# Patient Record
Sex: Male | Born: 1975 | Race: White | Hispanic: No | Marital: Single | Smoking: Former smoker
Health system: Southern US, Community
[De-identification: ages and names within clinical notes are randomized; demographics above are authoritative.]

## PROBLEM LIST (undated history)

## (undated) DIAGNOSIS — F32A Depression, unspecified: Secondary | ICD-10-CM

## (undated) DIAGNOSIS — F419 Anxiety disorder, unspecified: Secondary | ICD-10-CM

## (undated) DIAGNOSIS — J45909 Unspecified asthma, uncomplicated: Secondary | ICD-10-CM

## (undated) DIAGNOSIS — C801 Malignant (primary) neoplasm, unspecified: Secondary | ICD-10-CM

## (undated) DIAGNOSIS — J392 Other diseases of pharynx: Secondary | ICD-10-CM

## (undated) HISTORY — PX: TONSILLECTOMY: SUR1361

## (undated) HISTORY — PX: HIP SURGERY: SHX245

## (undated) HISTORY — PX: ELBOW SURGERY: SHX618

## (undated) HISTORY — PX: SHOULDER SURGERY: SHX246

## (undated) HISTORY — PX: PLANTAR FASCIA SURGERY: SHX746

---
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
## 2016-08-22 ENCOUNTER — Emergency Department (HOSPITAL_COMMUNITY): Payer: Commercial Managed Care - PPO

## 2016-08-22 ENCOUNTER — Encounter (HOSPITAL_COMMUNITY): Payer: Self-pay

## 2016-08-22 ENCOUNTER — Inpatient Hospital Stay (HOSPITAL_COMMUNITY)
Admission: EM | Admit: 2016-08-22 | Discharge: 2016-08-25 | DRG: 083 | Disposition: A | Discharge status: Home / Self Care | Payer: Commercial Managed Care - PPO | Attending: Surgery | Admitting: Surgery

## 2016-08-22 DIAGNOSIS — S0101XA Laceration without foreign body of scalp, initial encounter: Secondary | ICD-10-CM | POA: Diagnosis present

## 2016-08-22 DIAGNOSIS — S42112A Displaced fracture of body of scapula, left shoulder, initial encounter for closed fracture: Secondary | ICD-10-CM | POA: Diagnosis present

## 2016-08-22 DIAGNOSIS — S40212A Abrasion of left shoulder, initial encounter: Secondary | ICD-10-CM | POA: Diagnosis present

## 2016-08-22 DIAGNOSIS — Y908 Blood alcohol level of 240 mg/100 ml or more: Secondary | ICD-10-CM | POA: Diagnosis present

## 2016-08-22 DIAGNOSIS — R402412 Glasgow coma scale score 13-15, at arrival to emergency department: Secondary | ICD-10-CM | POA: Diagnosis present

## 2016-08-22 DIAGNOSIS — F10229 Alcohol dependence with intoxication, unspecified: Secondary | ICD-10-CM | POA: Diagnosis present

## 2016-08-22 DIAGNOSIS — W134XXA Fall from, out of or through window, initial encounter: Secondary | ICD-10-CM | POA: Diagnosis present

## 2016-08-22 DIAGNOSIS — S06339A Contusion and laceration of cerebrum, unspecified, with loss of consciousness of unspecified duration, initial encounter: Secondary | ICD-10-CM | POA: Diagnosis present

## 2016-08-22 DIAGNOSIS — F10129 Alcohol abuse with intoxication, unspecified: Secondary | ICD-10-CM | POA: Diagnosis present

## 2016-08-22 DIAGNOSIS — Z79899 Other long term (current) drug therapy: Secondary | ICD-10-CM | POA: Diagnosis not present

## 2016-08-22 DIAGNOSIS — R52 Pain, unspecified: Secondary | ICD-10-CM | POA: Diagnosis present

## 2016-08-22 DIAGNOSIS — E872 Acidosis, unspecified: Secondary | ICD-10-CM

## 2016-08-22 DIAGNOSIS — F10929 Alcohol use, unspecified with intoxication, unspecified: Secondary | ICD-10-CM

## 2016-08-22 DIAGNOSIS — S42102A Fracture of unspecified part of scapula, left shoulder, initial encounter for closed fracture: Secondary | ICD-10-CM

## 2016-08-22 DIAGNOSIS — F17210 Nicotine dependence, cigarettes, uncomplicated: Secondary | ICD-10-CM | POA: Diagnosis present

## 2016-08-22 DIAGNOSIS — S0633AA Contusion and laceration of cerebrum, unspecified, with loss of consciousness status unknown, initial encounter: Secondary | ICD-10-CM | POA: Diagnosis present

## 2016-08-22 DIAGNOSIS — F1290 Cannabis use, unspecified, uncomplicated: Secondary | ICD-10-CM | POA: Diagnosis not present

## 2016-08-22 DIAGNOSIS — Z79891 Long term (current) use of opiate analgesic: Secondary | ICD-10-CM | POA: Diagnosis not present

## 2016-08-22 DIAGNOSIS — S0990XA Unspecified injury of head, initial encounter: Secondary | ICD-10-CM

## 2016-08-22 DIAGNOSIS — S06361A Traumatic hemorrhage of cerebrum, unspecified, with loss of consciousness of 30 minutes or less, initial encounter: Secondary | ICD-10-CM

## 2016-08-22 HISTORY — DX: Anxiety disorder, unspecified: F41.9

## 2016-08-22 HISTORY — DX: Unspecified asthma, uncomplicated: J45.909

## 2016-08-22 LAB — I-STAT CG4 LACTIC ACID, ED: LACTIC ACID, VENOUS: 4.25 mmol/L — AB (ref 0.5–1.9)

## 2016-08-22 LAB — CBC
HCT: 45.8 % (ref 39.0–52.0)
Hemoglobin: 16.5 g/dL (ref 13.0–17.0)
MCH: 34.6 pg — ABNORMAL HIGH (ref 26.0–34.0)
MCHC: 36 g/dL (ref 30.0–36.0)
MCV: 96 fL (ref 78.0–100.0)
PLATELETS: 118 10*3/uL — AB (ref 150–400)
RBC: 4.77 MIL/uL (ref 4.22–5.81)
RDW: 12.9 % (ref 11.5–15.5)
WBC: 4 10*3/uL (ref 4.0–10.5)

## 2016-08-22 LAB — COMPREHENSIVE METABOLIC PANEL
ALT: 136 U/L — AB (ref 17–63)
AST: 157 U/L — ABNORMAL HIGH (ref 15–41)
Albumin: 4.7 g/dL (ref 3.5–5.0)
Alkaline Phosphatase: 56 U/L (ref 38–126)
Anion gap: 16 — ABNORMAL HIGH (ref 5–15)
BUN: <5 mg/dL — ABNORMAL LOW (ref 6–20)
CALCIUM: 8.9 mg/dL (ref 8.9–10.3)
CHLORIDE: 100 mmol/L — AB (ref 101–111)
CO2: 24 mmol/L (ref 22–32)
CREATININE: 0.85 mg/dL (ref 0.61–1.24)
GFR calc non Af Amer: >60 mL/min (ref >60)
Glucose, Bld: 130 mg/dL — ABNORMAL HIGH (ref 65–99)
Potassium: 3.6 mmol/L (ref 3.5–5.1)
Sodium: 140 mmol/L (ref 135–145)
TOTAL PROTEIN: 7.2 g/dL (ref 6.5–8.1)
Total Bilirubin: 0.3 mg/dL (ref 0.3–1.2)

## 2016-08-22 LAB — I-STAT CHEM 8, ED
BUN: <3 mg/dL — ABNORMAL LOW (ref 6–20)
Calcium, Ion: 0.93 mmol/L — ABNORMAL LOW (ref 1.15–1.40)
Chloride: 99 mmol/L — ABNORMAL LOW (ref 101–111)
Creatinine, Ser: 1.3 mg/dL — ABNORMAL HIGH (ref 0.61–1.24)
GLUCOSE: 135 mg/dL — AB (ref 65–99)
HCT: 48 % (ref 39.0–52.0)
HEMOGLOBIN: 16.3 g/dL (ref 13.0–17.0)
POTASSIUM: 3.4 mmol/L — AB (ref 3.5–5.1)
Sodium: 139 mmol/L (ref 135–145)
TCO2: 25 mmol/L (ref 0–100)

## 2016-08-22 LAB — SAMPLE TO BLOOD BANK

## 2016-08-22 LAB — PROTIME-INR
INR: 0.89
PROTHROMBIN TIME: 12 s (ref 11.4–15.2)

## 2016-08-22 LAB — ETHANOL: ALCOHOL ETHYL (B): 392 mg/dL — AB (ref <5)

## 2016-08-22 MED ORDER — LORAZEPAM 2 MG/ML IJ SOLN
0.0000 mg | Freq: Two times a day (BID) | INTRAMUSCULAR | Status: DC
Start: 2016-08-25 — End: 2016-08-25

## 2016-08-22 MED ORDER — TETANUS-DIPHTH-ACELL PERTUSSIS 5-2.5-18.5 LF-MCG/0.5 IM SUSP
0.5000 mL | Freq: Once | INTRAMUSCULAR | Status: AC
  Administered 2016-08-22: 0.5 mL via INTRAMUSCULAR
  Filled 2016-08-22: qty 0.5

## 2016-08-22 MED ORDER — PANTOPRAZOLE SODIUM 40 MG PO TBEC
40.0000 mg | DELAYED_RELEASE_TABLET | Freq: Every day | ORAL | Status: DC
  Administered 2016-08-23 – 2016-08-25 (×3): 40 mg via ORAL
  Filled 2016-08-22 (×3): qty 1

## 2016-08-22 MED ORDER — ADULT MULTIVITAMIN W/MINERALS CH
1.0000 | ORAL_TABLET | Freq: Every day | ORAL | Status: DC
  Administered 2016-08-23 – 2016-08-25 (×3): 1 via ORAL
  Filled 2016-08-22 (×3): qty 1

## 2016-08-22 MED ORDER — ONDANSETRON HCL 4 MG PO TABS: 4.0000 mg | ORAL_TABLET | Freq: Four times a day (QID) | ORAL | Status: DC | PRN

## 2016-08-22 MED ORDER — POTASSIUM CHLORIDE IN NACL 20-0.9 MEQ/L-% IV SOLN
INTRAVENOUS | Status: DC
  Administered 2016-08-23 – 2016-08-24 (×5): via INTRAVENOUS
  Administered 2016-08-24: 100 mL/h via INTRAVENOUS
  Filled 2016-08-22 (×5): qty 1000

## 2016-08-22 MED ORDER — VITAMIN B-1 100 MG PO TABS
100.0000 mg | ORAL_TABLET | Freq: Every day | ORAL | Status: DC
  Administered 2016-08-23 – 2016-08-25 (×3): 100 mg via ORAL
  Filled 2016-08-22 (×3): qty 1

## 2016-08-22 MED ORDER — PANTOPRAZOLE SODIUM 40 MG IV SOLR: 40.0000 mg | Freq: Every day | INTRAVENOUS | Status: DC

## 2016-08-22 MED ORDER — LORAZEPAM 1 MG PO TABS
1.0000 mg | ORAL_TABLET | Freq: Four times a day (QID) | ORAL | Status: DC | PRN
  Administered 2016-08-25: 1 mg via ORAL
  Filled 2016-08-22: qty 1

## 2016-08-22 MED ORDER — ONDANSETRON HCL 4 MG/2ML IJ SOLN
4.0000 mg | Freq: Four times a day (QID) | INTRAMUSCULAR | Status: DC | PRN
  Administered 2016-08-23 (×2): 4 mg via INTRAVENOUS
  Filled 2016-08-22 (×2): qty 2

## 2016-08-22 MED ORDER — LORAZEPAM 2 MG/ML IJ SOLN
0.0000 mg | Freq: Four times a day (QID) | INTRAMUSCULAR | Status: AC
Start: 2016-08-23 — End: 2016-08-24

## 2016-08-22 MED ORDER — FOLIC ACID 1 MG PO TABS
1.0000 mg | ORAL_TABLET | Freq: Every day | ORAL | Status: DC
  Administered 2016-08-23 – 2016-08-25 (×3): 1 mg via ORAL
  Filled 2016-08-22 (×3): qty 1

## 2016-08-22 MED ORDER — LORAZEPAM 2 MG/ML IJ SOLN: 1.0000 mg | Freq: Four times a day (QID) | INTRAMUSCULAR | Status: DC | PRN

## 2016-08-22 MED ORDER — SODIUM CHLORIDE 0.9 % IV BOLUS (SEPSIS)
1000.0000 mL | Freq: Once | INTRAVENOUS | Status: AC
  Administered 2016-08-22: 1000 mL via INTRAVENOUS

## 2016-08-22 MED ORDER — ONDANSETRON HCL 4 MG/2ML IJ SOLN
4.0000 mg | Freq: Once | INTRAMUSCULAR | Status: AC
  Administered 2016-08-22: 4 mg via INTRAVENOUS
  Filled 2016-08-22: qty 2

## 2016-08-22 MED ORDER — MORPHINE SULFATE (PF) 4 MG/ML IV SOLN
2.0000 mg | INTRAVENOUS | Status: DC | PRN
  Administered 2016-08-23: 2 mg via INTRAVENOUS
  Administered 2016-08-23: 4 mg via INTRAVENOUS
  Administered 2016-08-23: 2 mg via INTRAVENOUS
  Administered 2016-08-23 (×2): 4 mg via INTRAVENOUS
  Filled 2016-08-22 (×5): qty 1

## 2016-08-22 MED ORDER — THIAMINE HCL 100 MG/ML IJ SOLN: 100.0000 mg | Freq: Every day | INTRAMUSCULAR | Status: DC

## 2016-08-22 NOTE — ED Notes (Signed)
I Stat Lactic Acid results shown to Dr. Jearld Pies.

## 2016-08-22 NOTE — ED Notes (Signed)
Pt taken to CT by this RN and Ecologist.

## 2016-08-22 NOTE — ED Provider Notes (Signed)
[facility] DEPT Provider Note   CSN: [id number] Arrival date & time: 08/22/16  2133     History   Chief Complaint Chief Complaint  Patient presents with  . Fall  . Head Injury    HPI Johnny Mata is a 41 y.o. male.  The history is provided by the patient and the EMS personnel.  Fall  This is a new problem. The current episode started less than 1 hour ago. The problem occurs constantly. The problem has not changed since onset.Associated symptoms include headaches. Pertinent negatives include no chest pain, no abdominal pain and no shortness of breath. Nothing aggravates the symptoms. Nothing relieves the symptoms. He has tried nothing for the symptoms. The treatment provided no relief.  Head Injury   The incident occurred less than 1 hour ago. He came to the ER via EMS. The injury mechanism was a fall. He lost consciousness for a period of 1 to 5 minutes. The volume of blood lost was minimal. The quality of the pain is described as sharp. The pain is at a severity of 7/10. The pain is moderate. The pain has been constant since the injury. Associated symptoms include memory loss. Pertinent negatives include no numbness, no blurred vision, no vomiting, no tinnitus, no disorientation and no weakness. He was found lethargic by EMS personnel. Treatment on the scene included a c-collar and a backboard. He has tried nothing for the symptoms.    History reviewed. No pertinent past medical history.  Patient Active Problem List   Diagnosis Date Noted  . Cerebral contusion ([place]) 08/22/2016    History reviewed. No pertinent surgical history.     Home Medications    Prior to Admission medications   Medication Sig Start Date End Date Taking? Authorizing Provider  Diphenhydramine-APAP, sleep, ([facility] PM) 38-500 MG PACK Take 1 packet by mouth See admin instructions. Take 1 packet by mouth upon arrival home at night, then take another packet about an hour later   Yes Historical Provider,  MD    Family History History reviewed. No pertinent family history.  Social History Social History  Substance Use Topics  . Smoking status: Current Every Day Smoker    Types: Cigarettes  . Smokeless tobacco: Not on file  . Alcohol use Yes     Comment: EVERY DAY     Allergies   Patient has no known allergies.   Review of Systems Review of Systems  Constitutional: Negative for chills and fever.  HENT: Negative for ear pain, sore throat and tinnitus.   Eyes: Negative for blurred vision, pain and visual disturbance.  Respiratory: Negative for cough and shortness of breath.   Cardiovascular: Negative for chest pain and palpitations.  Gastrointestinal: Negative for abdominal pain and vomiting.  Genitourinary: Negative for dysuria and hematuria.  Musculoskeletal: Positive for arthralgias and myalgias. Negative for back pain.  Skin: Positive for wound. Negative for color change and rash.  Neurological: Positive for headaches. Negative for seizures, syncope, weakness and numbness.  Psychiatric/Behavioral: Positive for memory loss.  All other systems reviewed and are negative.    Physical Exam Updated Vital Signs BP 127/85   Pulse (!) 110   Temp 97.4 F (36.3 C) (Oral)   Resp 13   Ht 5' 11.75" (1.822 m)   Wt 74.8 kg   SpO2 96%   BMI 22.53 kg/m   Physical Exam  Constitutional: He appears well-developed and well-nourished.  HENT:  Head: Normocephalic. Head is with laceration.    Right Ear: No  hemotympanum.  Left Ear: No hemotympanum.  Eyes: Conjunctivae are normal.  Cardiovascular: Normal rate and regular rhythm.   No murmur heard. Pulmonary/Chest: Effort normal and breath sounds normal. No respiratory distress.  Abdominal: Soft. There is no tenderness.  Musculoskeletal: He exhibits no edema.       Left shoulder: He exhibits decreased range of motion, tenderness, bony tenderness, laceration (abrasion to posterior shoulder) and pain. He exhibits no swelling and no  effusion.       Cervical back: He exhibits no tenderness, no bony tenderness and no pain.       Thoracic back: He exhibits no tenderness, no bony tenderness and no pain.       Lumbar back: He exhibits no tenderness, no bony tenderness and no pain.  Neurological: He is alert. He has normal strength. No cranial nerve deficit or sensory deficit. GCS eye subscore is 4. GCS verbal subscore is 5. GCS motor subscore is 6.  Skin: Skin is warm and dry. Abrasion noted.  Psychiatric: He has a normal mood and affect. His speech is normal. He exhibits abnormal recent memory.  Nursing note and vitals reviewed.    ED Treatments / Results  Labs (all labs ordered are listed, but only abnormal results are displayed) Labs Reviewed  COMPREHENSIVE METABOLIC PANEL - Abnormal; Notable for the following:       Result Value   Chloride 100 (*)    Glucose, Bld 130 (*)    BUN <5 (*)    AST 157 (*)    ALT 136 (*)    Anion gap 16 (*)    All other components within normal limits  CBC - Abnormal; Notable for the following:    MCH 34.6 (*)    Platelets 118 (*)    All other components within normal limits  ETHANOL - Abnormal; Notable for the following:    Alcohol, Ethyl (B) 392 (*)    All other components within normal limits  I-STAT CHEM 8, ED - Abnormal; Notable for the following:    Potassium 3.4 (*)    Chloride 99 (*)    BUN <3 (*)    Creatinine, Ser 1.30 (*)    Glucose, Bld 135 (*)    Calcium, Ion 0.93 (*)    All other components within normal limits  I-STAT CG4 LACTIC ACID, ED - Abnormal; Notable for the following:    Lactic Acid, Venous 4.25 (*)    All other components within normal limits  PROTIME-INR  CDS SEROLOGY  URINALYSIS, ROUTINE W REFLEX MICROSCOPIC  HIV ANTIBODY (ROUTINE TESTING)  BASIC METABOLIC PANEL  CBC  PROTIME-INR  SAMPLE TO BLOOD BANK    EKG  EKG Interpretation None       Radiology Ct Head Wo Contrast  Result Date: 08/22/2016 CLINICAL DATA:  Fall out of window 15  feet onto head, loss of consciousness EXAM: CT HEAD WITHOUT CONTRAST CT CERVICAL SPINE WITHOUT CONTRAST TECHNIQUE: Multidetector CT imaging of the head and cervical spine was performed following the standard protocol without intravenous contrast. Multiplanar CT image reconstructions of the cervical spine were also generated. COMPARISON:  None. FINDINGS: CT HEAD FINDINGS Brain: 7 mm subcortical hemorrhage in the medial right frontal lobe (series 3/image 24), worrisome for parenchymal contusion. No evidence of acute infarction, hydrocephalus, extra-axial collection or mass lesion/mass effect. Vascular: No hyperdense vessel or unexpected calcification. Skull: Normal. Negative for fracture or focal lesion. Sinuses/Orbits: Partial opacification of the right maxillary sinus. Visualized paranasal sinuses and mastoid air cells are otherwise  clear. Other: Soft tissue swelling/hematoma/laceration overlying the left parietal bone (series 3/ image 21). CT CERVICAL SPINE FINDINGS Alignment: Normal cervical lordosis. Skull base and vertebrae: No acute fracture. No primary bone lesion or focal pathologic process. Soft tissues and spinal canal: No prevertebral fluid or swelling. No visible canal hematoma. Disc levels:  Mild degenerative changes. Spinal canal is patent. Upper chest: Visualized lung apices are clear. Other: Visualized thyroid is unremarkable. IMPRESSION: 7 mm subcortical hemorrhage in the medial right frontal lobe, worrisome for parenchymal contusion. Soft tissue swelling/ hematoma/laceration overlying the left parietal bone. No evidence of calvarial fracture. No evidence of acute traumatic injury to the cervical spine. Critical Value/emergent results were called by telephone at the time of interpretation on 08/22/2016 at 10:32 pm to Dr. Duffy Bruce , who verbally acknowledged these results. Electronically Signed   By: Julian Hy M.D.   On: 08/22/2016 22:35   Ct Cervical Spine Wo Contrast  Result Date:  08/22/2016 CLINICAL DATA:  Fall out of window 15 feet onto head, loss of consciousness EXAM: CT HEAD WITHOUT CONTRAST CT CERVICAL SPINE WITHOUT CONTRAST TECHNIQUE: Multidetector CT imaging of the head and cervical spine was performed following the standard protocol without intravenous contrast. Multiplanar CT image reconstructions of the cervical spine were also generated. COMPARISON:  None. FINDINGS: CT HEAD FINDINGS Brain: 7 mm subcortical hemorrhage in the medial right frontal lobe (series 3/image 24), worrisome for parenchymal contusion. No evidence of acute infarction, hydrocephalus, extra-axial collection or mass lesion/mass effect. Vascular: No hyperdense vessel or unexpected calcification. Skull: Normal. Negative for fracture or focal lesion. Sinuses/Orbits: Partial opacification of the right maxillary sinus. Visualized paranasal sinuses and mastoid air cells are otherwise clear. Other: Soft tissue swelling/hematoma/laceration overlying the left parietal bone (series 3/ image 21). CT CERVICAL SPINE FINDINGS Alignment: Normal cervical lordosis. Skull base and vertebrae: No acute fracture. No primary bone lesion or focal pathologic process. Soft tissues and spinal canal: No prevertebral fluid or swelling. No visible canal hematoma. Disc levels:  Mild degenerative changes. Spinal canal is patent. Upper chest: Visualized lung apices are clear. Other: Visualized thyroid is unremarkable. IMPRESSION: 7 mm subcortical hemorrhage in the medial right frontal lobe, worrisome for parenchymal contusion. Soft tissue swelling/ hematoma/laceration overlying the left parietal bone. No evidence of calvarial fracture. No evidence of acute traumatic injury to the cervical spine. Critical Value/emergent results were called by telephone at the time of interpretation on 08/22/2016 at 10:32 pm to Dr. Duffy Bruce , who verbally acknowledged these results. Electronically Signed   By: Julian Hy M.D.   On: 08/22/2016 22:35    Dg Chest Portable 1 View  Result Date: 08/22/2016 CLINICAL DATA:  Status post fall, left shoulder pain EXAM: PORTABLE CHEST 1 VIEW COMPARISON:  None. FINDINGS: Lungs are clear.  No pleural effusion or pneumothorax. The heart is normal size. IMPRESSION: No evidence of acute cardiopulmonary disease. Electronically Signed   By: Julian Hy M.D.   On: 08/22/2016 22:07   Dg Shoulder Left Port  Result Date: 08/22/2016 CLINICAL DATA:  Status post fall, left shoulder pain EXAM: LEFT SHOULDER - 1 VIEW COMPARISON:  None. FINDINGS: Left shoulder is within normal limits. Mild displaced inferior scapular fracture. Visualized left lung is clear. IMPRESSION: Mildly displaced inferior scapular fracture. Electronically Signed   By: Julian Hy M.D.   On: 08/22/2016 22:07    Procedures .Marland KitchenLaceration Repair Date/Time: 08/23/2016 12:03 AM Performed by: Esaw Grandchild Authorized by: Esaw Grandchild   Consent:    Consent obtained:  Verbal  Consent given by:  Patient   Risks discussed:  Infection and pain   Alternatives discussed:  No treatment Anesthesia (see MAR for exact dosages):    Anesthesia method:  None Laceration details:    Location:  Scalp   Scalp location:  Occipital   Length (cm):  4 Repair type:    Repair type:  Simple Pre-procedure details:    Preparation:  Imaging obtained to evaluate for foreign bodies Exploration:    Hemostasis achieved with:  Direct pressure   Contaminated: no   Treatment:    Area cleansed with:  Saline   Amount of cleaning:  Extensive   Irrigation solution:  Sterile saline   Irrigation method:  Syringe   Visualized foreign bodies/material removed: no   Skin repair:    Repair method:  Staples   Number of staples:  8 Approximation:    Approximation:  Close   Vermilion border: well-aligned   Post-procedure details:    Dressing:  Non-adherent dressing and bulky dressing   Patient tolerance of procedure:  Tolerated well, no immediate  complications   (including critical care time)  Medications Ordered in ED Medications  LORazepam (ATIVAN) tablet 1 mg (not administered)    Or  LORazepam (ATIVAN) injection 1 mg (not administered)  thiamine (VITAMIN B-1) tablet 100 mg (not administered)    Or  thiamine (B-1) injection 100 mg (not administered)  folic acid (FOLVITE) tablet 1 mg (not administered)  multivitamin with minerals tablet 1 tablet (not administered)  0.9 % NaCl with KCl 20 mEq/ L  infusion (not administered)  morphine 4 MG/ML injection 2-4 mg (not administered)  ondansetron (ZOFRAN) tablet 4 mg (not administered)    Or  ondansetron (ZOFRAN) injection 4 mg (not administered)  pantoprazole (PROTONIX) EC tablet 40 mg (not administered)    Or  pantoprazole (PROTONIX) injection 40 mg (not administered)  LORazepam (ATIVAN) injection 0-4 mg (not administered)    Followed by  LORazepam (ATIVAN) injection 0-4 mg (not administered)  Tdap (BOOSTRIX) injection 0.5 mL (0.5 mLs Intramuscular Given 08/22/16 2219)  ondansetron (ZOFRAN) injection 4 mg (4 mg Intravenous Given 08/22/16 2219)  sodium chloride 0.9 % bolus 1,000 mL (0 mLs Intravenous Stopped 08/22/16 2330)     Initial Impression / Assessment and Plan / ED Course  I have reviewed the triage vital signs and the nursing notes.  Pertinent labs & imaging results that were available during my care of the patient were reviewed by me and considered in my medical decision making (see chart for details).     41 year old male presents in the setting of fall. The patient was witnessed to fall out of second story window falling on back of head with positive loss of consciousness. Patient did not have any seizure-like activity and returned within normal limits quickly with EMS. Glucose within normal limits but patient smelled of alcohol. Hemodynamic stable in route.  On arrival patient with obvious laceration to posterior scalp. Wound repaired as noted above. Patient GCS of 15  but smells of alcohol. Patient complaining of left shoulder pain. Abdomen soft nontender nondistended. Abrasion to left shoulder and no other signs of obvious trauma. Trauma team was at bedside on initial evaluation and assisted with the care of this patient.  Laboratory analysis revealed alcohol intoxication as well as lactic acid acidosis. Patient given IV fluids. Additionally patient given tetanus. CT head revealed intraparenchymal contusion. Neurosurgery consultation and will evaluate patient in hospital. Additionally patient did have scapular fracture and discussed case with orthopedics who  will see patient in the hospital.  The patient remained stable in the emergency department will be admitted to trauma service for further management of his condition. Stable at time of transfer of care.  Attending has seen and available patient and Dr. Ellender Hose is in agreement with plan.  Final Clinical Impressions(s) / ED Diagnoses   Final diagnoses:  Pain  Cerebral contusion [facility])    New Prescriptions New Prescriptions   No medications on file     Esaw Grandchild, MD 08/23/16 0004    Duffy Bruce, MD 08/23/16 1210

## 2016-08-22 NOTE — ED Notes (Signed)
ED Provider at bedside. 

## 2016-08-22 NOTE — ED Triage Notes (Signed)
Per EMS, pt seen by bystanders falling out of a window 15 feet onto his head with positive LOC. Pt does not recall falling from the window. Pt has 4-5cm lac to back of head with bleeding. Gauze in place. PERRL. Pt alert and oriented x4. VSS. VS 160/110, HR 92, CBG 138, RR 16, 98% on RA. ETOH on board. Pt has abrasion to left posterior shoulder and complains of left shoulder pain.

## 2016-08-22 NOTE — H&P (Signed)
History   Johnny Mata is an 41 y.o. male.   Chief Complaint:  Chief Complaint  Patient presents with  . Fall  . Head Injury    41 yo alcoholic male, fell two stories out of a window.  +LOC. Bleeding at the scene.Head lac.  Drinks daily.  Admits to drinking today.   Trauma Mechanism of injury: fall Injury location: shoulder/arm and head/neck Injury location detail: scalp and L shoulder Incident location: home (apartment)    History reviewed. No pertinent past medical history.  History reviewed. No pertinent surgical history.  History reviewed. No pertinent family history. Social History:  reports that he has been smoking Cigarettes.  He does not have any smokeless tobacco history on file. He reports that he drinks alcohol. He reports that he uses drugs, including Marijuana.  Allergies  No Known Allergies  Home Medications   (Not in a hospital admission)  Trauma Course   Results for orders placed or performed during the hospital encounter of 08/22/16 (from the past 48 hour(s))  Comprehensive metabolic panel     Status: Abnormal   Collection Time: 08/22/16  9:42 PM  Result Value Ref Range   Sodium 140 135 - 145 mmol/L   Potassium 3.6 3.5 - 5.1 mmol/L   Chloride 100 (L) 101 - 111 mmol/L   CO2 24 22 - 32 mmol/L   Glucose, Bld 130 (H) 65 - 99 mg/dL   BUN <5 (L) 6 - 20 mg/dL   Creatinine, Ser 0.85 0.61 - 1.24 mg/dL   Calcium 8.9 8.9 - 10.3 mg/dL   Total Protein 7.2 6.5 - 8.1 g/dL   Albumin 4.7 3.5 - 5.0 g/dL   AST 157 (H) 15 - 41 U/L   ALT 136 (H) 17 - 63 U/L   Alkaline Phosphatase 56 38 - 126 U/L   Total Bilirubin 0.3 0.3 - 1.2 mg/dL   GFR calc non Af Amer >60 >60 mL/min   GFR calc Af Amer >60 >60 mL/min    Comment: (NOTE) The eGFR has been calculated using the CKD EPI equation. This calculation has not been validated in all clinical situations. eGFR's persistently <60 mL/min signify possible Chronic Kidney Disease.    Anion gap 16 (H) 5 - 15  CBC      Status: Abnormal   Collection Time: 08/22/16  9:42 PM  Result Value Ref Range   WBC 4.0 4.0 - 10.5 K/uL   RBC 4.77 4.22 - 5.81 MIL/uL   Hemoglobin 16.5 13.0 - 17.0 g/dL   HCT 45.8 39.0 - 52.0 %   MCV 96.0 78.0 - 100.0 fL   MCH 34.6 (H) 26.0 - 34.0 pg   MCHC 36.0 30.0 - 36.0 g/dL   RDW 12.9 11.5 - 15.5 %   Platelets 118 (L) 150 - 400 K/uL    Comment: REPEATED TO VERIFY SPECIMEN CHECKED FOR CLOTS PLATELET COUNT CONFIRMED BY SMEAR   Ethanol     Status: Abnormal   Collection Time: 08/22/16  9:42 PM  Result Value Ref Range   Alcohol, Ethyl (B) 392 (HH) <5 mg/dL    Comment:        LOWEST DETECTABLE LIMIT FOR SERUM ALCOHOL IS 5 mg/dL FOR MEDICAL PURPOSES ONLY CRITICAL RESULT CALLED TO, READ BACK BY AND VERIFIED WITH: CHERVENKA,K RN 08/22/2016 2250 JORDANS   Protime-INR     Status: None   Collection Time: 08/22/16  9:42 PM  Result Value Ref Range   Prothrombin Time 12.0 11.4 - 15.2 seconds  INR 0.89   Sample to Blood Bank     Status: None   Collection Time: 08/22/16  9:42 PM  Result Value Ref Range   Blood Bank Specimen SAMPLE AVAILABLE FOR TESTING    Sample Expiration 08/23/2016   I-Stat Chem 8, ED     Status: Abnormal   Collection Time: 08/22/16 10:00 PM  Result Value Ref Range   Sodium 139 135 - 145 mmol/L   Potassium 3.4 (L) 3.5 - 5.1 mmol/L   Chloride 99 (L) 101 - 111 mmol/L   BUN <3 (L) 6 - 20 mg/dL   Creatinine, Ser 1.30 (H) 0.61 - 1.24 mg/dL   Glucose, Bld 135 (H) 65 - 99 mg/dL   Calcium, Ion 0.93 (L) 1.15 - 1.40 mmol/L   TCO2 25 0 - 100 mmol/L   Hemoglobin 16.3 13.0 - 17.0 g/dL   HCT 48.0 39.0 - 52.0 %  I-Stat CG4 Lactic Acid, ED     Status: Abnormal   Collection Time: 08/22/16 10:01 PM  Result Value Ref Range   Lactic Acid, Venous 4.25 (HH) 0.5 - 1.9 mmol/L   Comment NOTIFIED PHYSICIAN    Ct Head Wo Contrast  Result Date: 08/22/2016 CLINICAL DATA:  Fall out of window 15 feet onto head, loss of consciousness EXAM: CT HEAD WITHOUT CONTRAST CT CERVICAL SPINE  WITHOUT CONTRAST TECHNIQUE: Multidetector CT imaging of the head and cervical spine was performed following the standard protocol without intravenous contrast. Multiplanar CT image reconstructions of the cervical spine were also generated. COMPARISON:  None. FINDINGS: CT HEAD FINDINGS Brain: 7 mm subcortical hemorrhage in the medial right frontal lobe (series 3/image 24), worrisome for parenchymal contusion. No evidence of acute infarction, hydrocephalus, extra-axial collection or mass lesion/mass effect. Vascular: No hyperdense vessel or unexpected calcification. Skull: Normal. Negative for fracture or focal lesion. Sinuses/Orbits: Partial opacification of the right maxillary sinus. Visualized paranasal sinuses and mastoid air cells are otherwise clear. Other: Soft tissue swelling/hematoma/laceration overlying the left parietal bone (series 3/ image 21). CT CERVICAL SPINE FINDINGS Alignment: Normal cervical lordosis. Skull base and vertebrae: No acute fracture. No primary bone lesion or focal pathologic process. Soft tissues and spinal canal: No prevertebral fluid or swelling. No visible canal hematoma. Disc levels:  Mild degenerative changes. Spinal canal is patent. Upper chest: Visualized lung apices are clear. Other: Visualized thyroid is unremarkable. IMPRESSION: 7 mm subcortical hemorrhage in the medial right frontal lobe, worrisome for parenchymal contusion. Soft tissue swelling/ hematoma/laceration overlying the left parietal bone. No evidence of calvarial fracture. No evidence of acute traumatic injury to the cervical spine. Critical Value/emergent results were called by telephone at the time of interpretation on 08/22/2016 at 10:32 pm to Dr. Duffy Bruce , who verbally acknowledged these results. Electronically Signed   By: Julian Hy M.D.   On: 08/22/2016 22:35   Ct Cervical Spine Wo Contrast  Result Date: 08/22/2016 CLINICAL DATA:  Fall out of window 15 feet onto head, loss of consciousness  EXAM: CT HEAD WITHOUT CONTRAST CT CERVICAL SPINE WITHOUT CONTRAST TECHNIQUE: Multidetector CT imaging of the head and cervical spine was performed following the standard protocol without intravenous contrast. Multiplanar CT image reconstructions of the cervical spine were also generated. COMPARISON:  None. FINDINGS: CT HEAD FINDINGS Brain: 7 mm subcortical hemorrhage in the medial right frontal lobe (series 3/image 24), worrisome for parenchymal contusion. No evidence of acute infarction, hydrocephalus, extra-axial collection or mass lesion/mass effect. Vascular: No hyperdense vessel or unexpected calcification. Skull: Normal. Negative for fracture or focal  lesion. Sinuses/Orbits: Partial opacification of the right maxillary sinus. Visualized paranasal sinuses and mastoid air cells are otherwise clear. Other: Soft tissue swelling/hematoma/laceration overlying the left parietal bone (series 3/ image 21). CT CERVICAL SPINE FINDINGS Alignment: Normal cervical lordosis. Skull base and vertebrae: No acute fracture. No primary bone lesion or focal pathologic process. Soft tissues and spinal canal: No prevertebral fluid or swelling. No visible canal hematoma. Disc levels:  Mild degenerative changes. Spinal canal is patent. Upper chest: Visualized lung apices are clear. Other: Visualized thyroid is unremarkable. IMPRESSION: 7 mm subcortical hemorrhage in the medial right frontal lobe, worrisome for parenchymal contusion. Soft tissue swelling/ hematoma/laceration overlying the left parietal bone. No evidence of calvarial fracture. No evidence of acute traumatic injury to the cervical spine. Critical Value/emergent results were called by telephone at the time of interpretation on 08/22/2016 at 10:32 pm to Dr. Duffy Bruce , who verbally acknowledged these results. Electronically Signed   By: Julian Hy M.D.   On: 08/22/2016 22:35   Dg Chest Portable 1 View  Result Date: 08/22/2016 CLINICAL DATA:  Status post fall,  left shoulder pain EXAM: PORTABLE CHEST 1 VIEW COMPARISON:  None. FINDINGS: Lungs are clear.  No pleural effusion or pneumothorax. The heart is normal size. IMPRESSION: No evidence of acute cardiopulmonary disease. Electronically Signed   By: Julian Hy M.D.   On: 08/22/2016 22:07   Dg Shoulder Left Port  Result Date: 08/22/2016 CLINICAL DATA:  Status post fall, left shoulder pain EXAM: LEFT SHOULDER - 1 VIEW COMPARISON:  None. FINDINGS: Left shoulder is within normal limits. Mild displaced inferior scapular fracture. Visualized left lung is clear. IMPRESSION: Mildly displaced inferior scapular fracture. Electronically Signed   By: Julian Hy M.D.   On: 08/22/2016 22:07    ROS  Blood pressure 133/86, pulse 98, temperature 97.4 F (36.3 C), temperature source Oral, resp. rate 18, height 5' 11.75" (1.822 m), weight 74.8 kg (165 lb), SpO2 95 %. Physical Exam  Vitals reviewed. Constitutional: He appears well-developed and well-nourished.  Bearded male with the smell of ETOH  HENT:  Head: Normocephalic.    Posterior scalp lac, 4 cm  Neck: Normal range of motion. Neck supple.  Cardiovascular: Normal rate and normal heart sounds.   Respiratory: Effort normal and breath sounds normal.      GI: Soft. Bowel sounds are normal.  Musculoskeletal:       Left shoulder: He exhibits decreased range of motion, tenderness and bony tenderness. He exhibits no swelling.  Neurological: He is alert. He has normal reflexes.  Skin: Skin is warm.  Psychiatric: He has a normal mood and affect. His behavior is normal. Judgment and thought content normal.     Assessment/Plan 7 mm subcortical hemorrhage left frontal area. Scapular fracture Alcoholism--on CIWA protocol  Teron Blais 08/22/2016, 11:24 PM   Procedures

## 2016-08-22 NOTE — ED Notes (Signed)
Delay in lab draw,  edp at bedside. 

## 2016-08-23 ENCOUNTER — Encounter (HOSPITAL_COMMUNITY): Payer: Self-pay

## 2016-08-23 ENCOUNTER — Inpatient Hospital Stay (HOSPITAL_COMMUNITY): Payer: Commercial Managed Care - PPO

## 2016-08-23 DIAGNOSIS — S42112A Displaced fracture of body of scapula, left shoulder, initial encounter for closed fracture: Secondary | ICD-10-CM

## 2016-08-23 LAB — URINALYSIS, ROUTINE W REFLEX MICROSCOPIC
Bacteria, UA: NONE SEEN
Bilirubin Urine: NEGATIVE
GLUCOSE, UA: NEGATIVE mg/dL
Ketones, ur: 5 mg/dL — AB
LEUKOCYTES UA: NEGATIVE
NITRITE: NEGATIVE
PH: 5 (ref 5.0–8.0)
Protein, ur: NEGATIVE mg/dL
SPECIFIC GRAVITY, URINE: 1.017 (ref 1.005–1.030)

## 2016-08-23 LAB — BASIC METABOLIC PANEL
Anion gap: 16 — ABNORMAL HIGH (ref 5–15)
CHLORIDE: 105 mmol/L (ref 101–111)
CO2: 21 mmol/L — AB (ref 22–32)
Calcium: 8.6 mg/dL — ABNORMAL LOW (ref 8.9–10.3)
Creatinine, Ser: 0.66 mg/dL (ref 0.61–1.24)
GFR calc Af Amer: >60 mL/min (ref >60)
GLUCOSE: 103 mg/dL — AB (ref 65–99)
POTASSIUM: 4 mmol/L (ref 3.5–5.1)
Sodium: 142 mmol/L (ref 135–145)

## 2016-08-23 LAB — LACTIC ACID, PLASMA: Lactic Acid, Venous: 1.1 mmol/L (ref 0.5–1.9)

## 2016-08-23 LAB — PROTIME-INR
INR: 0.99
Prothrombin Time: 13.1 seconds (ref 11.4–15.2)

## 2016-08-23 LAB — CBC
HEMATOCRIT: 41.1 % (ref 39.0–52.0)
HEMOGLOBIN: 14.3 g/dL (ref 13.0–17.0)
MCH: 33.2 pg (ref 26.0–34.0)
MCHC: 34.8 g/dL (ref 30.0–36.0)
MCV: 95.4 fL (ref 78.0–100.0)
Platelets: 112 10*3/uL — ABNORMAL LOW (ref 150–400)
RBC: 4.31 MIL/uL (ref 4.22–5.81)
RDW: 12.7 % (ref 11.5–15.5)
WBC: 9 10*3/uL (ref 4.0–10.5)

## 2016-08-23 LAB — HIV ANTIBODY (ROUTINE TESTING W REFLEX): HIV Screen 4th Generation wRfx: NONREACTIVE

## 2016-08-23 LAB — CDS SEROLOGY

## 2016-08-23 LAB — MRSA PCR SCREENING: MRSA by PCR: NEGATIVE

## 2016-08-23 MED ORDER — DIPHENHYDRAMINE HCL 12.5 MG/5ML PO ELIX
12.5000 mg | ORAL_SOLUTION | Freq: Four times a day (QID) | ORAL | Status: DC | PRN
  Filled 2016-08-23: qty 5

## 2016-08-23 MED ORDER — SODIUM CHLORIDE 0.9 % IV SOLN
500.0000 mg | Freq: Two times a day (BID) | INTRAVENOUS | Status: DC
  Administered 2016-08-23 – 2016-08-24 (×4): 500 mg via INTRAVENOUS
  Filled 2016-08-23 (×5): qty 5

## 2016-08-23 MED ORDER — NALOXONE HCL 0.4 MG/ML IJ SOLN: 0.4000 mg | INTRAMUSCULAR | Status: DC | PRN

## 2016-08-23 MED ORDER — DIPHENHYDRAMINE HCL 50 MG/ML IJ SOLN: 12.5000 mg | Freq: Four times a day (QID) | INTRAMUSCULAR | Status: DC | PRN

## 2016-08-23 MED ORDER — OXYCODONE-ACETAMINOPHEN 5-325 MG PO TABS
1.0000 | ORAL_TABLET | ORAL | Status: DC | PRN
  Administered 2016-08-23: 1 via ORAL
  Administered 2016-08-23: 2 via ORAL
  Filled 2016-08-23: qty 2
  Filled 2016-08-23: qty 1

## 2016-08-23 MED ORDER — GUAIFENESIN 100 MG/5ML PO SOLN
5.0000 mL | ORAL | Status: DC | PRN
  Administered 2016-08-23: 100 mg via ORAL
  Filled 2016-08-23: qty 5

## 2016-08-23 MED ORDER — SODIUM CHLORIDE 0.9% FLUSH: 9.0000 mL | INTRAVENOUS | Status: DC | PRN

## 2016-08-23 MED ORDER — HYDROMORPHONE HCL 1 MG/ML IJ SOLN
INTRAMUSCULAR | Status: AC
  Administered 2016-08-23: 1 mg via INTRAVENOUS
  Filled 2016-08-23: qty 1

## 2016-08-23 MED ORDER — ORAL CARE MOUTH RINSE
15.0000 mL | Freq: Two times a day (BID) | OROMUCOSAL | Status: DC
  Administered 2016-08-23 – 2016-08-24 (×3): 15 mL via OROMUCOSAL

## 2016-08-23 MED ORDER — ONDANSETRON HCL 4 MG/2ML IJ SOLN
4.0000 mg | Freq: Four times a day (QID) | INTRAMUSCULAR | Status: DC | PRN
Start: 2016-08-23 — End: 2016-08-24

## 2016-08-23 MED ORDER — HYDROMORPHONE 1 MG/ML IV SOLN
INTRAVENOUS | Status: DC
  Administered 2016-08-23: 22:00:00 via INTRAVENOUS
  Administered 2016-08-24: 1 mg via INTRAVENOUS
  Filled 2016-08-23 (×2): qty 25

## 2016-08-23 MED ORDER — HYDROMORPHONE HCL 1 MG/ML IJ SOLN
1.0000 mg | INTRAMUSCULAR | Status: DC | PRN
  Administered 2016-08-23 (×9): 1 mg via INTRAVENOUS
  Filled 2016-08-23 (×8): qty 1

## 2016-08-23 NOTE — Progress Notes (Signed)
Subjective: Pt awake  Complains of shoulder pain  Has no memory of what happened last night   Friend says he may have jumped out window  Heavy ETOH use   Objective: Vital signs in last 24 hours: Temp:  [97.4 F (36.3 C)-98.1 F (36.7 C)] 98.1 F (36.7 C) (04/08 0351) Pulse Rate:  [90-112] 103 (04/08 0700) Resp:  [10-27] 17 (04/08 0700) BP: (115-148)/(70-100) 133/78 (04/08 0700) SpO2:  [91 %-100 %] 97 % (04/08 0700) Weight:  [74.8 kg (165 lb)] 74.8 kg (165 lb) (04/07 2140)    Intake/Output from previous day: 04/07 0701 - 04/08 0700 In: 2490 [P.O.:790; I.V.:700; IV Piggyback:1000] Out: 500 [Urine:500] Intake/Output this shift: No intake/output data recorded.  Head: scalp laceration  not bleeding  Resp: clear to auscultation bilaterally Cardio: regular rate and rhythm, S1, S2 normal, no murmur, click, rub or gallop GI: soft, non-tender; bowel sounds normal; no masses,  no organomegaly Neurologic: Grossly normal   Left arm in sling   Lab Results:   Recent Labs  08/22/16 2142 08/22/16 2200 08/23/16 0111  WBC 4.0  --  9.0  HGB 16.5 16.3 14.3  HCT 45.8 48.0 41.1  PLT 118*  --  112*   BMET  Recent Labs  08/22/16 2142 08/22/16 2200 08/23/16 0111  NA 140 139 142  K 3.6 3.4* 4.0  CL 100* 99* 105  CO2 24  --  21*  GLUCOSE 130* 135* 103*  BUN <5* <3* <5*  CREATININE 0.85 1.30* 0.66  CALCIUM 8.9  --  8.6*   PT/INR  Recent Labs  08/22/16 2142 08/23/16 0111  LABPROT 12.0 13.1  INR 0.89 0.99   ABG No results for input(s): PHART, HCO3 in the last 72 hours.  Invalid input(s): PCO2, PO2  Studies/Results: Ct Head Wo Contrast  Result Date: 08/22/2016 CLINICAL DATA:  Fall out of window 15 feet onto head, loss of consciousness EXAM: CT HEAD WITHOUT CONTRAST CT CERVICAL SPINE WITHOUT CONTRAST TECHNIQUE: Multidetector CT imaging of the head and cervical spine was performed following the standard protocol without intravenous contrast. Multiplanar CT image  reconstructions of the cervical spine were also generated. COMPARISON:  None. FINDINGS: CT HEAD FINDINGS Brain: 7 mm subcortical hemorrhage in the medial right frontal lobe (series 3/image 24), worrisome for parenchymal contusion. No evidence of acute infarction, hydrocephalus, extra-axial collection or mass lesion/mass effect. Vascular: No hyperdense vessel or unexpected calcification. Skull: Normal. Negative for fracture or focal lesion. Sinuses/Orbits: Partial opacification of the right maxillary sinus. Visualized paranasal sinuses and mastoid air cells are otherwise clear. Other: Soft tissue swelling/hematoma/laceration overlying the left parietal bone (series 3/ image 21). CT CERVICAL SPINE FINDINGS Alignment: Normal cervical lordosis. Skull base and vertebrae: No acute fracture. No primary bone lesion or focal pathologic process. Soft tissues and spinal canal: No prevertebral fluid or swelling. No visible canal hematoma. Disc levels:  Mild degenerative changes. Spinal canal is patent. Upper chest: Visualized lung apices are clear. Other: Visualized thyroid is unremarkable. IMPRESSION: 7 mm subcortical hemorrhage in the medial right frontal lobe, worrisome for parenchymal contusion. Soft tissue swelling/ hematoma/laceration overlying the left parietal bone. No evidence of calvarial fracture. No evidence of acute traumatic injury to the cervical spine. Critical Value/emergent results were called by telephone at the time of interpretation on 08/22/2016 at 10:32 pm to Dr. Duffy Bruce , who verbally acknowledged these results. Electronically Signed   By: Julian Hy M.D.   On: 08/22/2016 22:35   Ct Cervical Spine Wo Contrast  Result Date:  08/22/2016 CLINICAL DATA:  Fall out of window 15 feet onto head, loss of consciousness EXAM: CT HEAD WITHOUT CONTRAST CT CERVICAL SPINE WITHOUT CONTRAST TECHNIQUE: Multidetector CT imaging of the head and cervical spine was performed following the standard protocol  without intravenous contrast. Multiplanar CT image reconstructions of the cervical spine were also generated. COMPARISON:  None. FINDINGS: CT HEAD FINDINGS Brain: 7 mm subcortical hemorrhage in the medial right frontal lobe (series 3/image 24), worrisome for parenchymal contusion. No evidence of acute infarction, hydrocephalus, extra-axial collection or mass lesion/mass effect. Vascular: No hyperdense vessel or unexpected calcification. Skull: Normal. Negative for fracture or focal lesion. Sinuses/Orbits: Partial opacification of the right maxillary sinus. Visualized paranasal sinuses and mastoid air cells are otherwise clear. Other: Soft tissue swelling/hematoma/laceration overlying the left parietal bone (series 3/ image 21). CT CERVICAL SPINE FINDINGS Alignment: Normal cervical lordosis. Skull base and vertebrae: No acute fracture. No primary bone lesion or focal pathologic process. Soft tissues and spinal canal: No prevertebral fluid or swelling. No visible canal hematoma. Disc levels:  Mild degenerative changes. Spinal canal is patent. Upper chest: Visualized lung apices are clear. Other: Visualized thyroid is unremarkable. IMPRESSION: 7 mm subcortical hemorrhage in the medial right frontal lobe, worrisome for parenchymal contusion. Soft tissue swelling/ hematoma/laceration overlying the left parietal bone. No evidence of calvarial fracture. No evidence of acute traumatic injury to the cervical spine. Critical Value/emergent results were called by telephone at the time of interpretation on 08/22/2016 at 10:32 pm to Dr. Duffy Bruce , who verbally acknowledged these results. Electronically Signed   By: Julian Hy M.D.   On: 08/22/2016 22:35   Dg Chest Portable 1 View  Result Date: 08/22/2016 CLINICAL DATA:  Status post fall, left shoulder pain EXAM: PORTABLE CHEST 1 VIEW COMPARISON:  None. FINDINGS: Lungs are clear.  No pleural effusion or pneumothorax. The heart is normal size. IMPRESSION: No evidence  of acute cardiopulmonary disease. Electronically Signed   By: Julian Hy M.D.   On: 08/22/2016 22:07   Dg Shoulder Left Port  Result Date: 08/22/2016 CLINICAL DATA:  Status post fall, left shoulder pain EXAM: LEFT SHOULDER - 1 VIEW COMPARISON:  None. FINDINGS: Left shoulder is within normal limits. Mild displaced inferior scapular fracture. Visualized left lung is clear. IMPRESSION: Mildly displaced inferior scapular fracture. Electronically Signed   By: Julian Hy M.D.   On: 08/22/2016 22:07    Anti-infectives: Anti-infectives    None      Assessment/Plan: Patient Active Problem List   Diagnosis Date Noted  . Cerebral contusion ([place]) 08/22/2016  Left scapular fracture  - Per ortho  ETOH abuse   CIWA protocol  Scalp laceration -  local wound care    Jump from window -  Not sure how reliable this story is but will place on suicide precautions for now  Will need psychiatry evaluation   Keep in ICU for now    LOS: 1 day    Johnny Kellen A. 08/23/2016

## 2016-08-23 NOTE — Progress Notes (Signed)
Orthopedic Tech Progress Note Patient Details:  Johnny Mata 06-16-1975 [id number]  Ortho Devices Type of Ortho Device: Sling immobilizer Ortho Device/Splint Location: lue Ortho Device/Splint Interventions: Ordered, Application   Karolee Stamps 08/23/2016, 1:19 AM

## 2016-08-23 NOTE — Consult Note (Signed)
CC:  Chief Complaint  Patient presents with  . Fall  . Head Injury    HPI: Johnny Mata is a 41 y.o. male who was brought to ER by EMS after falling out of second story window. There was LOC. Reportedly fell face first. He only complains of UE pain (L >R). He denies neurological symptoms. He has been stable since arrival. Does have left scapular fracture and in sling.  Most of history is gained from chart review and nursing. Patient gives little information.  PMH: History reviewed. No pertinent past medical history.  PSH: History reviewed. No pertinent surgical history.  SH: Social History  Substance Use Topics  . Smoking status: Current Every Day Smoker    Types: Cigarettes  . Smokeless tobacco: Never Used  . Alcohol use Yes     Comment: EVERY DAY    MEDS: Prior to Admission medications   Medication Sig Start Date End Date Taking? Authorizing Provider  Diphenhydramine-APAP, sleep, (GOODYS PM) 38-500 MG PACK Take 1 packet by mouth See admin instructions. Take 1 packet by mouth upon arrival home at night, then take another packet about an hour later   Yes Historical Provider, MD    ALLERGY: No Known Allergies  ROS: Review of Systems  Constitutional: Negative.   HENT: Negative.   Eyes: Negative.   Gastrointestinal: Negative.   Musculoskeletal: Positive for falls and myalgias. Negative for back pain and neck pain.  Skin: Negative.   Neurological: Negative for dizziness, tingling, tremors, sensory change, speech change, focal weakness, seizures, loss of consciousness and headaches.    Vitals:   08/23/16 0630 08/23/16 0700  BP: (!) 142/78 133/78  Pulse: (!) 112 (!) 103  Resp: (!) 27 17  Temp:     General appearance: WDWN.  Eyes: PERRL Cardiovascular: Regular rate and rhythm without murmurs, rubs, gallops. No edema or variciosities. Distal pulses normal. Pulmonary: Clear to auscultation Musculoskeletal:     Muscle tone upper extremities: Normal    Muscle tone  lower extremities: Normal    Motor exam: MAE well with exception of LUE (currently in sling d/t scapular fx). Strength is normal throughout otherwise   Neurological Awake, alert, oriented Speech fluent, appropriate CNII: Visual fields normal CNIII/IV/VI: EOMI CNV: Facial sensation normal CNVII: Symmetric, normal strength CNVIII: Grossly normal CNIX: Normal palate movement CNXI: Trap and SCM strength normal CN XII: Tongue protrusion normal Sensation grossly intact to LT  IMAGING: IMPRESSION: 7 mm subcortical hemorrhage in the medial right frontal lobe, worrisome for parenchymal contusion. Soft tissue swelling/ hematoma/laceration overlying the left parietal bone. No evidence of calvarial fracture.  No evidence of acute traumatic injury to the cervical spine  IMPRESSION/PLAN - 41 y.o. male with parenchymal contusion s/p fall. He is neurologically intact. No need for NS intervention. Continue to monitor. Start Keppra 500mg  BID x7days. Will sign off. Call for any concerns.

## 2016-08-23 NOTE — Progress Notes (Signed)
Received call from patient's friend, Garner Gavel, who was with the patient last night. He stated the patient called him last night needing to talk. When Mr. Mctigue arrived, he was intoxicated. Mr. Quentin Cornwall states he and Mr. Duzan were talking and getting ready to eat dinner. Mr. Quentin Cornwall went to the restroom and when he came out, the window was open and the patient was gone. Mr. Quentin Cornwall heard a scream from his neighbor and saw Mr. Nez unconscious on the ground. His neighbor informed him that Mr. Rising had intentionally jumped from the window. Mr. Quentin Cornwall expressed great concern for Mr. Hartshorne well-being and mental stability. Following this information, Dr. Brantley Stage of Trauma Services was notified. The patient was placed on suicide precautions. VS are stable. BP 144/91 HR 95 O2 96 % on 2L [place]. Will continue to monitor patient and update as needed.

## 2016-08-23 NOTE — Consult Note (Signed)
ORTHOPAEDIC CONSULTATION  REQUESTING PHYSICIAN: Trauma Md, MD  Chief Complaint: Left Scapular Pain after fall.  Assessment / Plan: Active Problems:   Cerebral contusion (HCC)  Mild displaced inferior scapular fracture. Non-operative management. Pain control. NWB LUE for 2 weeks. Sling for comfort. Follow up in the office with Dr. Alain Marion in 2 weeks.  Please call with questions.  HPI: Johnny Mata is a 41 y.o. male alcoholic who complains of Left upper back pain after a fall out of a window.   He states that he does not remember the event.  Presented to ED where xrays show  Mild displaced inferior scapular fracture.  Orthopedics was consulted for evaluation.  Currently in significant pain with any ROM of left arm.  No numbness.  No SOB.    Past Medical History:  Diagnosis Date  . Anxiety    Never officially diagnosed; does not take medication for anxiety  . Asthma    Hasn't had an episode since childhood   Past Surgical History:  Procedure Laterality Date  . TONSILLECTOMY    . WISDOM TOOTH EXTRACTION Bilateral 1995   Social History   Social History  . Marital status: Unknown    Spouse name: N/A  . Number of children: N/A  . Years of education: N/A   Social History Main Topics  . Smoking status: Current Every Day Smoker    Packs/day: 1.50    Years: 20.00    Types: Cigarettes  . Smokeless tobacco: Never Used  . Alcohol use 4.2 oz/week    7 Shots of liquor per week     Comment: EVERY DAY  . Drug use: Yes    Types: Marijuana     Comment: OCC  . Sexual activity: Not Asked   Other Topics Concern  . None   Social History Narrative  . None   History reviewed. No pertinent family history. No Known Allergies Prior to Admission medications   Medication Sig Start Date End Date Taking? Authorizing Provider  Diphenhydramine-APAP, sleep, (GOODYS PM) 38-500 MG PACK Take 1 packet by mouth See admin instructions. Take 1 packet by mouth upon arrival home at  night, then take another packet about an hour later   Yes Historical Provider, MD   Ct Head Wo Contrast  Result Date: 08/22/2016 CLINICAL DATA:  Fall out of window 15 feet onto head, loss of consciousness EXAM: CT HEAD WITHOUT CONTRAST CT CERVICAL SPINE WITHOUT CONTRAST TECHNIQUE: Multidetector CT imaging of the head and cervical spine was performed following the standard protocol without intravenous contrast. Multiplanar CT image reconstructions of the cervical spine were also generated. COMPARISON:  None. FINDINGS: CT HEAD FINDINGS Brain: 7 mm subcortical hemorrhage in the medial right frontal lobe (series 3/image 24), worrisome for parenchymal contusion. No evidence of acute infarction, hydrocephalus, extra-axial collection or mass lesion/mass effect. Vascular: No hyperdense vessel or unexpected calcification. Skull: Normal. Negative for fracture or focal lesion. Sinuses/Orbits: Partial opacification of the right maxillary sinus. Visualized paranasal sinuses and mastoid air cells are otherwise clear. Other: Soft tissue swelling/hematoma/laceration overlying the left parietal bone (series 3/ image 21). CT CERVICAL SPINE FINDINGS Alignment: Normal cervical lordosis. Skull base and vertebrae: No acute fracture. No primary bone lesion or focal pathologic process. Soft tissues and spinal canal: No prevertebral fluid or swelling. No visible canal hematoma. Disc levels:  Mild degenerative changes. Spinal canal is patent. Upper chest: Visualized lung apices are clear. Other: Visualized thyroid is unremarkable. IMPRESSION: 7 mm subcortical hemorrhage in the  medial right frontal lobe, worrisome for parenchymal contusion. Soft tissue swelling/ hematoma/laceration overlying the left parietal bone. No evidence of calvarial fracture. No evidence of acute traumatic injury to the cervical spine. Critical Value/emergent results were called by telephone at the time of interpretation on 08/22/2016 at 10:32 pm to Dr. Duffy Bruce  , who verbally acknowledged these results. Electronically Signed   By: Julian Hy M.D.   On: 08/22/2016 22:35   Ct Cervical Spine Wo Contrast  Result Date: 08/22/2016 CLINICAL DATA:  Fall out of window 15 feet onto head, loss of consciousness EXAM: CT HEAD WITHOUT CONTRAST CT CERVICAL SPINE WITHOUT CONTRAST TECHNIQUE: Multidetector CT imaging of the head and cervical spine was performed following the standard protocol without intravenous contrast. Multiplanar CT image reconstructions of the cervical spine were also generated. COMPARISON:  None. FINDINGS: CT HEAD FINDINGS Brain: 7 mm subcortical hemorrhage in the medial right frontal lobe (series 3/image 24), worrisome for parenchymal contusion. No evidence of acute infarction, hydrocephalus, extra-axial collection or mass lesion/mass effect. Vascular: No hyperdense vessel or unexpected calcification. Skull: Normal. Negative for fracture or focal lesion. Sinuses/Orbits: Partial opacification of the right maxillary sinus. Visualized paranasal sinuses and mastoid air cells are otherwise clear. Other: Soft tissue swelling/hematoma/laceration overlying the left parietal bone (series 3/ image 21). CT CERVICAL SPINE FINDINGS Alignment: Normal cervical lordosis. Skull base and vertebrae: No acute fracture. No primary bone lesion or focal pathologic process. Soft tissues and spinal canal: No prevertebral fluid or swelling. No visible canal hematoma. Disc levels:  Mild degenerative changes. Spinal canal is patent. Upper chest: Visualized lung apices are clear. Other: Visualized thyroid is unremarkable. IMPRESSION: 7 mm subcortical hemorrhage in the medial right frontal lobe, worrisome for parenchymal contusion. Soft tissue swelling/ hematoma/laceration overlying the left parietal bone. No evidence of calvarial fracture. No evidence of acute traumatic injury to the cervical spine. Critical Value/emergent results were called by telephone at the time of interpretation  on 08/22/2016 at 10:32 pm to Dr. Duffy Bruce , who verbally acknowledged these results. Electronically Signed   By: Julian Hy M.D.   On: 08/22/2016 22:35   Dg Chest Portable 1 View  Result Date: 08/22/2016 CLINICAL DATA:  Status post fall, left shoulder pain EXAM: PORTABLE CHEST 1 VIEW COMPARISON:  None. FINDINGS: Lungs are clear.  No pleural effusion or pneumothorax. The heart is normal size. IMPRESSION: No evidence of acute cardiopulmonary disease. Electronically Signed   By: Julian Hy M.D.   On: 08/22/2016 22:07   Dg Shoulder Left Port  Result Date: 08/22/2016 CLINICAL DATA:  Status post fall, left shoulder pain EXAM: LEFT SHOULDER - 1 VIEW COMPARISON:  None. FINDINGS: Left shoulder is within normal limits. Mild displaced inferior scapular fracture. Visualized left lung is clear. IMPRESSION: Mildly displaced inferior scapular fracture. Electronically Signed   By: Julian Hy M.D.   On: 08/22/2016 22:07    Positive ROS: All other systems have been reviewed and were otherwise negative with the exception of those mentioned in the HPI and as above.  Objective: Labs cbc  Recent Labs  08/22/16 2142 08/22/16 2200 08/23/16 0111  WBC 4.0  --  9.0  HGB 16.5 16.3 14.3  HCT 45.8 48.0 41.1  PLT 118*  --  112*    Labs inflam No results for input(s): CRP in the last 72 hours.  Invalid input(s): ESR  Labs coag  Recent Labs  08/22/16 2142 08/23/16 0111  INR 0.89 0.99     Recent Labs  08/22/16 2142 08/22/16  2200 08/23/16 0111  NA 140 139 142  K 3.6 3.4* 4.0  CL 100* 99* 105  CO2 24  --  21*  GLUCOSE 130* 135* 103*  BUN <5* <3* <5*  CREATININE 0.85 1.30* 0.66  CALCIUM 8.9  --  8.6*    Physical Exam: Vitals:   08/23/16 0800 08/23/16 0900  BP: 136/86 (!) 144/91  Pulse: 91 95  Resp: 17 (!) 9  Temp: 99.7 F (37.6 C)    General: Alert, uncomfortable.  Conversant. Mental status: Alert and Oriented x3 Neurologic: Speech Clear and organized, no gross  focal findings or movement disorder appreciated. Respiratory: No cyanosis, no use of accessory musculature Cardiovascular: No pedal edema GI: Abdomen is soft and non-tender, non-distended. Skin: Warm and dry.   Extremities: Warm and well perfused w/o edema Psychiatric: Patient is competent for consent with normal mood and affect  MUSCULOSKELETAL:  Left arm in sling.  Pain with any ROM of left arm.  Left hand warm.  Wiggles fingers.  Good Grip.  Sensation intact distally. Other extremities are atraumatic with painless ROM and NVI.   Prudencio Burly III PA-C 08/23/2016 10:26 AM

## 2016-08-24 DIAGNOSIS — Y908 Blood alcohol level of 240 mg/100 ml or more: Secondary | ICD-10-CM

## 2016-08-24 DIAGNOSIS — F1290 Cannabis use, unspecified, uncomplicated: Secondary | ICD-10-CM

## 2016-08-24 DIAGNOSIS — F17210 Nicotine dependence, cigarettes, uncomplicated: Secondary | ICD-10-CM

## 2016-08-24 DIAGNOSIS — S06339A Contusion and laceration of cerebrum, unspecified, with loss of consciousness of unspecified duration, initial encounter: Principal | ICD-10-CM

## 2016-08-24 DIAGNOSIS — S42112A Displaced fracture of body of scapula, left shoulder, initial encounter for closed fracture: Secondary | ICD-10-CM

## 2016-08-24 DIAGNOSIS — F10129 Alcohol abuse with intoxication, unspecified: Secondary | ICD-10-CM

## 2016-08-24 DIAGNOSIS — Z79891 Long term (current) use of opiate analgesic: Secondary | ICD-10-CM

## 2016-08-24 DIAGNOSIS — Z79899 Other long term (current) drug therapy: Secondary | ICD-10-CM

## 2016-08-24 MED ORDER — ENOXAPARIN SODIUM 40 MG/0.4ML ~~LOC~~ SOLN
40.0000 mg | SUBCUTANEOUS | Status: DC
  Administered 2016-08-24: 40 mg via SUBCUTANEOUS
  Filled 2016-08-24: qty 0.4

## 2016-08-24 MED ORDER — OXYCODONE HCL 5 MG PO TABS
5.0000 mg | ORAL_TABLET | ORAL | Status: DC | PRN
  Administered 2016-08-24 – 2016-08-25 (×4): 15 mg via ORAL
  Filled 2016-08-24 (×4): qty 3

## 2016-08-24 MED ORDER — DOCUSATE SODIUM 100 MG PO CAPS
100.0000 mg | ORAL_CAPSULE | Freq: Two times a day (BID) | ORAL | Status: DC
  Administered 2016-08-24 – 2016-08-25 (×3): 100 mg via ORAL
  Filled 2016-08-24 (×3): qty 1

## 2016-08-24 MED ORDER — HYDROMORPHONE HCL 1 MG/ML IJ SOLN
1.0000 mg | INTRAMUSCULAR | Status: DC | PRN
  Administered 2016-08-24 – 2016-08-25 (×4): 1 mg via INTRAVENOUS
  Filled 2016-08-24 (×4): qty 1

## 2016-08-24 NOTE — Progress Notes (Signed)
Patient arrived via bed to room 4N07 from 17M ICU. Patient alert and oriented with suicide sitter. Patient with Dilaudid PCA intact see Mar for setting. Left arm in sling and staples present in back of head with slight oozing on pillow case. Patient denies headache but complains of left arm pain with any movement.VSS on transfer, Left arm supported with sling and pillow for comfort. PIV's intact with IV fluids infusing.Will continue to monitor closely.

## 2016-08-24 NOTE — Progress Notes (Signed)
Patient ID: Johnny Mata, male   DOB: 06/09/1975, 41 y.o.   MRN: [id number]  [facility] Progress Note     Subjective: CC- fall from window Main complaint this morning is left shoulder pain. States that dilaudid does help but he does not like the way it makes him feel. Percocet also helped yesterday. He is tolerating small amounts of regular diet. Passing flatus, no BM. States that he has no idea how/why he fell out of the window on 4/7. Currently denies any suicidal ideation. Per chart, friend thinks that he may have jumped purposefully out of window.  Objective: Vital signs in last 24 hours: Temp:  [97.9 F (36.6 C)-98.7 F (37.1 C)] 98.1 F (36.7 C) (04/09 0834) Pulse Rate:  [65-97] 78 (04/09 0834) Resp:  [12-20] 16 (04/09 0834) BP: (124-165)/(80-113) 143/80 (04/09 0834) SpO2:  [96 %-100 %] 97 % (04/09 0834) Last BM Date:  (Pt doesn't remember)  Intake/Output from previous day: 04/08 0701 - 04/09 0700 In: 3778.4 [P.O.:1160; I.V.:2408.4; IV Piggyback:210] Out: 1525 [Urine:1525] Intake/Output this shift: Total I/O In: 240 [P.O.:240] Out: 1050 [Urine:1050]  PE: Gen:  Alert, NAD, cooperative Head: posterior scalp laceration with trace bloody drainage  Card:  RRR, no M/G/R heard Pulm:  CTAB, no W/R/R, effort normal Abd: Soft, NT/ND, +BS, no HSM, no hernia LUE: in sling, fingers WWP with SILT, able to wiggle fingers. Mild swelling in left shoulder with TTP.  Ext:  No erythema, edema, or tenderness BLE  Lab Results:   Recent Labs  08/22/16 2142 08/22/16 2200 08/23/16 0111  WBC 4.0  --  9.0  HGB 16.5 16.3 14.3  HCT 45.8 48.0 41.1  PLT 118*  --  112*   BMET  Recent Labs  08/22/16 2142 08/22/16 2200 08/23/16 0111  NA 140 139 142  K 3.6 3.4* 4.0  CL 100* 99* 105  CO2 24  --  21*  GLUCOSE 130* 135* 103*  BUN <5* <3* <5*  CREATININE 0.85 1.30* 0.66  CALCIUM 8.9  --  8.6*   PT/INR  Recent Labs  08/22/16 2142 08/23/16 0111  LABPROT 12.0  13.1  INR 0.89 0.99   CMP     Component Value Date/Time   NA 142 08/23/2016 0111   K 4.0 08/23/2016 0111   CL 105 08/23/2016 0111   CO2 21 (L) 08/23/2016 0111   GLUCOSE 103 (H) 08/23/2016 0111   BUN <5 (L) 08/23/2016 0111   CREATININE 0.66 08/23/2016 0111   CALCIUM 8.6 (L) 08/23/2016 0111   PROT 7.2 08/22/2016 2142   ALBUMIN 4.7 08/22/2016 2142   AST 157 (H) 08/22/2016 2142   ALT 136 (H) 08/22/2016 2142   ALKPHOS 56 08/22/2016 2142   BILITOT 0.3 08/22/2016 2142   GFRNONAA >60 08/23/2016 0111   GFRAA >60 08/23/2016 0111   Lipase  No results found for: LIPASE     Studies/Results: Ct Head Wo Contrast  Result Date: 08/23/2016 CLINICAL DATA:  Fall from 15 foot window EXAM: CT HEAD WITHOUT CONTRAST TECHNIQUE: Contiguous axial images were obtained from the base of the skull through the vertex without intravenous contrast. COMPARISON:  08/22/2016 FINDINGS: Brain: 9 x 6 x 6 mm (volume = 0.2 mL) parenchymal contusion in the medial right frontal lobe (series 3/image 20), mildly increased. Surrounding mild vasogenic edema. No mass effect or midline shift. No evidence of acute infarction, hydrocephalus, or extra-axial collection. Vascular: No hyperdense vessel or unexpected calcification. Skull: Normal. Negative for fracture or focal lesion. Sinuses/Orbits: The visualized  paranasal sinuses are essentially clear. The mastoid air cells are unopacified. Other: Soft tissue swelling/hematoma overlying the left parietal bone. Overlying skin staples. IMPRESSION: 9 x 6 x 6 mm (volume = 0.2 mL) parenchymal contusion in the medial right frontal lobe, mildly increased. Surrounding mild vasogenic edema. No mass effect or midline shift. Soft tissue swelling/ hematoma overlying the left frontal bone. No evidence of calvarial fracture. Electronically Signed   By: Julian Hy M.D.   On: 08/23/2016 15:14   Ct Head Wo Contrast  Result Date: 08/22/2016 CLINICAL DATA:  Fall out of window 15 feet onto head,  loss of consciousness EXAM: CT HEAD WITHOUT CONTRAST CT CERVICAL SPINE WITHOUT CONTRAST TECHNIQUE: Multidetector CT imaging of the head and cervical spine was performed following the standard protocol without intravenous contrast. Multiplanar CT image reconstructions of the cervical spine were also generated. COMPARISON:  None. FINDINGS: CT HEAD FINDINGS Brain: 7 mm subcortical hemorrhage in the medial right frontal lobe (series 3/image 24), worrisome for parenchymal contusion. No evidence of acute infarction, hydrocephalus, extra-axial collection or mass lesion/mass effect. Vascular: No hyperdense vessel or unexpected calcification. Skull: Normal. Negative for fracture or focal lesion. Sinuses/Orbits: Partial opacification of the right maxillary sinus. Visualized paranasal sinuses and mastoid air cells are otherwise clear. Other: Soft tissue swelling/hematoma/laceration overlying the left parietal bone (series 3/ image 21). CT CERVICAL SPINE FINDINGS Alignment: Normal cervical lordosis. Skull base and vertebrae: No acute fracture. No primary bone lesion or focal pathologic process. Soft tissues and spinal canal: No prevertebral fluid or swelling. No visible canal hematoma. Disc levels:  Mild degenerative changes. Spinal canal is patent. Upper chest: Visualized lung apices are clear. Other: Visualized thyroid is unremarkable. IMPRESSION: 7 mm subcortical hemorrhage in the medial right frontal lobe, worrisome for parenchymal contusion. Soft tissue swelling/ hematoma/laceration overlying the left parietal bone. No evidence of calvarial fracture. No evidence of acute traumatic injury to the cervical spine. Critical Value/emergent results were called by telephone at the time of interpretation on 08/22/2016 at 10:32 pm to Dr. Duffy Bruce , who verbally acknowledged these results. Electronically Signed   By: Julian Hy M.D.   On: 08/22/2016 22:35   Ct Cervical Spine Wo Contrast  Result Date: 08/22/2016 CLINICAL  DATA:  Fall out of window 15 feet onto head, loss of consciousness EXAM: CT HEAD WITHOUT CONTRAST CT CERVICAL SPINE WITHOUT CONTRAST TECHNIQUE: Multidetector CT imaging of the head and cervical spine was performed following the standard protocol without intravenous contrast. Multiplanar CT image reconstructions of the cervical spine were also generated. COMPARISON:  None. FINDINGS: CT HEAD FINDINGS Brain: 7 mm subcortical hemorrhage in the medial right frontal lobe (series 3/image 24), worrisome for parenchymal contusion. No evidence of acute infarction, hydrocephalus, extra-axial collection or mass lesion/mass effect. Vascular: No hyperdense vessel or unexpected calcification. Skull: Normal. Negative for fracture or focal lesion. Sinuses/Orbits: Partial opacification of the right maxillary sinus. Visualized paranasal sinuses and mastoid air cells are otherwise clear. Other: Soft tissue swelling/hematoma/laceration overlying the left parietal bone (series 3/ image 21). CT CERVICAL SPINE FINDINGS Alignment: Normal cervical lordosis. Skull base and vertebrae: No acute fracture. No primary bone lesion or focal pathologic process. Soft tissues and spinal canal: No prevertebral fluid or swelling. No visible canal hematoma. Disc levels:  Mild degenerative changes. Spinal canal is patent. Upper chest: Visualized lung apices are clear. Other: Visualized thyroid is unremarkable. IMPRESSION: 7 mm subcortical hemorrhage in the medial right frontal lobe, worrisome for parenchymal contusion. Soft tissue swelling/ hematoma/laceration overlying the left parietal  bone. No evidence of calvarial fracture. No evidence of acute traumatic injury to the cervical spine. Critical Value/emergent results were called by telephone at the time of interpretation on 08/22/2016 at 10:32 pm to Dr. Duffy Bruce , who verbally acknowledged these results. Electronically Signed   By: Julian Hy M.D.   On: 08/22/2016 22:35   Dg Chest Portable 1  View  Result Date: 08/22/2016 CLINICAL DATA:  Status post fall, left shoulder pain EXAM: PORTABLE CHEST 1 VIEW COMPARISON:  None. FINDINGS: Lungs are clear.  No pleural effusion or pneumothorax. The heart is normal size. IMPRESSION: No evidence of acute cardiopulmonary disease. Electronically Signed   By: Julian Hy M.D.   On: 08/22/2016 22:07   Dg Shoulder Left Port  Result Date: 08/22/2016 CLINICAL DATA:  Status post fall, left shoulder pain EXAM: LEFT SHOULDER - 1 VIEW COMPARISON:  None. FINDINGS: Left shoulder is within normal limits. Mild displaced inferior scapular fracture. Visualized left lung is clear. IMPRESSION: Mildly displaced inferior scapular fracture. Electronically Signed   By: Julian Hy M.D.   On: 08/22/2016 22:07    Anti-infectives: Anti-infectives    None       Assessment/Plan Fall vs jump from 2nd story window 7 mm subcortical hemorrhage left frontal area - Keppra 500mg  BID x7days per NS Scalp laceration - s/p closure in ED 4/8 Left scapular fracture - nonop per ortho, NWB LUE for 2 weeks with sling for comfort; f/u Dr. Percell Miller in 2 weeks Alcoholism--on CIWA protocol  Pain - d/c PCA, use IV dilaudid for breakthrough pain, oxy 5-15mg  q4hr PRN ID - none FEN - regular diet VTE - SCDs, lovenox  Dispo - psych consult pending. PT.   LOS: 2 days    Jerrye Beavers , [facility] 08/24/2016, 9:48 AM Pager: [phone] Consults: [phone] Mon-Fri 7:00 am-4:30 pm Sat-Sun 7:00 am-11:30 am

## 2016-08-24 NOTE — Progress Notes (Signed)
Patient is currently refusing bed alarm and NT or RN to accompany him to bathroom, states "I will not fall." Fall safety and prevention education provided, pt states understanding but continues to insist on getting OOB unassisted. Will continue to monitor closely.

## 2016-08-24 NOTE — Consult Note (Signed)
Johnny Mata   Reason for Mata:  Alcohol intoxication, fall from second floor and cerebral contusion Referring Physician:  Trauma, MD Patient Identification: Johnny Mata MRN:  [id number] Principal Diagnosis: Alcohol abuse with intoxication [facility]) Diagnosis:   Patient Active Problem List   Diagnosis Date Noted  . Alcohol abuse with intoxication ([place]) [F10.129] 08/24/2016  . Closed displaced fracture of body of left scapula [S42.112A] 08/23/2016  . Cerebral contusion [facility]) [S06.339A] 08/22/2016    Total Time spent with patient: 1 hour  Subjective:   Johnny Mata is a 41 y.o. male patient admitted with Alcohol intoxication, fall from second floor and cerebral contusion.  HPI:  Johnny Mata is a 41 years old male admitted with alcohol intoxication, fall from second floor and resulted cerebral contusion. Patient reported he has been with his a friend's apartment and has been drinking vodka, reportedly had 6-7 shots and then he decided to go to the bathroom but does not know how he ended up falling from the second floor window. Patient stated definitely has no suicidal ideation, intention or plans. Patient has no reported homicidal ideation, intention or plans. Patient has no evidence of psychotic symptoms. Patient reported he lives by himself in the extended motel and also working with a [employer]. Patient reported he drinks alcohol every day and he likes drinking reportedly drinking minimal 10 years and reportedly never happened accident slight this one before reportedly patient worked for [employer] between 2002 2010 after that he has been worked several jobs and currently working with the [employer]. Patient reportedly had one previous DUI while living in [place] and required mandatory substance abuse rehabilitation treatment. Patient reported he cannot afford to lose his job or income so he would like to try to get outpatient substance abuse counseling or alcoholic  anonymous groups only. Patient was given options about participating residential substance abuse treatment program and also intensive outpatient substance abuse program.  Medical history: 41 year old male presents in the setting of fall. The patient was witnessed to fall out of second story window falling on back of head with positive loss of consciousness. Patient did not have any seizure-like activity and returned within normal limits quickly with EMS. Glucose within normal limits but patient smelled of alcohol. Hemodynamic stable in route.  On arrival patient with obvious laceration to posterior scalp. Wound repaired as noted above. Patient GCS of 15 but smells of alcohol. Patient complaining of left shoulder pain. Abdomen soft nontender nondistended. Abrasion to left shoulder and no other signs of obvious trauma. Trauma team was at bedside on initial evaluation and assisted with the care of this patient.  Laboratory analysis revealed alcohol intoxication as well as lactic acid acidosis. Patient given IV fluids. Additionally patient given tetanus. CT head revealed intraparenchymal contusion. Neurosurgery consultation and will evaluate patient in hospital. Additionally patient did have scapular fracture and discussed case with orthopedics who will see patient in the hospital.  The patient remained stable in the emergency department will be admitted to trauma service for further management of his condition. Stable at time of transfer of care.  Past Psychiatric History: Patient has been suffering with alcohol abuse versus dependence but denied illicit drug abuse.   Risk to Self: Is patient at risk for suicide?: No Risk to Others:   Prior Inpatient Therapy:   Prior Outpatient Therapy:    Past Medical History:  Past Medical History:  Diagnosis Date  . Anxiety    Never officially diagnosed; does not take  medication for anxiety  . Asthma    Hasn't had an episode since childhood    Past  Surgical History:  Procedure Laterality Date  . TONSILLECTOMY    . WISDOM TOOTH EXTRACTION Bilateral 1995   Family History: History reviewed. No pertinent family history. Family Psychiatric  History: Denied family history of mental illness.  Social History:  History  Alcohol Use  . 4.2 oz/week  . 7 Shots of liquor per week    Comment: EVERY DAY     History  Drug Use  . Types: Marijuana    Comment: Johnny Mata    Social History   Social History  . Marital status: Unknown    Spouse name: N/A  . Number of children: N/A  . Years of education: N/A   Social History Main Topics  . Smoking status: Current Every Day Smoker    Packs/day: 1.50    Years: 20.00    Types: Cigarettes  . Smokeless tobacco: Never Used  . Alcohol use 4.2 oz/week    7 Shots of liquor per week     Comment: EVERY DAY  . Drug use: Yes    Types: Marijuana     Comment: OCC  . Sexual activity: Not Asked   Other Topics Concern  . None   Social History Narrative  . None   Additional Social History:    Allergies:  No Known Allergies  Labs:  Results for orders placed or performed during the hospital encounter of 08/22/16 (from the past 48 hour(s))  CDS serology     Status: None   Collection Time: 08/22/16  9:42 PM  Result Value Ref Range   CDS serology specimen      SPECIMEN WILL BE HELD FOR 14 DAYS IF TESTING IS REQUIRED  Comprehensive metabolic panel     Status: Abnormal   Collection Time: 08/22/16  9:42 PM  Result Value Ref Range   Sodium 140 135 - 145 mmol/L   Potassium 3.6 3.5 - 5.1 mmol/L   Chloride 100 (L) 101 - 111 mmol/L   CO2 24 22 - 32 mmol/L   Glucose, Bld 130 (H) 65 - 99 mg/dL   BUN <5 (L) 6 - 20 mg/dL   Creatinine, Ser 0.85 0.61 - 1.24 mg/dL   Calcium 8.9 8.9 - 10.3 mg/dL   Total Protein 7.2 6.5 - 8.1 g/dL   Albumin 4.7 3.5 - 5.0 g/dL   AST 157 (H) 15 - 41 U/L   ALT 136 (H) 17 - 63 U/L   Alkaline Phosphatase 56 38 - 126 U/L   Total Bilirubin 0.3 0.3 - 1.2 mg/dL   GFR calc non Af  Amer >60 >60 mL/min   GFR calc Af Amer >60 >60 mL/min    Comment: (NOTE) The eGFR has been calculated using the CKD EPI equation. This calculation has not been validated in all clinical situations. eGFR's persistently <60 mL/min signify possible Chronic Kidney Disease.    Anion gap 16 (H) 5 - 15  CBC     Status: Abnormal   Collection Time: 08/22/16  9:42 PM  Result Value Ref Range   WBC 4.0 4.0 - 10.5 K/uL   RBC 4.77 4.22 - 5.81 MIL/uL   Hemoglobin 16.5 13.0 - 17.0 g/dL   HCT 45.8 39.0 - 52.0 %   MCV 96.0 78.0 - 100.0 fL   MCH 34.6 (H) 26.0 - 34.0 pg   MCHC 36.0 30.0 - 36.0 g/dL   RDW 12.9 11.5 - 15.5 %  Platelets 118 (L) 150 - 400 K/uL    Comment: REPEATED TO VERIFY SPECIMEN CHECKED FOR CLOTS PLATELET COUNT CONFIRMED BY SMEAR   Ethanol     Status: Abnormal   Collection Time: 08/22/16  9:42 PM  Result Value Ref Range   Alcohol, Ethyl (B) 392 (HH) <5 mg/dL    Comment:        LOWEST DETECTABLE LIMIT FOR SERUM ALCOHOL IS 5 mg/dL FOR MEDICAL PURPOSES ONLY CRITICAL RESULT CALLED TO, READ BACK BY AND VERIFIED WITH: CHERVENKA,K RN 08/22/2016 2250 JORDANS   Protime-INR     Status: None   Collection Time: 08/22/16  9:42 PM  Result Value Ref Range   Prothrombin Time 12.0 11.4 - 15.2 seconds   INR 0.89   Sample to Blood Bank     Status: None   Collection Time: 08/22/16  9:42 PM  Result Value Ref Range   Blood Bank Specimen SAMPLE AVAILABLE FOR TESTING    Sample Expiration 08/23/2016   I-Stat Chem 8, ED     Status: Abnormal   Collection Time: 08/22/16 10:00 PM  Result Value Ref Range   Sodium 139 135 - 145 mmol/L   Potassium 3.4 (L) 3.5 - 5.1 mmol/L   Chloride 99 (L) 101 - 111 mmol/L   BUN <3 (L) 6 - 20 mg/dL   Creatinine, Ser 1.30 (H) 0.61 - 1.24 mg/dL   Glucose, Bld 135 (H) 65 - 99 mg/dL   Calcium, Ion 0.93 (L) 1.15 - 1.40 mmol/L   TCO2 25 0 - 100 mmol/L   Hemoglobin 16.3 13.0 - 17.0 g/dL   HCT 48.0 39.0 - 52.0 %  I-Stat CG4 Lactic Acid, ED     Status: Abnormal    Collection Time: 08/22/16 10:01 PM  Result Value Ref Range   Lactic Acid, Venous 4.25 (HH) 0.5 - 1.9 mmol/L   Comment NOTIFIED PHYSICIAN   MRSA PCR Screening     Status: None   Collection Time: 08/23/16 12:20 AM  Result Value Ref Range   MRSA by PCR NEGATIVE NEGATIVE    Comment:        The GeneXpert MRSA Assay (FDA approved for NASAL specimens only), is one component of a comprehensive MRSA colonization surveillance program. It is not intended to diagnose MRSA infection nor to guide or monitor treatment for MRSA infections.   HIV antibody (Routine Testing)     Status: None   Collection Time: 08/23/16  1:11 AM  Result Value Ref Range   HIV Screen 4th Generation wRfx Non Reactive Non Reactive    Comment: (NOTE) Performed At: [facility] [place], [place] [id number] Lindon Romp MD [id number]   Basic metabolic panel     Status: Abnormal   Collection Time: 08/23/16  1:11 AM  Result Value Ref Range   Sodium 142 135 - 145 mmol/L   Potassium 4.0 3.5 - 5.1 mmol/L   Chloride 105 101 - 111 mmol/L   CO2 21 (L) 22 - 32 mmol/L   Glucose, Bld 103 (H) 65 - 99 mg/dL   BUN <5 (L) 6 - 20 mg/dL   Creatinine, Ser 0.66 0.61 - 1.24 mg/dL   Calcium 8.6 (L) 8.9 - 10.3 mg/dL   GFR calc non Af Amer >60 >60 mL/min   GFR calc Af Amer >60 >60 mL/min    Comment: (NOTE) The eGFR has been calculated using the CKD EPI equation. This calculation has not been validated in all clinical situations. eGFR's persistently <60 mL/min signify  possible Chronic Kidney Disease.    Anion gap 16 (H) 5 - 15  CBC     Status: Abnormal   Collection Time: 08/23/16  1:11 AM  Result Value Ref Range   WBC 9.0 4.0 - 10.5 K/uL   RBC 4.31 4.22 - 5.81 MIL/uL   Hemoglobin 14.3 13.0 - 17.0 g/dL   HCT 41.1 39.0 - 52.0 %   MCV 95.4 78.0 - 100.0 fL   MCH 33.2 26.0 - 34.0 pg   MCHC 34.8 30.0 - 36.0 g/dL   RDW 12.7 11.5 - 15.5 %   Platelets 112 (L) 150 - 400 K/uL    Comment: CONSISTENT WITH  PREVIOUS RESULT  Protime-INR     Status: None   Collection Time: 08/23/16  1:11 AM  Result Value Ref Range   Prothrombin Time 13.1 11.4 - 15.2 seconds   INR 0.99   Lactic acid, plasma     Status: None   Collection Time: 08/23/16 10:30 AM  Result Value Ref Range   Lactic Acid, Venous 1.1 0.5 - 1.9 mmol/L  Urinalysis, Routine w reflex microscopic     Status: Abnormal   Collection Time: 08/23/16  3:11 PM  Result Value Ref Range   Color, Urine YELLOW YELLOW   APPearance CLEAR CLEAR   Specific Gravity, Urine 1.017 1.005 - 1.030   pH 5.0 5.0 - 8.0   Glucose, UA NEGATIVE NEGATIVE mg/dL   Hgb urine dipstick MODERATE (A) NEGATIVE   Bilirubin Urine NEGATIVE NEGATIVE   Ketones, ur 5 (A) NEGATIVE mg/dL   Protein, ur NEGATIVE NEGATIVE mg/dL   Nitrite NEGATIVE NEGATIVE   Leukocytes, UA NEGATIVE NEGATIVE   RBC / HPF 0-5 0 - 5 RBC/hpf   WBC, UA 0-5 0 - 5 WBC/hpf   Bacteria, UA NONE SEEN NONE SEEN   Squamous Epithelial / LPF 0-5 (A) NONE SEEN   Mucous PRESENT    Hyaline Casts, UA PRESENT     Current Facility-Administered Medications  Medication Dose Route Frequency Provider Last Rate Last Dose  . 0.9 % NaCl with KCl 20 mEq/ L  infusion   Intravenous Continuous Judeth Horn, MD 100 mL/hr at 08/24/16 0757 100 mL/hr at 08/24/16 0757  . docusate sodium (COLACE) capsule 100 mg  100 mg Oral BID Jerrye Beavers, PA-C   100 mg at 08/24/16 1038  . enoxaparin (LOVENOX) injection 40 mg  40 mg Subcutaneous Q24H Jerrye Beavers, PA-C      . folic acid (FOLVITE) tablet 1 mg  1 mg Oral Daily Judeth Horn, MD   1 mg at 08/24/16 1035  . guaiFENesin (ROBITUSSIN) 100 MG/5ML solution 100 mg  5 mL Oral Q4H PRN Erroll Luna, MD   100 mg at 08/23/16 2154  . HYDROmorphone (DILAUDID) injection 1 mg  1 mg Intravenous Q4H PRN Jerrye Beavers, PA-C      . levETIRAcetam (KEPPRA) 500 mg in sodium chloride 0.9 % 100 mL IVPB  500 mg Intravenous Q12H Vincent J Costella, PA-C   500 mg at 08/24/16 1000  . LORazepam (ATIVAN)  injection 0-4 mg  0-4 mg Intravenous Q6H Judeth Horn, MD       Followed by  . [START ON 08/25/2016] LORazepam (ATIVAN) injection 0-4 mg  0-4 mg Intravenous Q12H Judeth Horn, MD      . LORazepam (ATIVAN) tablet 1 mg  1 mg Oral Q6H PRN Judeth Horn, MD       Or  . LORazepam (ATIVAN) injection 1 mg  1 mg Intravenous  Q6H PRN Judeth Horn, MD      . MEDLINE mouth rinse  15 mL Mouth Rinse BID Judeth Horn, MD   15 mL at 08/24/16 1000  . multivitamin with minerals tablet 1 tablet  1 tablet Oral Daily Judeth Horn, MD   1 tablet at 08/24/16 1035  . ondansetron (ZOFRAN) tablet 4 mg  4 mg Oral Q6H PRN Judeth Horn, MD       Or  . ondansetron [facility]) injection 4 mg  4 mg Intravenous Q6H PRN Judeth Horn, MD   4 mg at 08/23/16 1951  . oxyCODONE (Oxy IR/ROXICODONE) immediate release tablet 5-15 mg  5-15 mg Oral Q4H PRN Jerrye Beavers, PA-C   15 mg at 08/24/16 1035  . pantoprazole (PROTONIX) EC tablet 40 mg  40 mg Oral Daily Judeth Horn, MD   40 mg at 08/24/16 1035   Or  . pantoprazole (PROTONIX) injection 40 mg  40 mg Intravenous Daily Judeth Horn, MD      . thiamine (VITAMIN B-1) tablet 100 mg  100 mg Oral Daily Judeth Horn, MD   100 mg at 08/24/16 1035   Or  . thiamine (B-1) injection 100 mg  100 mg Intravenous Daily Judeth Horn, MD        Musculoskeletal: Strength & Muscle Tone: within normal limits Gait & Station: normal Patient leans: N/A  Psychiatric Specialty Exam: Physical Exam as per history and physical   ROS  No Fever-chills, No Headache, No changes with Vision or hearing, reports vertigo No problems swallowing food or Liquids, No Chest pain, Cough or Shortness of Breath, No Abdominal pain, No Nausea or Vommitting, Bowel movements are regular, No Blood in stool or Urine, No dysuria, No new skin rashes or bruises, No new joints pains-aches,  No new weakness, tingling, numbness in any extremity, No recent weight gain or loss, No polyuria, polydypsia or polyphagia,   A full 10 point  Review of Systems was done, except as stated above, all other Review of Systems were negative.  Blood pressure (!) 143/80, pulse 78, temperature 98.1 F (36.7 C), temperature source Oral, resp. rate 16, height 5' 11.75" (1.822 m), weight 74.8 kg (165 lb), SpO2 97 %.Body mass index is 22.53 kg/m.  General Appearance: Disheveled and Guarded, unshaven long beard and hair  Eye Contact:  Good  Speech:  Clear and Coherent  Volume:  Decreased  Mood:  Anxious and Depressed  Affect:  Constricted and Depressed  Thought Process:  Coherent and Goal Directed  Orientation:  Full (Time, Place, and Person)  Thought Content:  Logical  Suicidal Thoughts:  No  Homicidal Thoughts:  No  Memory:  Immediate;   Fair Recent;   Fair Remote;   Fair  Judgement:  Intact  Insight:  Fair  Psychomotor Activity:  Decreased  Concentration:  Concentration: Fair and Attention Span: Fair  Recall:  [employer] of Knowledge:  Good  Language:  Good  Akathisia:  Negative  Handed:  Right  AIMS (if indicated):     Assets:  Communication Skills Desire for Improvement Financial Resources/Insurance Housing Leisure Time Physical Health Resilience Social Support Talents/Skills Transportation Vocational/Educational  ADL's:  Intact  Cognition:  WNL  Sleep:        Treatment Plan Summary: 41 years old male presented with alcohol intoxication, blood alcohol level is 392 and accidental fall from second floor window which resulted cerebral contusion and closed displaced fracture of body of left scapula. Patient denies current symptoms of suicidal/homicidal ideation and  has no evidence of psychosis. Patient has been receiving lorazepam detox treatment for alcohol withdrawal symptoms   Alcohol intoxication  Accidental fall as per patient report  Denied active suicidal/homicidal ideation  Discontinue suicidal sitter  Referred to the unit social service for providing appropriate outpatient resources including intensive  outpatient treatment for substance abuse   Appreciate psychiatric consultation and we sign off as of today Please contact [phone] or [phone] if needs further assistance   Disposition: No evidence of imminent risk to self or others at present.   Patient does not meet criteria for psychiatric inpatient admission. Supportive therapy provided about ongoing stressors.  Ambrose Finland, MD 08/24/2016 12:21 PM

## 2016-08-24 NOTE — Evaluation (Signed)
Physical Therapy Evaluation Patient Details Name: Johnny Mata MRN: [id number] DOB: [birth year] Today's Date: 08/24/2016   History of Present Illness  Pt with Fall/jump out window; subcortical hemorrhage left frontal area, left scapula fx, NWB left UE  With sling; ETOH abuse.   Clinical Impression  Pt admitted with above diagnosis. Pt currently with functional limitations due to the deficits listed below (see PT Problem List). Pt was able to ambulate in hallways with min guard assist.  Occasional LOB which pt self corrected.  Feel that pt will improve with  Increased walking on unit.  Pt confident he can care for himself at his home on his own.  Discussed that things will be more difficult with use of only one UE.  Pt states he didn't think he can go back to work right now but he could manage at home.  Told pt to discuss with MD. VS 96% on RA with HR 90 bpm, 138/86..   Will follow acutely. Pt will benefit from skilled PT to increase their independence and safety with mobility to allow discharge to the venue listed below.      Follow Up Recommendations No PT follow up;Supervision - Intermittent    Equipment Recommendations  Cane (possibly)    Recommendations for Other Services       Precautions / Restrictions Precautions Precautions: Fall;Shoulder Shoulder Interventions: Shoulder sling/immobilizer;At all times Required Braces or Orthoses: Sling Restrictions Weight Bearing Restrictions: Yes LUE Weight Bearing: Non weight bearing      Mobility  Bed Mobility Overal bed mobility: Independent             General bed mobility comments: Pt wanted to exit bed on the right side.    Transfers Overall transfer level: Needs assistance Equipment used: None Transfers: Sit to/from Stand Sit to Stand: Min guard         General transfer comment: No assist needed to stand.  Steady with static stance without support.   Ambulation/Gait Ambulation/Gait assistance: Min guard;Min  assist Ambulation Distance (Feet): 250 Feet Assistive device: None Gait Pattern/deviations: Step-through pattern;Decreased stride length;Drifts right/left   Gait velocity interpretation: Below normal speed for age/gender General Gait Details: Pt ambulates with slight unsteadiness at times as he seems to be unsure on his feet.  No LOB but unsteady at times and was able to self correct.  He tended to drift to left.  Talked about using cane for stability but pt states he feels he will be ok the more he walks.    Stairs            Wheelchair Mobility    Modified Rankin (Stroke Patients Only) Modified Rankin (Stroke Patients Only) Pre-Morbid Rankin Score: No significant disability Modified Rankin: Slight disability     Balance Overall balance assessment: Needs assistance Sitting-balance support: No upper extremity supported;Feet supported Sitting balance-Leahy Scale: Fair     Standing balance support: No upper extremity supported;During functional activity Standing balance-Leahy Scale: Fair Standing balance comment: can stand statically without UE support.                             Pertinent Vitals/Pain Pain Assessment: Faces Faces Pain Scale: Hurts little more Pain Location: left UE and back Pain Descriptors / Indicators: Aching;Grimacing;Guarding;Sore Pain Intervention(s): Limited activity within patient's tolerance;Monitored during session;Premedicated before session;Repositioned    Home Living Family/patient expects to be discharged to:: Private residence [facility] on second floor per pt, has elevator) Living Arrangements: Alone  Type of Home: Other(Comment) (hotel apartment per pt) Home Access: [place]: One level Home Equipment: None Additional Comments: Pt works at [employer]    Prior Function Level of Independence: Independent               Journalist, newspaper        Extremity/Trunk Assessment   Upper Extremity  Assessment Upper Extremity Assessment: Defer to OT evaluation    Lower Extremity Assessment Lower Extremity Assessment: Overall WFL for tasks assessed    Cervical / Trunk Assessment Cervical / Trunk Assessment: Normal  Communication   Communication: No difficulties  Cognition Arousal/Alertness: Awake/alert Behavior During Therapy: WFL for tasks assessed/performed Overall Cognitive Status: Within Functional Limits for tasks assessed                                        General Comments      Exercises General Exercises - Lower Extremity Ankle Circles/Pumps: AROM;Both;10 reps;Supine Heel Slides: AROM;Both;10 reps;Supine Hip ABduction/ADduction: AROM;Both;10 reps;Supine   Assessment/Plan    PT Assessment Patient needs continued PT services  PT Problem List Decreased activity tolerance;Decreased balance;Decreased mobility;Decreased knowledge of use of DME;Decreased safety awareness;Decreased knowledge of precautions;Pain       PT Treatment Interventions DME instruction;Gait training;Functional mobility training;Therapeutic activities;Therapeutic exercise;Balance training;Patient/family education    PT Goals (Current goals can be found in the Care Plan section)  Acute Rehab PT Goals Patient Stated Goal: to get home PT Goal Formulation: With patient Time For Goal Achievement: 08/31/16 Potential to Achieve Goals: Good    Frequency Min 4X/week   Barriers to discharge Decreased caregiver support      Co-evaluation               End of Session Equipment Utilized During Treatment: Gait belt Activity Tolerance: Patient tolerated treatment well Patient left: in bed;with call bell/phone within reach;with nursing/sitter in room;with SCD's reapplied Nurse Communication: Mobility status PT Visit Diagnosis: Muscle weakness (generalized) (M62.81);Unsteadiness on feet (R26.81)    Time: 7209-4709 PT Time Calculation (min) (ACUTE ONLY): 15  min   Charges:   PT Evaluation $PT Eval Moderate Complexity: 1 Procedure     PT G Codes:        Zoeann Mol,PT Acute Rehabilitation [phone] (pager)   Denice Paradise 08/24/2016, 2:22 PM

## 2016-08-24 NOTE — Care Management Note (Signed)
Case Management Note  Patient Details  Name: Johnny Mata MRN: [id number] Date of Birth: 1975-06-27  Subjective/Objective:   Pt admitted on 08/22/16 with fall/jump out window, sustaining subcortical hemorrhage LT frontal area and left scapula fx.  PTA, pt independent, lives alone in a hotel.                  Action/Plan: PT recommending no follow with intermittent supervision.  Will follow for home needs as pt progresses.    Expected Discharge Date:                  Expected Discharge Plan:  Home/Self Care  In-House Referral:     Discharge planning Services  CM Consult  Post Acute Care Choice:    Choice offered to:     DME Arranged:    DME Agency:     HH Arranged:    HH Agency:     Status of Service:  In process, will continue to follow  If discussed at Long Length of Stay Meetings, dates discussed:    Additional Comments:  Reinaldo Raddle, RN, BSN  Trauma/Neuro ICU Case Manager [phone]

## 2016-08-25 MED ORDER — OXYCODONE HCL 5 MG PO TABS: 5.0000 mg | ORAL_TABLET | Freq: Four times a day (QID) | ORAL | 0 refills | Status: DC | PRN

## 2016-08-25 MED ORDER — LEVETIRACETAM 500 MG PO TABS: 500.0000 mg | ORAL_TABLET | Freq: Two times a day (BID) | ORAL | 0 refills | Status: DC

## 2016-08-25 MED ORDER — LEVETIRACETAM 500 MG PO TABS
500.0000 mg | ORAL_TABLET | Freq: Two times a day (BID) | ORAL | Status: DC
  Administered 2016-08-25: 500 mg via ORAL
  Filled 2016-08-25: qty 1

## 2016-08-25 NOTE — Progress Notes (Signed)
Pt left unit before given discharge paperwork. Was looking for pt when caregivers received a call from lobby staff that there was a pt down in lobby area standing around in a hospital gown. Went downstairs and brought pt back up to unit. Went over discharge instructions with pt who was clearly irritable and impatient to leave the hospital. He kept saying that his friend was coming to get him and that we needed to hurry up and let him go. Did not pay too much attention to discharge instructions stating that the doctor had already told him everything. AVS and discharge scripts given before pt left the unit. General condition stable on discharge

## 2016-08-25 NOTE — Progress Notes (Signed)
qPhysical Therapy Treatment Patient Details Name: Johnny Mata MRN: [id number] DOB: January 26, 1976 Today's Date: 08/25/2016    History of Present Illness Pt with Fall/jump out window; subcortical hemorrhage left frontal area, left scapula fx, NWB left UE  With sling; ETOH abuse.     PT Comments    Pt agreeable to PT. Pt min guard for sit<>stand transfers, min guard for ambulation of 300 with no AD. Pt had multiple minor LoB with ambulation that required stagger steps to recover. Pt advised to use of AD but refused. Pt also had LoB in Static standing when reaching outside BoS requiring him to catch himself on the counter. Pt requires continued skilled PT in the acute setting to progress gait and balance training to improve safety in his home environment.    Follow Up Recommendations  No PT follow up;Supervision - Intermittent     Equipment Recommendations  Cane (pt refused)    Recommendations for Other Services       Precautions / Restrictions Precautions Precautions: Fall;Shoulder Type of Shoulder Precautions:  (NWB through L UE) Shoulder Interventions: Shoulder sling/immobilizer;At all times Required Braces or Orthoses: Sling Restrictions Weight Bearing Restrictions: Yes LUE Weight Bearing: Non weight bearing    Mobility  Bed Mobility               General bed mobility comments: sitting EoB at entry  Transfers Overall transfer level: Needs assistance Equipment used: None Transfers: Sit to/from Stand Sit to Stand: Min guard         General transfer comment: No assist coming to stand  Ambulation/Gait Ambulation/Gait assistance: Min guard;Min assist Ambulation Distance (Feet): 300 Feet Assistive device: None Gait Pattern/deviations: Step-through pattern;Decreased stride length;Drifts right/left (drifts left) Gait velocity: slowed Gait velocity interpretation: Below normal speed for age/gender General Gait Details: Pt ambulates with slight unsteadiness. Pt had  2x LoB requiring stagger steps to R to recover. Pt also caught R shoulder on wall turning corner to his R. Requiring multiple steps to recover.    Stairs            Wheelchair Mobility    Modified Rankin (Stroke Patients Only) Modified Rankin (Stroke Patients Only) Pre-Morbid Rankin Score: No significant disability Modified Rankin: Slight disability     Balance Overall balance assessment: Needs assistance Sitting-balance support: No upper extremity supported;Feet supported Sitting balance-Leahy Scale: Fair Sitting balance - Comments: able to but on both socks seated in straight chair    Standing balance support: No upper extremity supported;During functional activity Standing balance-Leahy Scale: Poor Standing balance comment: upon reaching outside BoS had loss of balance requiring him to grab counter to gain balance.                             Cognition Arousal/Alertness: Awake/alert Behavior During Therapy: WFL for tasks assessed/performed Overall Cognitive Status: Within Functional Limits for tasks assessed                                           General Comments General comments (skin integrity, edema, etc.): Pt would benefit from use of AD but refused       Pertinent Vitals/Pain Pain Assessment: 0-10 Pain Score: 3  Pain Location: left UE and back Pain Descriptors / Indicators: Aching;Grimacing;Guarding;Sore Pain Intervention(s): Limited activity within patient's tolerance;Monitored during session  VSS         PT Goals (current goals can now be found in the care plan section) Acute Rehab PT Goals Patient Stated Goal: to get home PT Goal Formulation: With patient Time For Goal Achievement: 08/31/16 Potential to Achieve Goals: Good Progress towards PT goals: Progressing toward goals    Frequency    Min 4X/week      PT Plan Current plan remains appropriate       End of Session Equipment Utilized During  Treatment: Gait belt Activity Tolerance: Patient tolerated treatment well Patient left: with call bell/phone within reach;with nursing/sitter in room (left standing in room with nursing) Nurse Communication: Mobility status PT Visit Diagnosis: Muscle weakness (generalized) (M62.81);Unsteadiness on feet (R26.81)     Time: 3491-7915 PT Time Calculation (min) (ACUTE ONLY): 11 min  Charges:  $Gait Training: 8-22 mins                    G Codes:       Quan Cybulski B. Migdalia Dk PT, DPT Acute Rehabilitation  [phone] Pager [phone]     [place] 08/25/2016, 11:53 AM

## 2016-08-25 NOTE — Clinical Social Work Note (Addendum)
Clinical Social Work Assessment  Patient Details  Name: Johnny Mata MRN: [id number] Date of Birth: 02-May-1976  Date of referral:  08/25/16               Reason for consult:  Substance Use/ETOH Abuse    trauma            Permission sought to share information with:    Permission granted to share information::  No  Name::        Agency::     Relationship::     Contact Information:     Housing/Transportation Living arrangements for the past 2 months:  Apartment Source of Information:  Patient Patient Interpreter Needed:  None Criminal Activity/Legal Involvement Pertinent to Current Situation/Hospitalization:  No - Comment as needed Significant Relationships:  Friend Lives with:  Self Do you feel safe going back to the place where you live?  No Need for family participation in patient care:  No (Coment)  Care giving concerns:  Pt lives at home alone and reports frequent drinking- concern for pt safety with current drinking habits given admitting incident.   Social Worker assessment / plan:  CSW met with pt to discuss admitting incident and current ETOH abuse.  Pt states that he was at a friends apartment drinking and the last thing he remembers is his friend going to the bathroom and him going out to the balcony to smoke and then he woke up on the hospital.  Patient is not concerned with the incident and states this is the only incident of him blacking out due to his drinking.  Patient expresses frustration with his lack of mobility right now but is motivated to leave the hospital and get his life back on track/go back to work.  CSW then discussed pt current drinking habits.  Patient is very open about his drinking states he drinks over a 1/5th of vodka 3-4 times a week but does not have an issue stopping when he wants to stop.  Pt states he sometimes drinks in the morning but never when he works and he never has issues completing tasks that he needs to due to drinking.  Patient is not  sure about going to rehab at this time- states he went once when he was 18 after being forced to go but does not have a lot of interest in going at this time.  Pt accepted outpatient rehab list but doesn't think he'll have time to go due to new work schedule.  SBIRT completed  Employment status:  [employer] information:  Self Pay (Medicaid Pending) PT Recommendations:  No Follow Up Information / Referral to community resources:  Outpatient Substance Abuse Treatment Options, Residential Substance Abuse Treatment Options  Patient/Family's Response to care:  Patient seemed somewhat annoyed during interview and does not seem to think he has a problem with drinking despite openly reporting large amounts of consumption.   Patient/Family's Understanding of and Emotional Response to Diagnosis, Current Treatment, and Prognosis:  Patient has clear understanding of his drinking habits but does not seem to think he is dependent or addicted- just states he drinks until he feels good.  Emotional Assessment Appearance:  Appears stated age Attitude/Demeanor/Rapport:    Affect (typically observed):  Frustrated, Guarded Orientation:  Oriented to Self, Oriented to Place, Oriented to  Time, Oriented to Situation Alcohol / Substance use:  Alcohol Use Psych involvement (Current and /or in the community):  No (Comment)  Discharge Needs  Concerns to be addressed:  Care Coordination Readmission within the last 30 days:  No Current discharge risk:  Substance Abuse Barriers to Discharge:  No Barriers Identified   Jorge Ny, LCSW 08/25/2016, 9:55 AM

## 2016-08-25 NOTE — Progress Notes (Signed)
NCM spoke to pt and states he has insurance with his job [employer]. Explained hospital will need a copy of his insurance card to file claim. States when he gets home, he will find away to get information to business office. States he can pick up his meds from pharmacy. Jonnie Finner RN CCM Case Mgmt phone [phone]

## 2016-08-25 NOTE — Discharge Instructions (Signed)
Take keppra prescription to completion. Call Dr. Hewitt Shorts office if you have any concerns or questions.  Recommend taking over the counter iburprofen and tylenol to help with pain control. Use oxycodone only as needed and slowly wean off. Ice can also help your shoulder swelling/pain.  Ok to shower with the wound on the back of your head open. We will take your staples out at your appointment on 09/03/16.

## 2016-08-25 NOTE — Discharge Summary (Signed)
[facility] Discharge Summary   Patient ID: Johnny Mata MRN: [id number] DOB/AGE: 1976/05/11 41 y.o.  Admit date: 08/22/2016 Discharge date: 08/25/2016  Admitting Diagnosis: 68m subcortical hemorrhage left frontal area. Scapular fracture Alcoholism  Discharge Diagnosis Patient Active Problem List   Diagnosis Date Noted  . Alcohol abuse with intoxication ([place] 08/24/2016  . Closed displaced fracture of body of left scapula 08/23/2016  . Cerebral contusion ([facility] 08/22/2016    Consultants BCyndy FreezeMD - neurosurgery TEdmonia LynchMD - orthopedics JAmbrose FinlandMD - psychiatry  Imaging: Ct Head Wo Contrast  Result Date: 08/23/2016 CLINICAL DATA:  Fall from 15 foot window EXAM: CT HEAD WITHOUT CONTRAST TECHNIQUE: Contiguous axial images were obtained from the base of the skull through the vertex without intravenous contrast. COMPARISON:  08/22/2016 FINDINGS: Brain: 9 x 6 x 6 mm (volume = 0.2 mL) parenchymal contusion in the medial right frontal lobe (series 3/image 20), mildly increased. Surrounding mild vasogenic edema. No mass effect or midline shift. No evidence of acute infarction, hydrocephalus, or extra-axial collection. Vascular: No hyperdense vessel or unexpected calcification. Skull: Normal. Negative for fracture or focal lesion. Sinuses/Orbits: The visualized paranasal sinuses are essentially clear. The mastoid air cells are unopacified. Other: Soft tissue swelling/hematoma overlying the left parietal bone. Overlying skin staples. IMPRESSION: 9 x 6 x 6 mm (volume = 0.2 mL) parenchymal contusion in the medial right frontal lobe, mildly increased. Surrounding mild vasogenic edema. No mass effect or midline shift. Soft tissue swelling/ hematoma overlying the left frontal bone. No evidence of calvarial fracture. Electronically Signed   By: SJulian HyM.D.   On: 08/23/2016 15:14   DG shoulder left port 08/22/16: Mildly displaced inferior scapular  fracture.  Procedures None  Hospital Course:  JBRIAN ZEITLINis a 89yomale PMH alcoholism who [facility] out [facility] 2 story window. There was positive LOC and bleeding from head laceration at the scene.  Workup showed 7 mm subcortical hemorrhage left frontal area, and a left scapular fracture. Head laceration stapled in ED. Patient was admitted to trauma for pain control. He was on the CIWA protocol. Neurosurgery consulted and recommended nonoperative management of parenchymal contusion with keppra 5051mBID x7 days. Ortho was consulted for left scapular fracture and recommended nonoperative management with a sling and NWB LUE x2 weeks. Psychiatry was also consulted due to mechanism of injury to rule out suicidal ideation, and patient was deemed no imminent risk to self or others. Pain improved and patient worked with PT during this admission. On 4/10 the patient was voiding well, tolerating diet, ambulating well, pain well controlled, vital signs stable and felt stable for discharge home.  He met with social work who provided him with outpatient alcohol rehab information. He will follow-up with ortho in 2 weeks regarding shoulder injury, and with trauma clinic in 1 week for staple removal. He knows to call with questions or concerns.    I have personally reviewed the patients medication history on the [place] controlled substance database.   Physical Exam: Gen:  Alert, NAD, cooperative Head: posterior scalp laceration with no drainage Card:  RRR, no M/G/R heard Pulm:  CTAB, no W/R/R, effort normal Abd: Soft, NT/ND, +BS, no HSM, no hernia LUE: in sling, fingers WWP with SILT, able to wiggle fingers. Mild swelling in left shoulder with TTP.  Ext:  No erythema, edema, or tenderness BLE  Allergies as of 08/25/2016   No Known Allergies     Medication  List    TAKE these medications   GOODYS PM 38-500 MG Pack Generic drug:  Diphenhydramine-APAP (sleep) Take 1 packet by  mouth See admin instructions. Take 1 packet by mouth upon arrival home at night, then take another packet about an hour later   levETIRAcetam 500 MG tablet Commonly known as:  KEPPRA Take 1 tablet (500 mg total) by mouth 2 (two) times daily.   oxyCODONE 5 MG immediate release tablet Commonly known as:  Oxy IR/ROXICODONE Take 1-2 tablets (5-10 mg total) by mouth every 6 (six) hours as needed for moderate pain or severe pain (5 for mild, 10 for moderate to severe).        Follow-up Information    MURPHY, TIMOTHY D, MD. Schedule an appointment as soon as possible for a visit in 2 week(s).   Specialty:  Orthopedic Surgery Why:  Please call as soon as you leavet the hospital to make a follow-up appointment with Dr. Percell Miller for follow-up for your left shoulder injury Contact information: [address] [place] [address] [phone]        Kevan Ny Ditty, MD. Call.   Specialty:  Neurosurgery Why:  as needed for head injury Contact information: [place] [address] [phone]        [place]. Go on 09/03/2016.   Why:  Your appointment is 09/03/16 at 11:30AM for removal of your staples. Please arrive 30 minutes prior to your appointment to check in and fill out necessary paperwork. Contact information: [place] [address] [phone]          Signed: Jerrye Beavers, [facility] 08/25/2016, 8:36 AM Pager: [phone] Consults: [phone] Mon-Fri 7:00 am-4:30 pm Sat-Sun 7:00 am-11:30 am

## 2016-09-11 ENCOUNTER — Telehealth (HOSPITAL_COMMUNITY): Payer: Self-pay

## 2016-09-11 ENCOUNTER — Encounter (INDEPENDENT_AMBULATORY_CARE_PROVIDER_SITE_OTHER): Payer: Self-pay | Admitting: General Surgery

## 2016-09-11 NOTE — Telephone Encounter (Signed)
Attempted to call patient. Generic voicemail message so did not leave a message. Will attempt to call again later today.

## 2016-09-11 NOTE — Telephone Encounter (Signed)
Spoke with pt and he states he spoke to someone at Dr. Hewitt Shorts office and they informed him that he does not need followup. When asked what his concerns were he stated "I have no concerns". He feels better that he spoke to someone at Ditty'd office.

## 2016-09-11 NOTE — Telephone Encounter (Signed)
[phone] called saying he has been trying to get a hold of Dr. Cyndy Freeze regarding something with his brain.  He seemed very distressed.  Spoke to [employer] at [facility] and she said Dr. Cyndy Freeze did a consult.  He wants someone to call him.

## 2017-02-04 ENCOUNTER — Encounter (HOSPITAL_BASED_OUTPATIENT_CLINIC_OR_DEPARTMENT_OTHER): Payer: Self-pay | Admitting: Emergency Medicine

## 2017-02-04 ENCOUNTER — Emergency Department (HOSPITAL_BASED_OUTPATIENT_CLINIC_OR_DEPARTMENT_OTHER)
Admission: EM | Admit: 2017-02-04 | Discharge: 2017-02-05 | Disposition: A | Discharge status: Home / Self Care | Payer: Commercial Managed Care - PPO | Attending: Emergency Medicine | Admitting: Emergency Medicine

## 2017-02-04 DIAGNOSIS — J45909 Unspecified asthma, uncomplicated: Secondary | ICD-10-CM | POA: Diagnosis not present

## 2017-02-04 DIAGNOSIS — M5412 Radiculopathy, cervical region: Secondary | ICD-10-CM | POA: Diagnosis not present

## 2017-02-04 DIAGNOSIS — Z79899 Other long term (current) drug therapy: Secondary | ICD-10-CM | POA: Insufficient documentation

## 2017-02-04 DIAGNOSIS — F17210 Nicotine dependence, cigarettes, uncomplicated: Secondary | ICD-10-CM | POA: Diagnosis not present

## 2017-02-04 DIAGNOSIS — M542 Cervicalgia: Secondary | ICD-10-CM | POA: Diagnosis present

## 2017-02-04 DIAGNOSIS — M792 Neuralgia and neuritis, unspecified: Secondary | ICD-10-CM

## 2017-02-04 NOTE — ED Triage Notes (Signed)
Pt states he has bone spurs in his neck and is scheduled for nerve shunt placement next week. Pt reports neck pain is worse today than usual, extreme pain with movement.

## 2017-02-04 NOTE — ED Notes (Signed)
Updated to wait time

## 2017-02-04 NOTE — ED Notes (Signed)
Pt offered something for pain but declined at present.

## 2017-02-04 NOTE — ED Provider Notes (Signed)
[place] DEPT [facility] Provider Note   CSN: [id number] Arrival date & time: 02/04/17  1809     History   Chief Complaint Chief Complaint  Patient presents with  . Neck Pain    HPI Johnny Mata is a 41 y.o. male.  HPI   41 year old male with hx of alcohol abuse, bone spur in neck presenting with c/o neck pain.  Pt suffered Neck injury in April.  Was diagnosed with bone spur in neck by Dr. Rolena Infante and Dr. Nelva Bush.  Dr. Nelva Bush is planning on doing a nerve shunt in Oct.  Pt started to notice pain to LUE while at work today.  Pain is described as as sharp shooting tingling sensation with temperature and color change to his L arm.  Pain worse with neck movement especially to the affected side.  Pain improves with rest and with certain position.  No lower extremity discomfort.  No report of fever, chills, headache, lightheadedness, chest pain, abd pain.  He is R hand dominant.  He works on [employer], [employer] and having to flex his neck on a consistent basis.  He denies any recent injury.  No lightheadedness, or dizziness, no cp or sob.  He is not dropping objects.   Past Medical History:  Diagnosis Date  . Anxiety    Never officially diagnosed; does not take medication for anxiety  . Asthma    Hasn't had an episode since childhood    Patient Active Problem List   Diagnosis Date Noted  . Alcohol abuse with intoxication ([place]) 08/24/2016  . Closed displaced fracture of body of left scapula 08/23/2016  . Cerebral contusion ([place]) 08/22/2016    Past Surgical History:  Procedure Laterality Date  . TONSILLECTOMY    . WISDOM TOOTH EXTRACTION Bilateral 1995       Home Medications    Prior to Admission medications   Medication Sig Start Date End Date Taking? Authorizing Provider  Diphenhydramine-APAP, sleep, (GOODYS PM) 38-500 MG PACK Take 1 packet by mouth See admin instructions. Take 1 packet by mouth upon arrival home at night, then take another packet about an hour later    [provider]  levETIRAcetam (KEPPRA) 500 MG tablet Take 1 tablet (500 mg total) by mouth 2 (two) times daily. 08/25/16   Meuth, Brooke A, PA-C  oxyCODONE (OXY IR/ROXICODONE) 5 MG immediate release tablet Take 1-2 tablets (5-10 mg total) by mouth every 6 (six) hours as needed for moderate pain or severe pain (5 for mild, 10 for moderate to severe). 08/25/16   Meuth, Blaine Hamper, PA-C    Family History No family history on file.  Social History Social History  Substance Use Topics  . Smoking status: Current Every Day Smoker    Packs/day: 1.50    Years: 20.00    Types: Cigarettes  . Smokeless tobacco: Never Used  . Alcohol use 4.2 oz/week    7 Shots of liquor per week     Comment: EVERY DAY     Allergies   Patient has no known allergies.   Review of Systems Review of Systems  Musculoskeletal: Positive for arthralgias and neck pain.  Skin: Negative for rash and wound.  Neurological: Positive for numbness. Negative for headaches.  All other systems reviewed and are negative.    Physical Exam Updated Vital Signs BP (!) 153/101 (BP Location: Right Arm)   Pulse 89   Temp 98.7 F (37.1 C) (Oral)   Resp 18   Ht 6' (1.829 m)  Wt 79.4 kg (175 lb)   SpO2 99%   BMI 23.73 kg/m   Physical Exam  Constitutional: He is oriented to person, place, and time. He appears well-developed and well-nourished. No distress.  HENT:  Head: Atraumatic.  Eyes: Conjunctivae are normal.  Neck: Neck supple.  Pain to L paraspinal cervical region, increase with left neck rotation.  No overlying skin changes, no carotid bruits.    Cardiovascular: Normal rate and regular rhythm.   Pulmonary/Chest: Effort normal and breath sounds normal.  Musculoskeletal:  L shoulder with FROM, increasing L arm pain with flexion of shoulder.  Normal ROM to L elbow, L wrist, normal grip strength bilaterally.  Intact distal radial pulse with brisk cap refills to both hands.  Arm compartment are soft.    Neurological: He  is alert and oriented to person, place, and time.  Skin: No rash noted.  Psychiatric: He has a normal mood and affect.  Nursing note and vitals reviewed.    ED Treatments / Results  Labs (all labs ordered are listed, but only abnormal results are displayed) Labs Reviewed - No data to display  EKG  EKG Interpretation None       Radiology No results found.  Procedures Procedures (including critical care time)  Medications Ordered in ED Medications - No data to display   Initial Impression / Assessment and Plan / ED Course  I have reviewed the triage vital signs and the nursing notes.  Pertinent labs & imaging results that were available during my care of the patient were reviewed by me and considered in my medical decision making (see chart for details).     BP (!) 153/101 (BP Location: Right Arm)   Pulse 89   Temp 98.7 F (37.1 C) (Oral)   Resp 18   Ht 6' (1.829 m)   Wt 79.4 kg (175 lb)   SpO2 99%   BMI 23.73 kg/m    Final Clinical Impressions(s) / ED Diagnoses   Final diagnoses:  Radicular pain in left arm  Neck pain    New Prescriptions New Prescriptions   CYCLOBENZAPRINE (FLEXERIL) 10 MG TABLET    Take 1 tablet (10 mg total) by mouth 2 (two) times daily as needed for muscle spasms.   12:22 AM Pt here with radicular pain from L side of neck to L arm, worse with rotation of neck to affected side.  Known hx bone spur in his neck.  He does have adequate f/u with orthopedic surgeon Dr. Rolena Infante and neurosurgeon Dr. Nelva Bush.  His pain is currently controlled.  Intact pulses to BUE, with brisk cap refill.  Increasing radicular pain with axial loading on neck.  For his radiculopathy, I will prescribe a short course of muscle relaxant.  Work note provided.  Recommend close f/u with his specialist for further care.  He will likely benefit from nerve shunt for more advance imaging when deem appropriate by his specialist.  Prompt return precaution discussed.    Domenic Moras, PA-C 02/05/17 Crista Elliot, MD 02/06/17 [phone]

## 2017-02-05 MED ORDER — CYCLOBENZAPRINE HCL 10 MG PO TABS: 10.0000 mg | ORAL_TABLET | Freq: Two times a day (BID) | ORAL | 0 refills | Status: DC | PRN

## 2017-02-05 NOTE — ED Notes (Signed)
ED Provider at bedside. 

## 2017-02-05 NOTE — Discharge Instructions (Signed)
Please call and follow up closely with your surgeon and neurosurgeon for further management of neck and arm pain.  Return promptly if you have numbness of your arm, worsening neck pain, or if you have other concerns.

## 2017-03-25 ENCOUNTER — Other Ambulatory Visit: Payer: Self-pay | Admitting: General Surgery

## 2017-03-25 DIAGNOSIS — K4090 Unilateral inguinal hernia, without obstruction or gangrene, not specified as recurrent: Secondary | ICD-10-CM

## 2017-03-30 ENCOUNTER — Ambulatory Visit
Admission: RE | Admit: 2017-03-30 | Discharge: 2017-03-30 | Disposition: A | Discharge status: Home / Self Care | Payer: Commercial Managed Care - PPO | Source: Ambulatory Visit | Attending: General Surgery | Admitting: General Surgery

## 2017-03-30 DIAGNOSIS — K4090 Unilateral inguinal hernia, without obstruction or gangrene, not specified as recurrent: Secondary | ICD-10-CM

## 2018-05-03 DIAGNOSIS — F419 Anxiety disorder, unspecified: Secondary | ICD-10-CM | POA: Insufficient documentation

## 2018-05-09 HISTORY — PX: NECK SURGERY: SHX720

## 2021-08-19 ENCOUNTER — Encounter (HOSPITAL_COMMUNITY): Payer: Self-pay | Admitting: Emergency Medicine

## 2021-08-19 ENCOUNTER — Emergency Department (HOSPITAL_COMMUNITY): Payer: Commercial Managed Care - PPO

## 2021-08-19 ENCOUNTER — Observation Stay (HOSPITAL_COMMUNITY)
Admission: EM | Admit: 2021-08-19 | Discharge: 2021-08-20 | Disposition: A | Discharge status: Home / Self Care | Payer: Commercial Managed Care - PPO | Attending: Otolaryngology | Admitting: Otolaryngology

## 2021-08-19 ENCOUNTER — Emergency Department (HOSPITAL_BASED_OUTPATIENT_CLINIC_OR_DEPARTMENT_OTHER): Payer: Commercial Managed Care - PPO | Admitting: Anesthesiology

## 2021-08-19 ENCOUNTER — Encounter (HOSPITAL_COMMUNITY)
Admission: EM | Disposition: A | Discharge status: Home / Self Care | Payer: Self-pay | Source: Home / Self Care | Attending: Emergency Medicine

## 2021-08-19 ENCOUNTER — Emergency Department (HOSPITAL_COMMUNITY): Payer: Commercial Managed Care - PPO | Admitting: Anesthesiology

## 2021-08-19 DIAGNOSIS — J358 Other chronic diseases of tonsils and adenoids: Secondary | ICD-10-CM

## 2021-08-19 DIAGNOSIS — C090 Malignant neoplasm of tonsillar fossa: Secondary | ICD-10-CM | POA: Diagnosis not present

## 2021-08-19 DIAGNOSIS — Z87891 Personal history of nicotine dependence: Secondary | ICD-10-CM

## 2021-08-19 DIAGNOSIS — J392 Other diseases of pharynx: Secondary | ICD-10-CM | POA: Diagnosis not present

## 2021-08-19 DIAGNOSIS — C051 Malignant neoplasm of soft palate: Secondary | ICD-10-CM | POA: Insufficient documentation

## 2021-08-19 DIAGNOSIS — Z20822 Contact with and (suspected) exposure to covid-19: Secondary | ICD-10-CM | POA: Insufficient documentation

## 2021-08-19 DIAGNOSIS — R070 Pain in throat: Secondary | ICD-10-CM | POA: Diagnosis present

## 2021-08-19 HISTORY — PX: PANENDOSCOPY: SHX2159

## 2021-08-19 HISTORY — DX: Depression, unspecified: F32.A

## 2021-08-19 LAB — COMPREHENSIVE METABOLIC PANEL
ALT: 12 U/L (ref 0–44)
AST: 16 U/L (ref 15–41)
Albumin: 4.1 g/dL (ref 3.5–5.0)
Alkaline Phosphatase: 57 U/L (ref 38–126)
Anion gap: 11 (ref 5–15)
BUN: 7 mg/dL (ref 6–20)
CO2: 25 mmol/L (ref 22–32)
Calcium: 9.3 mg/dL (ref 8.9–10.3)
Chloride: 102 mmol/L (ref 98–111)
Creatinine, Ser: 0.86 mg/dL (ref 0.61–1.24)
GFR, Estimated: >60 mL/min (ref >60)
Glucose, Bld: 107 mg/dL — ABNORMAL HIGH (ref 70–99)
Potassium: 3.8 mmol/L (ref 3.5–5.1)
Sodium: 138 mmol/L (ref 135–145)
Total Bilirubin: 0.5 mg/dL (ref 0.3–1.2)
Total Protein: 6.8 g/dL (ref 6.5–8.1)

## 2021-08-19 LAB — CBC WITH DIFFERENTIAL/PLATELET
Abs Immature Granulocytes: 0.04 10*3/uL (ref 0.00–0.07)
Basophils Absolute: 0.1 10*3/uL (ref 0.0–0.1)
Basophils Relative: 1 %
Eosinophils Absolute: 0.2 10*3/uL (ref 0.0–0.5)
Eosinophils Relative: 2 %
HCT: 43.3 % (ref 39.0–52.0)
Hemoglobin: 14.9 g/dL (ref 13.0–17.0)
Immature Granulocytes: 0 %
Lymphocytes Relative: 20 %
Lymphs Abs: 1.9 10*3/uL (ref 0.7–4.0)
MCH: 31 pg (ref 26.0–34.0)
MCHC: 34.4 g/dL (ref 30.0–36.0)
MCV: 90 fL (ref 80.0–100.0)
Monocytes Absolute: 0.8 10*3/uL (ref 0.1–1.0)
Monocytes Relative: 8 %
Neutro Abs: 6.9 10*3/uL (ref 1.7–7.7)
Neutrophils Relative %: 69 %
Platelets: 272 10*3/uL (ref 150–400)
RBC: 4.81 MIL/uL (ref 4.22–5.81)
RDW: 12.1 % (ref 11.5–15.5)
WBC: 9.9 10*3/uL (ref 4.0–10.5)
nRBC: 0 % (ref 0.0–0.2)

## 2021-08-19 LAB — GROUP A STREP BY PCR: Group A Strep by PCR: NOT DETECTED

## 2021-08-19 LAB — RESP PANEL BY RT-PCR (FLU A&B, COVID) ARPGX2
Influenza A by PCR: NEGATIVE
Influenza B by PCR: NEGATIVE
SARS Coronavirus 2 by RT PCR: NEGATIVE

## 2021-08-19 LAB — LACTIC ACID, PLASMA: Lactic Acid, Venous: 1.5 mmol/L (ref 0.5–1.9)

## 2021-08-19 SURGERY — LARYNGOSCOPY, WITH BRONCHOSCOPY AND ESOPHAGOSCOPY
Anesthesia: General | Site: Throat

## 2021-08-19 MED ORDER — IOHEXOL 350 MG/ML SOLN
65.0000 mL | Freq: Once | INTRAVENOUS | Status: AC | PRN
Start: 2021-08-19 — End: 2021-08-19
  Administered 2021-08-19: 65 mL via INTRAVENOUS

## 2021-08-19 MED ORDER — LIDOCAINE 2% (20 MG/ML) 5 ML SYRINGE
INTRAMUSCULAR | Status: AC
  Filled 2021-08-19: qty 5

## 2021-08-19 MED ORDER — DEXAMETHASONE SODIUM PHOSPHATE 10 MG/ML IJ SOLN
INTRAMUSCULAR | Status: AC
  Filled 2021-08-19: qty 1

## 2021-08-19 MED ORDER — SERTRALINE HCL 100 MG PO TABS
100.0000 mg | ORAL_TABLET | Freq: Every day | ORAL | Status: DC
  Administered 2021-08-20: 100 mg via ORAL
  Filled 2021-08-19: qty 1

## 2021-08-19 MED ORDER — FENTANYL CITRATE (PF) 250 MCG/5ML IJ SOLN
INTRAMUSCULAR | Status: DC | PRN
  Administered 2021-08-19: 100 ug via INTRAVENOUS

## 2021-08-19 MED ORDER — CELECOXIB 200 MG PO CAPS
ORAL_CAPSULE | ORAL | Status: AC
  Administered 2021-08-19: 200 mg via ORAL
  Filled 2021-08-19: qty 1

## 2021-08-19 MED ORDER — SUCCINYLCHOLINE CHLORIDE 200 MG/10ML IV SOSY
PREFILLED_SYRINGE | INTRAVENOUS | Status: AC
  Filled 2021-08-19: qty 10

## 2021-08-19 MED ORDER — OXYCODONE-ACETAMINOPHEN 7.5-325 MG PO TABS
1.0000 | ORAL_TABLET | ORAL | Status: DC | PRN
  Administered 2021-08-20 (×2): 2 via ORAL
  Filled 2021-08-19 (×2): qty 2

## 2021-08-19 MED ORDER — PROPOFOL 10 MG/ML IV BOLUS
INTRAVENOUS | Status: AC
  Filled 2021-08-19: qty 20

## 2021-08-19 MED ORDER — SUGAMMADEX SODIUM 200 MG/2ML IV SOLN
INTRAVENOUS | Status: DC | PRN
  Administered 2021-08-19: 200 mg via INTRAVENOUS

## 2021-08-19 MED ORDER — CELECOXIB 200 MG PO CAPS: 200.0000 mg | ORAL_CAPSULE | Freq: Once | ORAL | Status: AC

## 2021-08-19 MED ORDER — EPINEPHRINE PF 1 MG/ML IJ SOLN
INTRAMUSCULAR | Status: AC
  Filled 2021-08-19: qty 2

## 2021-08-19 MED ORDER — 0.9 % SODIUM CHLORIDE (POUR BTL) OPTIME
TOPICAL | Status: DC | PRN
  Administered 2021-08-19: 1000 mL

## 2021-08-19 MED ORDER — DIPHENHYDRAMINE HCL 25 MG PO CAPS: 25.0000 mg | ORAL_CAPSULE | Freq: Every day | ORAL | Status: DC | PRN

## 2021-08-19 MED ORDER — CHLORHEXIDINE GLUCONATE 0.12 % MT SOLN: 15.0000 mL | Freq: Once | OROMUCOSAL | Status: DC

## 2021-08-19 MED ORDER — HYDROMORPHONE HCL 1 MG/ML IJ SOLN
1.0000 mg | Freq: Once | INTRAMUSCULAR | Status: AC
Start: 2021-08-19 — End: 2021-08-19
  Administered 2021-08-19: 1 mg via INTRAVENOUS
  Filled 2021-08-19: qty 1

## 2021-08-19 MED ORDER — ONDANSETRON HCL 4 MG/2ML IJ SOLN
INTRAMUSCULAR | Status: AC
  Filled 2021-08-19: qty 2

## 2021-08-19 MED ORDER — ACETAMINOPHEN 500 MG PO TABS
ORAL_TABLET | ORAL | Status: AC
  Administered 2021-08-19: 1000 mg via ORAL
  Filled 2021-08-19: qty 2

## 2021-08-19 MED ORDER — AMISULPRIDE (ANTIEMETIC) 5 MG/2ML IV SOLN: 10.0000 mg | Freq: Once | INTRAVENOUS | Status: DC | PRN

## 2021-08-19 MED ORDER — ARTIFICIAL TEARS OPHTHALMIC OINT
TOPICAL_OINTMENT | OPHTHALMIC | Status: AC
  Filled 2021-08-19: qty 3.5

## 2021-08-19 MED ORDER — CHLORHEXIDINE GLUCONATE 0.12 % MT SOLN
OROMUCOSAL | Status: AC
  Filled 2021-08-19: qty 15

## 2021-08-19 MED ORDER — LIDOCAINE 2% (20 MG/ML) 5 ML SYRINGE
INTRAMUSCULAR | Status: DC | PRN
  Administered 2021-08-19: 60 mg via INTRAVENOUS

## 2021-08-19 MED ORDER — FENTANYL CITRATE (PF) 100 MCG/2ML IJ SOLN
INTRAMUSCULAR | Status: AC
  Filled 2021-08-19: qty 2

## 2021-08-19 MED ORDER — KCL IN DEXTROSE-NACL 20-5-0.45 MEQ/L-%-% IV SOLN
INTRAVENOUS | Status: DC
  Filled 2021-08-19: qty 1000

## 2021-08-19 MED ORDER — MIDAZOLAM HCL 2 MG/2ML IJ SOLN
INTRAMUSCULAR | Status: DC | PRN
Start: 2021-08-19 — End: 2021-08-19
  Administered 2021-08-19: 2 mg via INTRAVENOUS

## 2021-08-19 MED ORDER — MORPHINE SULFATE (PF) 4 MG/ML IV SOLN
4.0000 mg | Freq: Once | INTRAVENOUS | Status: AC
  Administered 2021-08-19: 4 mg via INTRAVENOUS
  Filled 2021-08-19: qty 1

## 2021-08-19 MED ORDER — ORAL CARE MOUTH RINSE: 15.0000 mL | Freq: Once | OROMUCOSAL | Status: DC

## 2021-08-19 MED ORDER — ACETAMINOPHEN 500 MG PO TABS: 1000.0000 mg | ORAL_TABLET | Freq: Once | ORAL | Status: AC

## 2021-08-19 MED ORDER — ROCURONIUM BROMIDE 10 MG/ML (PF) SYRINGE
PREFILLED_SYRINGE | INTRAVENOUS | Status: DC | PRN
  Administered 2021-08-19: 50 mg via INTRAVENOUS

## 2021-08-19 MED ORDER — DEXAMETHASONE SODIUM PHOSPHATE 10 MG/ML IJ SOLN
INTRAMUSCULAR | Status: DC | PRN
  Administered 2021-08-19: 10 mg via INTRAVENOUS

## 2021-08-19 MED ORDER — OXYMETAZOLINE HCL 0.05 % NA SOLN
NASAL | Status: DC | PRN
  Administered 2021-08-19: 1 via TOPICAL

## 2021-08-19 MED ORDER — HYDROCODONE-ACETAMINOPHEN 5-325 MG PO TABS
2.0000 | ORAL_TABLET | Freq: Once | ORAL | Status: DC
  Filled 2021-08-19: qty 2

## 2021-08-19 MED ORDER — BOOST / RESOURCE BREEZE PO LIQD CUSTOM: 1.0000 | Freq: Three times a day (TID) | ORAL | Status: DC

## 2021-08-19 MED ORDER — SUCCINYLCHOLINE CHLORIDE 200 MG/10ML IV SOSY
PREFILLED_SYRINGE | INTRAVENOUS | Status: DC | PRN
  Administered 2021-08-19: 100 mg via INTRAVENOUS

## 2021-08-19 MED ORDER — ONDANSETRON HCL 4 MG/2ML IJ SOLN
INTRAMUSCULAR | Status: DC | PRN
  Administered 2021-08-19: 4 mg via INTRAVENOUS

## 2021-08-19 MED ORDER — OXYMETAZOLINE HCL 0.05 % NA SOLN
NASAL | Status: AC
  Filled 2021-08-19: qty 30

## 2021-08-19 MED ORDER — MIDAZOLAM HCL 2 MG/2ML IJ SOLN
INTRAMUSCULAR | Status: AC
  Filled 2021-08-19: qty 2

## 2021-08-19 MED ORDER — LACTATED RINGERS IV BOLUS
1000.0000 mL | Freq: Once | INTRAVENOUS | Status: AC
  Administered 2021-08-19: 1000 mL via INTRAVENOUS

## 2021-08-19 MED ORDER — PROPOFOL 10 MG/ML IV BOLUS
INTRAVENOUS | Status: DC | PRN
  Administered 2021-08-19: 160 mg via INTRAVENOUS

## 2021-08-19 MED ORDER — FENTANYL CITRATE (PF) 250 MCG/5ML IJ SOLN
INTRAMUSCULAR | Status: AC
  Filled 2021-08-19: qty 5

## 2021-08-19 MED ORDER — LACTATED RINGERS IV SOLN: INTRAVENOUS | Status: DC

## 2021-08-19 MED ORDER — FENTANYL CITRATE (PF) 100 MCG/2ML IJ SOLN
25.0000 ug | INTRAMUSCULAR | Status: DC | PRN
  Administered 2021-08-19 (×2): 50 ug via INTRAVENOUS

## 2021-08-19 SURGICAL SUPPLY — 28 items
BAG COUNTER SPONGE SURGICOUNT (BAG) ×1 IMPLANT
BLADE SURG 15 STRL LF DISP TIS (BLADE) IMPLANT
BLADE SURG 15 STRL SS (BLADE)
CANISTER SUCT 3000ML PPV (MISCELLANEOUS) ×2 IMPLANT
CNTNR URN SCR LID CUP LEK RST (MISCELLANEOUS) ×2 IMPLANT
CONT SPEC 4OZ STRL OR WHT (MISCELLANEOUS) ×1
COVER BACK TABLE 60X90IN (DRAPES) ×2 IMPLANT
DRAPE HALF SHEET 40X57 (DRAPES) ×2 IMPLANT
DRSG TELFA 3X8 NADH (GAUZE/BANDAGES/DRESSINGS) ×2 IMPLANT
GAUZE SPONGE 4X4 12PLY STRL (GAUZE/BANDAGES/DRESSINGS) ×1 IMPLANT
GLOVE SURG ENC MOIS LTX SZ7 (GLOVE) ×2 IMPLANT
GUARD TEETH (MISCELLANEOUS) ×1 IMPLANT
KIT TURNOVER KIT B (KITS) ×2 IMPLANT
MARKER SKIN DUAL TIP RULER LAB (MISCELLANEOUS) IMPLANT
NDL HYPO 25GX1X1/2 BEV (NEEDLE) IMPLANT
NEEDLE HYPO 25GX1X1/2 BEV (NEEDLE) IMPLANT
NS IRRIG 1000ML POUR BTL (IV SOLUTION) ×2 IMPLANT
PAD ARMBOARD 7.5X6 YLW CONV (MISCELLANEOUS) ×4 IMPLANT
PAD DRESSING TELFA 3X8 NADH (GAUZE/BANDAGES/DRESSINGS) IMPLANT
PATTIES SURGICAL .5 X.5 (GAUZE/BANDAGES/DRESSINGS) ×1 IMPLANT
SPONGE INTESTINAL PEANUT (DISPOSABLE) IMPLANT
SPONGE NEURO XRAY DETECT 1X3 (DISPOSABLE) ×1 IMPLANT
SPONGE TONSIL 1 RF SGL (DISPOSABLE) ×1 IMPLANT
SURGILUBE 2OZ TUBE FLIPTOP (MISCELLANEOUS) ×2 IMPLANT
TOWEL GREEN STERILE FF (TOWEL DISPOSABLE) ×4 IMPLANT
TRAP SPECIMEN MUCUS 40CC (MISCELLANEOUS) IMPLANT
TUBE CONNECTING 12X1/4 (SUCTIONS) ×2 IMPLANT
WATER STERILE IRR 1000ML POUR (IV SOLUTION) ×1 IMPLANT

## 2021-08-19 NOTE — ED Provider Notes (Signed)
?[facility] ?Provider Note ? ? ?CSN: [id number] ?Arrival date & time: 08/19/21  0956 ? ?  ? ?History ? ?Chief Complaint  ?Patient presents with  ? Facial Pain  ? ? ?Johnny Mata is a 46 y.o. male with history of cervical spinal fusion in December 2019 who presents to the ED for evaluation of mouth pain and discharge.  Patient states that 3 days ago he developed severe pain on the posterior roof of his mouth and began coughing up gray chunks of tissue that he notes feels like it is coming from the roof of his mouth.  Patient states that when he had his cervical spinal fusion, he was told that one of the risks is "the roof of my mouth can come off and I will start spitting out gray fluid" and he feels that this is currently what is happening.  Patient is having significant pain upon swallowing although denies difficulty breathing.  He denies fevers, chills, runny nose, cough. ? ?Of note, patient states that he had his childhood vaccines but reports that he "does not do vaccines." ? ?HPI ? ?  ? ?Home Medications ?Prior to Admission medications   ?Medication Sig Start Date End Date Taking? Authorizing Provider  ?cyclobenzaprine (FLEXERIL) 10 MG tablet Take 1 tablet (10 mg total) by mouth 2 (two) times daily as needed for muscle spasms. 02/05/17   Domenic Moras, PA-C  ?Diphenhydramine-APAP, sleep, (GOODYS PM) 38-500 MG PACK Take 1 packet by mouth See admin instructions. Take 1 packet by mouth upon arrival home at night, then take another packet about an hour later    [provider]  ?levETIRAcetam (KEPPRA) 500 MG tablet Take 1 tablet (500 mg total) by mouth 2 (two) times daily. 08/25/16   Meuth, Brooke A, PA-C  ?oxyCODONE (OXY IR/ROXICODONE) 5 MG immediate release tablet Take 1-2 tablets (5-10 mg total) by mouth every 6 (six) hours as needed for moderate pain or severe pain (5 for mild, 10 for moderate to severe). 08/25/16   Meuth, Blaine Hamper, PA-C  ?   ? ?Allergies    ?Patient  has no known allergies.   ? ?Review of Systems   ?Review of Systems ? ?Physical Exam ?Updated Vital Signs ?BP (!) 137/107 (BP Location: Right Arm)   Pulse (!) 114   Temp 98.8 ?F (37.1 ?C) (Oral)   Resp 14   SpO2 96%  ?Physical Exam ?Vitals and nursing note reviewed.  ?Constitutional:   ?   General: He is not in acute distress. ?   Appearance: He is not ill-appearing.  ?HENT:  ?   Head: Atraumatic.  ?   Mouth/Throat:  ? ?   Comments: Uvula mildly deviated left.  Right oropharynx grossly erythematous with a large swath of gray membranous discharge ?Eyes:  ?   Conjunctiva/sclera: Conjunctivae normal.  ?Neck:  ? ?   Comments: Tenderness to right anterior cervical chain ?Cardiovascular:  ?   Rate and Rhythm: Normal rate and regular rhythm.  ?   Pulses: Normal pulses.  ?   Heart sounds: No murmur heard. ?Pulmonary:  ?   Effort: Pulmonary effort is normal. No respiratory distress.  ?   Breath sounds: Normal breath sounds.  ?Abdominal:  ?   General: Abdomen is flat. There is no distension.  ?   Palpations: Abdomen is soft.  ?   Tenderness: There is no abdominal tenderness.  ?Musculoskeletal:     ?   General: Normal range of motion.  ?  Cervical back: Normal range of motion.  ?Skin: ?   General: Skin is warm and dry.  ?   Capillary Refill: Capillary refill takes less than 2 seconds.  ?Neurological:  ?   General: No focal deficit present.  ?   Mental Status: He is alert.  ?Psychiatric:     ?   Mood and Affect: Mood normal.  ? ? ?ED Results / Procedures / Treatments   ?Labs ?(all labs ordered are listed, but only abnormal results are displayed) ?Labs Reviewed  ?COMPREHENSIVE METABOLIC PANEL - Abnormal; Notable for the following components:  ?    Result Value  ? Glucose, Bld 107 (*)   ? All other components within normal limits  ?LACTIC ACID, PLASMA  ?CBC WITH DIFFERENTIAL/PLATELET  ?LACTIC ACID, PLASMA  ? ? ?EKG ?None ? ?Radiology ?No results found. ? ?Procedures ?Procedures  ? ? ?Medications Ordered in ED ?Medications   ?lactated ringers bolus 1,000 mL (has no administration in time range)  ?morphine (PF) 4 MG/ML injection 4 mg (has no administration in time range)  ? ? ?ED Course/ Medical Decision Making/ A&P ?  ?                        ?Medical Decision Making ?Amount and/or Complexity of Data Reviewed ?Labs: ordered. ?Radiology: ordered. ? ?Risk ?Prescription drug management. ? ? ?History:  ?Per HPI ?Social determinants of health: unvaccinated ? ?Initial impression: ? ?This patient presents to the ED for concern of throat pain and discharge, this involves an extensive number of treatment options, and is a complaint that carries with it a high risk of complications and morbidity.   Emergent differentials considered include strep, peritonsillar abscess, neoplasm, diptheria ? ? ?Lab Tests and EKG: ? ?I Ordered, reviewed, and interpreted labs and EKG.  The pertinent results include:  ?CMP, CBC, lactic acid unremarkable ?Pending strep PCR, aerobic throat culture ? ? ?Imaging Studies ordered: ? ?I ordered imaging studies including  ?CT soft tissue neck with contrast and CT maxillofacial with contrast ?I independently visualized and interpreted imaging and I agree with the radiologist interpretation.  ? ? ?Cardiac Monitoring: ? ?The patient was maintained on a cardiac monitor.  I personally viewed and interpreted the cardiac monitored which showed an underlying rhythm of: NSR ? ? ?Medicines ordered and prescription drug management: ? ?I ordered medication including: ?1 L LR bolus ?Morhpine '4mg'$    ?Reevaluation of the patient after these medicines showed that the patient improved ?I have reviewed the patients home medicines and have made adjustments as needed ? ? ?ED Course: ?46 year old male presents to the ED for evaluation of throat pain with reports of gray discharge being coughed up.  Physical exam significant for erythema of the right oropharynx with thin gray membrane.  Airway otherwise intact.  Patient's clinical presentation is  very similar to classic diphtheria, and exam findings were confirmed by my attending Dr. Armandina Gemma.  Labs are unremarkable.  CT neck pending.  Patient care handed off to Dr. Armandina Gemma who will monitor labs and imaging and determine ultimate treatment and disposition. ? ? ?Final Clinical Impression(s) / ED Diagnoses ?Final diagnoses:  ?None  ? ? ?Rx / DC Orders ?ED Discharge Orders   ? ? None  ? ?  ? ? ?  ?Tonye Pearson, [place] ?08/19/21 1715 ? ?  ?Regan Lemming, MD ?08/19/21 2038 ? ?

## 2021-08-19 NOTE — Anesthesia Preprocedure Evaluation (Addendum)
Anesthesia Evaluation  ?Patient identified by MRN, date of birth, ID band ?Patient awake ? ? ? ?Reviewed: ?Allergy & Precautions, NPO status , Patient's Chart, lab work & pertinent test results ? ?History of Anesthesia Complications ?Negative for: history of anesthetic complications ? ?Airway ?Mallampati: III ? ?TM Distance: >3 FB ?Neck ROM: Full ? ? ? Dental ? ?(+) Caps, Dental Advisory Given ?  ?Pulmonary ?Current Smoker, former smoker,  ?  ?Pulmonary exam normal ? ? ? ? ? ? ? Cardiovascular ?negative cardio ROS ?Normal cardiovascular exam ? ? ?  ?Neuro/Psych ?PSYCHIATRIC DISORDERS Anxiety negative neurological ROS ?   ? GI/Hepatic ?negative GI ROS, Neg liver ROS,   ?Endo/Other  ?negative endocrine ROS ? Renal/GU ?negative Renal ROS  ? ?  ?Musculoskeletal ?negative musculoskeletal ROS ?(+)  ? Abdominal ?  ?Peds ? Hematology ?negative hematology ROS ?(+)   ?Anesthesia Other Findings ? ? Reproductive/Obstetrics ? ?  ? ? ? ? ? ? ? ? ? ? ? ? ? ?  ?  ? ? ? ? ? ? ? ?Anesthesia Physical ?Anesthesia Plan ? ?ASA: 2 ? ?Anesthesia Plan: General  ? ?Post-op Pain Management: Celebrex PO (pre-op)* and Tylenol PO (pre-op)*  ? ?Induction: Intravenous ? ?PONV Risk Score and Plan: 3 and Ondansetron, Dexamethasone and Midazolam ? ?Airway Management Planned: Oral ETT ? ?Additional Equipment:  ? ?Intra-op Plan:  ? ?Post-operative Plan: Extubation in OR ? ?Informed Consent: I have reviewed the patients History and Physical, chart, labs and discussed the procedure including the risks, benefits and alternatives for the proposed anesthesia with the patient or authorized representative who has indicated his/her understanding and acceptance.  ? ? ? ?Dental advisory given ? ?Plan Discussed with: Anesthesiologist ? ?Anesthesia Plan Comments:   ? ? ? ? ? ?Anesthesia Quick Evaluation ? ?

## 2021-08-19 NOTE — Transfer of Care (Signed)
Immediate Anesthesia Transfer of Care Note ? ?Patient: Johnny Mata ? ?Procedure(s) Performed: PANENDOSCOPY WITH BIPOSY--direct laryngoscopy, flexible bronchoscopy, rigid esophagoscopy, biopsy of lesion in throat (Throat) ? ?Patient Location: PACU ? ?Anesthesia Type:General ? ?Level of Consciousness: awake, alert  and oriented ? ?Airway & Oxygen Therapy: Patient Spontanous Breathing ? ?Post-op Assessment: Report given to RN and Post -op Vital signs reviewed and stable ? ?Post vital signs: Reviewed and stable ? ?Last Vitals:  ?Vitals Value Taken Time  ?BP 162/86 08/19/21 1924  ?Temp 36.6 ?C 08/19/21 1925  ?Pulse 84 08/19/21 1928  ?Resp 9 08/19/21 1928  ?SpO2 100 % 08/19/21 1928  ?Vitals shown include unvalidated device data. ? ?Last Pain:  ?Vitals:  ? 08/19/21 1752  ?TempSrc: Oral  ?PainSc: 1   ?   ? ?Patients Stated Pain Goal: 5 (08/19/21 1752) ? ?Complications: No notable events documented. ?

## 2021-08-19 NOTE — ED Provider Notes (Signed)
?  Physical Exam  ?BP (!) 144/101   Pulse 77   Temp 98.8 ?F (37.1 ?C) (Oral)   Resp 16   SpO2 96%  ? ? ? ?Procedures  ?Procedures ? ?ED Course / MDM  ?  ?Medical Decision Making ?Amount and/or Complexity of Data Reviewed ?Labs: ordered. ?Radiology: ordered. ? ?Risk ?Prescription drug management. ? ? ?Received the patient in signout from [place].  Pending CT imaging of the head and neck. ? ? ?   ?IMPRESSION:  ?1. Mucosal hyperenhancement in the right nasopharynx extending to  ?the oropharynx with masslike soft tissue density in the region of  ?the right palatine tonsil is most suspicious for malignancy, with  ?less likely differential including infection (tonsillopharyngitis).  ?Recommend ENT consultation and direct visualization.  ?2. 1.8 cm x 1.5 cm x 2.4 cm peripherally enhancing hypodense lesion  ?in the lateral right retropharynx is suspicious for a necrotic node  ?with additional prominent right level II lymph nodes measuring up to  ?1.3 cm in short axis with suspected extracapsular extension at level  ?IIB.  ?3. Postsurgical changes reflecting C4 through C6 ACDF. The plate is  ?backed out by approximately 3 mm from the anterior C4 endplate.  ?   ? ? ?Discussed the likely diagnosis of potential malignancy with the patient bedside.  Spoke with Dr. Marcelline Deist of ENT on-call who will come evaluate the patient in person, likely take the patient to the OR today for biopsy in the OR.  The patient was made NPO.  He was administered IV pain medicine.  Signout given to Dr. Alvino Chapel at 1430. ? ? ? ?  ?Regan Lemming, MD ?08/19/21 1623 ? ?

## 2021-08-19 NOTE — Consult Note (Addendum)
Subjective:  ?HISTORY AND PHYSICAL - ADMISSION  ? ? ? Johnny Mata is a 46 y.o. male who presents for evaluation of right-sided throat pain.  Its been present for quite some time but it has worsened over the past 2 weeks to the point where he is having significant trouble swallowing.  He is lost 20 pounds over the past 6 weeks.  He denies any hemoptysis.  He has no shortness of breath or change in his voice.  He has a significant smoking and drinking history but has not had a drop of alcohol in 5 years and quit smoking 2 years ago.  He denies chest pain or shortness of breath with exercise. ? ?Patient History:  The following portions of the patient's history were reviewed and updated as appropriate: allergies, current medications, past family history, past medical history, past social history, past surgical history, and problem list. ? ?Review of Systems ?Pertinent items are noted in HPI.  ?  ?Objective:  ? ? BP (!) 147/100   Pulse 79   Temp 98.8 ?F (37.1 ?C) (Oral)   Resp 16   SpO2 95%  ? ?Alert and oriented x 3 in no acute distress.  Communicates in complete sentences without shortness of breath ?Pupils equal round and reactive to light.  Gaze conjugate ?Skin without petechiae or suspicious head neck lesions ?Face covered in a beard but no obvious bony deformities or sinus tenderness ?Septal deviation present without nasal mass.  External nose normal ?Pinna normal and mastoids nontender ?Chest clear to auscultation bilaterally ?Heart regular rate and rhythm ?Cranial nerves II through XII intact.  No focal motor or sensory deficits noted ?Neck without adenopathy or mass.  Trachea midline ?OC/OP -there is an ulcerative lesion involving the right oropharynx within the post tonsillectomy fossa and extending onto the soft palate. ? ?CT scan - Approximately 2 cm peripherally enhancing mass involving the right lateral hypopharynx and tonsillar fossa.  It appears to extend from the nasopharynx down to the  hypopharyngeal wall.  The airway is widely patent and unaffected.  There is a single questionable right level 2 lymph node normal by size criteria but suspicious in appearance. ? ?Assessment:  ? ?Oropharyngeal mass ?Borderline right cervical adenopathy ?  ?Plan:  ? 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  I will take the patient to the operating room later this afternoon for panendoscopy with biopsy.  He will be admitted postoperatively and discharge arranged once he is able to take adequate p.o. fluids.  Risks were discussed with the patient as well as the likelihood that this represents a cancer.  He voiced an understanding of both and wishes to move forward.  ? ?Consent has been obtained by me.  We will admit to my service postoperatively given he has no active medical issues. ? ? ?

## 2021-08-19 NOTE — ED Triage Notes (Signed)
Patient complains of "spitting out his sinuses". Patient states three days ago he started spitting out "grey chunks of tissue" that he believes are parts of his sinuses. Patient is alert, afebrile, oriented, ambulatory, and in no apparent distress at this time.  ?

## 2021-08-19 NOTE — Anesthesia Procedure Notes (Signed)
Procedure Name: Intubation ?Date/Time: 08/19/2021 6:43 PM ?Performed by: Thelma Comp, CRNA ?Pre-anesthesia Checklist: Patient identified, Emergency Drugs available, Suction available and Patient being monitored ?Patient Re-evaluated:Patient Re-evaluated prior to induction ?Oxygen Delivery Method: Circle System Utilized ?Preoxygenation: Pre-oxygenation with 100% oxygen ?Induction Type: IV induction ?Ventilation: Mask ventilation without difficulty ?Laryngoscope Size: Sabra Heck and 2 ?Grade View: Grade I ?Tube type: Oral ?Number of attempts: 1 ?Airway Equipment and Method: Stylet and Oral airway ?Placement Confirmation: ETT inserted through vocal cords under direct vision, positive ETCO2 and breath sounds checked- equal and bilateral ?Secured at: 22 cm ?Tube secured with: Tape ?Dental Injury: Teeth and Oropharynx as per pre-operative assessment  ? ? ? ? ?

## 2021-08-19 NOTE — Op Note (Signed)
Johnny Mata ?male ?46 y.o. ?08/19/2021 ? ?Procedure(s) and Anesthesia Type: ?   * PANENDOSCOPY WITH BIPOSY--direct laryngoscopy, flexible bronchoscopy, rigid esophagoscopy, biopsy of lesion in throat - General ? ?Surgeon(s) and Role: ?   Marcina Millard, MD - Primary ?  ?    ?Surgeon: Marcina Millard  ? ?Assistants: none ? ?Anesthesia: General endotracheal anesthesia ? ? ?Procedure Detail ? ?PANENDOSCOPY WITH BIPOSY--direct laryngoscopy with biopsy, flexible bronchoscopy, rigid esophagoscopy, biopsy of lesion in throat, nasopharyngoscopy ? ?Findings: ? ?We took the patient to the operating room and intubated him without difficulty.  His eyes were taped and covered in eye pads.  A head wrap was placed and the head of the bed was turned 90 degrees to me.  The intubating laryngoscope was first used to perform a bronchoscopy.  It was normal.  I then used the scope to evaluate the nasopharynx.  The superior aspect of the nasopharynx was normal.  There was an obvious abnormality in the right nasopharynx which seem to involve the soft palate and extended from the tonsillar fossa rather than originating within the fossa of Rosenmuller.  I then performed a rigid cervical esophagoscopy and it was normal. ? ?Palpation revealed a firm mass involving the right tonsillar fossa and extending to involve most of the soft palate across the midline approaching the left tonsillar fossa.  Direct laryngoscopy was performed and confirmed these findings on palpation.  Multiple biopsies were taken from the right tonsillar fossa which appeared to be the nidus of the malignant process both clinically and radiographically.  The right tonsillar fossa was completely replaced with an exophytic necrotic appearing tumor.  Superficial and deep biopsies were taken and sent to pathology.  Biopsies of the soft palate were then taken and sent to pathology.  It should be noted that this process extended into the soft palate, largely replacing  the normal tissue of the soft palate, and traversed across the midline to the opposite tonsillar fossa.  The biopsy of the palate that I took was at the junction of the soft palate and left superior tonsillar fossa. ? ?Bleeding was controlled using Afrin-soaked tonsil balls and pledgets.  All tonsil balls and pledgets were accounted for prior to extubation.  The patient's bleeding was well controlled and his care turned back to anesthesia for wake-up and extubation. ? ? ? ?Estimated Blood Loss:  less than 50 mL ?        ?Drains:  none ? ?Specimens: Multiple biopsies were taken from the right tonsillar fossa.  A single biopsy was taken from the soft palate. ?       ?Complications:  * No complications entered in OR log * ?        ?Disposition: PACU - hemodynamically stable. ?        ?Condition: stable ?   ?

## 2021-08-20 ENCOUNTER — Encounter (HOSPITAL_COMMUNITY): Payer: Self-pay | Admitting: Otolaryngology

## 2021-08-20 ENCOUNTER — Observation Stay (HOSPITAL_COMMUNITY): Payer: Commercial Managed Care - PPO

## 2021-08-20 MED ORDER — IOHEXOL 350 MG/ML SOLN
80.0000 mL | Freq: Once | INTRAVENOUS | Status: AC | PRN
  Administered 2021-08-20: 80 mL via INTRAVENOUS

## 2021-08-20 MED ORDER — OXYCODONE-ACETAMINOPHEN 5-325 MG PO TABS: 1.0000 | ORAL_TABLET | ORAL | 0 refills | Status: DC | PRN

## 2021-08-20 MED ORDER — DIPHENHYDRAMINE HCL 25 MG PO CAPS
25.0000 mg | ORAL_CAPSULE | Freq: Every day | ORAL | 0 refills | Status: DC | PRN
Start: 2021-08-20 — End: 2021-11-10

## 2021-08-20 NOTE — Discharge Summary (Signed)
?  Discharge Summary ? ?Patient was admitted observation last night after going to the operating room for panendoscopy with biopsy of his tonsillar lesion.  He was admitted postoperatively for observation and is ready to go home today.  His pathology, chest CT, and final staging are pending and I have set up appointments with ENT and radiation oncology for next week. ? ?Discharge physical exam unchanged from admission ? ?Discharge medications: Percocet (#35) ? ?Follow-up: ENT and radiation oncology (to be facilitated by Sherlynn Carbon) ? ?Discharge diet: Regular ? ?Pertinent findings on panendoscopy:  The patient has what appears to be a cancer involving the right peritonsillar space and extending into the soft palate across the midline to the pole of the opposite tonsil.  This would at least represent a stage III and possibly a stage IV cancer depending on whether the medial pterygoid or hard palate is involved radiographically.  The patient likely would be best served by radiation therapy if in fact his pathology proves to be squamous cell carcinoma.  I have also asked them to perform HPV testing on the specimen. ?

## 2021-08-20 NOTE — Anesthesia Postprocedure Evaluation (Signed)
Anesthesia Post Note ? ?Patient: LUCIEN BUDNEY ? ?Procedure(s) Performed: PANENDOSCOPY WITH BIPOSY--direct laryngoscopy, flexible bronchoscopy, rigid esophagoscopy, biopsy of lesion in throat (Throat) ? ?  ? ?Patient location during evaluation: PACU ?Anesthesia Type: General ?Level of consciousness: awake and alert ?Pain management: pain level controlled ?Vital Signs Assessment: post-procedure vital signs reviewed and stable ?Respiratory status: spontaneous breathing, nonlabored ventilation, respiratory function stable and patient connected to nasal cannula oxygen ?Cardiovascular status: blood pressure returned to baseline and stable ?Postop Assessment: no apparent nausea or vomiting ?Anesthetic complications: no ? ? ?No notable events documented. ? ?Last Vitals:  ?Vitals:  ? 08/20/21 0006 08/20/21 0411  ?BP: (!) 152/100 (!) 148/101  ?Pulse: 78 70  ?Resp: 17 17  ?Temp: 36.8 ?C 36.8 ?C  ?SpO2: 98% 99%  ?  ?Last Pain:  ?Vitals:  ? 08/20/21 0502  ?TempSrc:   ?PainSc: Asleep  ? ? ?  ?  ?  ?  ?  ?  ? ?March Rummage Leyani Gargus ? ? ? ? ?

## 2021-08-20 NOTE — Progress Notes (Signed)
Patient is off the floor for his chest CT scan.  No issues overnight. ? ?Path pending ? ?Will discharge home today with the following: ? ?Sherlynn Carbon to arrange for ENT follow up Monday of next week. ? ?2.   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 ?3.   Sherlynn Carbon to arrange for radiation oncology follow up next week. ? ?4.   Percocet 5/325 (#35) ?

## 2021-08-21 LAB — CULTURE, GROUP A STREP (THRC)

## 2021-08-22 LAB — SURGICAL PATHOLOGY

## 2021-08-22 NOTE — Progress Notes (Signed)
Oncology Nurse Navigator Documentation  ? ?Placed introductory call to new referral patient Johnny Mata. ?Introduced myself as the H&N oncology nurse navigator that works with Dr. Isidore Moos to whom he has been referred by Dr. Marcelline Deist.  He confirmed understanding of referral. ?Briefly explained my role as his navigator, provided my contact information.  ?Confirmed understanding of upcoming appts and [place] location, explained arrival and registration process. ?I explained the purpose of a dental evaluation prior to starting RT, indicated he would be contacted by [facility] to arrange an appt.   ?I encouraged him to call with questions/concerns as he moves forward with appts and procedures.   ?He verbalized understanding of information provided, expressed appreciation for my call. ? ? ?Navigator Initial Assessment ?Employment Status: he is working ?Currently on FMLA / STD: yes, for recent hip surgery.  ?Living Situation: he lives alone ?Support System: none ?PCP: none ?PCD: he has had a dentist in the past but has not seen her for awhile.  ?Financial Concerns: yes ?Transportation Needs: no ?Sensory Deficits:no ?Language Barriers/Interpreter Needed:  no ?Ambulation Needs: no ?DME Used in Home: no ?Psychosocial Needs:  no ?Concerns/Needs Understanding Cancer:  addressed/answered by navigator to best of ability ?Self-Expressed Needs: no ? ? ?Harlow Asa RN, BSN, OCN ?Head & Neck Oncology Nurse Navigator ?[place] at [facility] ?Phone # [phone]  ?Fax # [phone]  ?  ?

## 2021-08-25 ENCOUNTER — Telehealth: Payer: Self-pay | Admitting: Hematology and Oncology

## 2021-08-25 ENCOUNTER — Other Ambulatory Visit: Payer: Self-pay

## 2021-08-25 DIAGNOSIS — J358 Other chronic diseases of tonsils and adenoids: Secondary | ICD-10-CM

## 2021-08-25 NOTE — Telephone Encounter (Signed)
Scheduled appt per 4/10 referral. Pt is aware of appt date and time. Pt is aware to arrive 15 mins prior to appt time and to bring and updated insurance card. Pt is aware of appt location.   ?

## 2021-08-26 NOTE — Progress Notes (Signed)
Oncology Nurse Navigator Documentation  ? ?Referral placed to Dr. Frederik Schmidt DDS for an appointment as soon as possible for pre-radiation assessment and possible Scatter Protective devices.  ?Request sent for referral and to notify me of appointment date/time. Confirmation of successful fax transmission received.  ? ?Harlow Asa RN, BSN, OCN ?Head & Neck Oncology Nurse Navigator ?[place] at [facility] ?Phone # [phone]  ?Fax # [phone]  ?

## 2021-08-26 NOTE — Progress Notes (Signed)
Head and Neck Cancer Location of Tumor / Histology:  ?Squamous cell carcinoma of the oropharynx, p16 (-) p40 (+) and EBV (-) ? ?Patient presented with symptoms of: presented to [facility] on 08/19/2021 due to "severe pain on the posterior roof of his mouth and began coughing up gray chunks of tissue that he notes feels like it is coming from the roof of his mouth." ? ?Biopsies revealed:  ?08/19/2021 ?FINAL MICROSCOPIC DIAGNOSIS:  ?A. TONSILLAR FOSSA, RIGHT:  ?Invasive poorly differentiated squamous cell carcinoma  ?B. SOFT PALATE, BIOPSY:  ?Invasive poorly differentiated squamous cell carcinoma (see comment)  ?COMMENT:  ?- Both specimens show identical tumors.   ?- Three immunohistochemical stains are performed on the soft palate biopsies with adequate control.  The tumor is diffusely and strongly positive for the squamous marker p40.  ?- The tumor is negative for the HPV surrogate marker p16.  ?- The tumor is negative for Epstein bar virus (EBV) by EBER in situ hybridization. ? ?Nutrition Status Yes No Comments  ?Weight changes? '[]'$  '[x]'$    ?Swallowing concerns? '[x]'$  '[]'$  Struggles with crunchy or drier food. Diet has consisted of softer/moister foods (like mashed potatoes)   ?PEG? '[]'$  '[x]'$    ? ?Referrals Yes No Comments  ?Social Work? '[x]'$  '[]'$    ?Dentistry? '[x]'$  '[]'$    ?Swallowing therapy? '[x]'$  '[]'$    ?Nutrition? '[x]'$  '[]'$    ?Med/Onc? '[x]'$  '[]'$    ? ?Safety Issues Yes No Comments  ?Prior radiation? '[]'$  '[x]'$    ?Pacemaker/ICD? '[]'$  '[x]'$    ?Possible current pregnancy? '[]'$  '[x]'$  N/A  ?Is the patient on methotrexate? '[]'$  '[x]'$    ? ?Tobacco/Marijuana/Snuff/ETOH use: Patient is a former cigarette smoker (1.5 pack/day; quit 09/2019) Current everyday marijuana user (smokes and edibles). Denies any current alcohol consumption  ? ?Past/Anticipated interventions by otolaryngology, if any:  ?08/19/2021 ?--Dr. Elba Barman ?PANENDOSCOPY WITH BIPOSY--direct laryngoscopy, flexible bronchoscopy, rigid esophagoscopy, biopsy of lesion in throat  ? ?Past/Anticipated  interventions by medical oncology, if any:  ?Scheduled for consultation with Dr. Arletha Pili Iruku 09/05/2021 ? ? ?Current Complaints / other details:  Reports right sided ear and jaw pain, as well as discomfort opening right side of jaw. Has significant trouble falling and staying asleep (uses Trazodone and marijuana to help); Had cervical fusion in 04/2018 by Dr. Atilano Ina ([facility] in [place], [place])  ? ? ? ?

## 2021-08-26 NOTE — Progress Notes (Signed)
?Radiation Oncology         [phone] ?________________________________ ? ?Initial Outpatient Consultation ? ?Name: Johnny Mata MRN: [id number]  ?Date: 08/27/2021  DOB: 1976-03-03 ? ?ON:[id number], No Pcp Per (Inactive)  Marcina Millard, MD  ? ?REFERRING PHYSICIAN: Marcina Millard, MD ? ?DIAGNOSIS:  ?  ICD-10-CM   ?1. Squamous cell carcinoma of overlapping sites of oropharynx (HCC)  C10.8   ?  ?2. 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  ?3. Primary cancer of tonsillar fossa (HCC)  C09.0 DISCONTINUED: oxyCODONE-acetaminophen (PERCOCET) 5-325 MG tablet  ?  ? ? Cancer Staging  ?Primary cancer of tonsillar fossa ([place]) ?Staging form: Pharynx - P16 Negative Oropharynx, AJCC 8th Edition ?- Clinical: cT3, cN2b, p16- - Unsigned ?Stage prefix: Initial diagnosis ? ? ?Squamous cell carcinoma of the oropharynx, p16 (-) p40 (+) and EBV (-) ? ?CHIEF COMPLAINT: Here to discuss management of oropharyngeal cancer ? ?HISTORY OF PRESENT ILLNESS::Johnny Mata is a 46 y.o. male who presented to the [facility] ED on 08/19/21 with severe pain on posterior roof of his mouth. He also endorsed coughing up grey chunks of tissue.  He reports 1-2 yrs of sinus issues (epistaxis).  ? ?Soft tissue neck CT performed for further evaluation on that same date showed a mucosal hyperenhancement in the right nasopharynx extending to the oropharynx, with masslike soft tissue density in the region of the right palatine tonsil, suspicious for malignancy. CT also showed a 1.8 cm x 1.5 cm x 2.4 cm peripherally enhancing hypodense lesion in the lateral right retropharynx suspicious for a necrotic node, as well as additional prominent right level II lymph nodes measuring up to 1.3 cm in short axis with suspected extracapsular extension at level IIB. ? ?Subsequently, the patient was admitted and underwent biopsies of the tonsillar fossa and soft palate on 08/19/21 which revealed invasive poorly  differentiated squamous cell carcinoma; p40 strongly positive; p16 negative.  ? ?Pertinent imaging thus far includes a Chest CT on performed on 08/20/21 which showed no definitive signs of metastatic disease to the chest. Some tiny nonspecific pulmonary nodules were however appreciated, as well as a nonspecific 7 mm hypervascular focus at the periphery of the left hemiliver (noted to possibly represent a small flash fill lesion, shunt lesion, or potentially transient in the setting of suspected head neck neoplasm).  ? ? ?Nutrition Status Yes No Comments  ?Weight changes? '[x]'$  '[]'$   Less than 10 lb  ?Swallowing concerns? '[x]'$  '[]'$  Struggles with crunchy or drier food. Diet has consisted of softer/moister foods (like mashed potatoes)   ?PEG? '[]'$  '[x]'$    ? ?Referrals Yes No Comments  ?Social Work? '[x]'$  '[]'$    ?Dentistry? '[x]'$  '[]'$    ?Swallowing therapy? '[x]'$  '[]'$    ?Nutrition? '[x]'$  '[]'$    ?Med/Onc? '[x]'$  '[]'$    ? ?Safety Issues Yes No Comments  ?Prior radiation? '[]'$  '[x]'$    ?Pacemaker/ICD? '[]'$  '[x]'$    ?Possible current pregnancy? '[]'$  '[x]'$  N/A  ?Is the patient on methotrexate? '[]'$  '[x]'$    ? ?Tobacco/Marijuana/Snuff/ETOH use: Patient is a former cigarette smoker (1.5 pack/day; quit 09/2019) Current everyday marijuana user (smokes and edibles). Denies any current alcohol consumption  ? ?Past/Anticipated interventions by otolaryngology, if any:  ?08/19/2021 ?--Dr. Elba Barman ?PANENDOSCOPY WITH BIPOSY--direct laryngoscopy, flexible bronchoscopy, rigid esophagoscopy, biopsy of lesion in throat  ? ?Past/Anticipated interventions by medical oncology, if any:  ?Scheduled for consultation with Dr. Arletha Pili Iruku 09/05/2021 ? ?PET pending ? ?Current Complaints / other details:  Reports right sided ear and jaw pain, as well as discomfort opening right side of jaw. Has significant trouble falling and staying asleep (uses Trazodone and marijuana to help); Had cervical fusion in 04/2018 by Dr. Atilano Ina ([facility] in [place], [place])   ?Pain to roof of his mouth, posterior right head/neck ? ?Other symptoms: reports coughing up tissue ? ?Tobacco history, if any: Quit smoking - His smoking use included cigarettes. He has a 30.00 pack-year smoking history. Transitioning from smoking marijuana to edibles ? ?ETOH abuse: reports quitting ? ?Prior cancers, if any: none ? ?Working in Product/process development scientist.  Lives alone. ? ?PREVIOUS RADIATION THERAPY: No ? ?PAST MEDICAL HISTORY:  has a past medical history of Anxiety, Asthma, and Depression.   ? ?PAST SURGICAL HISTORY: ?Past Surgical History:  ?Procedure Laterality Date  ? ELBOW SURGERY Bilateral   ? HIP SURGERY Left   ? NECK SURGERY  05/09/2018  ? Cervical fusion (Dr. Atilano Ina)  ? PANENDOSCOPY N/A 08/19/2021  ? Procedure: PANENDOSCOPY WITH BIPOSY--direct laryngoscopy, flexible bronchoscopy, rigid esophagoscopy, biopsy of lesion in throat;  Surgeon: Marcina Millard, MD;  Location: [facility];  Service: ENT;  Laterality: N/A;  ? PLANTAR FASCIA SURGERY    ? SHOULDER SURGERY Left   ? TONSILLECTOMY    ? [place] EXTRACTION Bilateral 1995  ? ? ?FAMILY HISTORY: family history includes Colon cancer in his mother. ? ?SOCIAL HISTORY:  reports that he quit smoking about 22 months ago. His smoking use included cigarettes. He has a 30.00 pack-year smoking history. He has never used smokeless tobacco. He reports that he does not currently use alcohol after a past usage of about 7.0 standard drinks per week. He reports current drug use. Drug: Marijuana. ? ?ALLERGIES: Dust mite extract, Other, and Pollen extract-tree extract [pollen extract] ? ?MEDICATIONS:  ?Current Outpatient Medications  ?Medication Sig Dispense Refill  ? sertraline (ZOLOFT) 100 MG tablet Take 100 mg by mouth daily.    ? traZODone (DESYREL) 50 MG tablet Take 50 mg by mouth at bedtime. Able to take 50-'200mg'$  as needed for sleep    ? diphenhydrAMINE (BENADRYL) 25 mg capsule Take 1 capsule (25 mg total) by mouth daily as needed for sleep. 30  capsule 0  ? ibuprofen (ADVIL) 200 MG tablet Take 400 mg by mouth See admin instructions. Pt states they are taking 400 mg every 2-3 hours as needed.    ? Ibuprofen-diphenhydrAMINE Cit (IBUPROFEN PM PO) Take 2 tablets by mouth at bedtime.    ? oxyCODONE-acetaminophen (PERCOCET) 5-325 MG tablet Take 1 tablet by mouth every 4 (four) hours as needed for severe pain. Take with food. 50 tablet 0  ? ?No current facility-administered medications for this encounter.  ? ? ?REVIEW OF SYSTEMS:  Notable for that above. ?  ?PHYSICAL EXAM:  height is 6' (1.829 m) and weight is 169 lb 3.2 oz (76.7 kg). His temperature is 97.5 ?F (36.4 ?C) (abnormal). His blood pressure is 107/77 and his pulse is 78. His respiration is 20 and oxygen saturation is 98%.   ?General: Alert and oriented, in no acute distress ?HEENT: Head is normocephalic. Extraocular movements are intact. Oropharynx is notable for brisk gag reflex, cannot examine throat. Dentition present. No thrush. ?Neck: Neck is notable for fullness in level II right neck but no obvious masses ?Heart: Regular in rate and rhythm with no murmurs, rubs, or gallops. ?Chest: Clear to auscultation bilaterally, with no rhonchi, wheezes, or rales. ?Abdomen: Soft, nontender, nondistended, with no  rigidity or guarding. ?Extremities: No cyanosis or edema. ?Lymphatics: see Neck Exam ?Skin: No concerning lesions. ?Musculoskeletal: symmetric strength and muscle tone throughout. ?Neurologic: Cranial nerves II through XII are grossly intact. No obvious focalities. Speech is fluent. Coordination is intact. ?Psychiatric: Judgment and insight are intact. Affect is appropriate. ? ? ?ECOG = 1 ? ?0 - Asymptomatic (Fully active, able to carry on all predisease activities without restriction) ? ?1 - Symptomatic but completely ambulatory (Restricted in physically strenuous activity but ambulatory and able to carry out work of a light or sedentary nature. For example, light housework, office work) ? ?2 -  Symptomatic, <50% in bed during the day (Ambulatory and capable of all self care but unable to carry out any work activities. Up and about more than 50% of waking hours) ? ?3 - Symptomatic, >50% in bed, but not bed

## 2021-08-27 ENCOUNTER — Other Ambulatory Visit: Payer: Self-pay | Admitting: Radiation Oncology

## 2021-08-27 ENCOUNTER — Encounter: Payer: Self-pay | Admitting: Radiation Oncology

## 2021-08-27 ENCOUNTER — Other Ambulatory Visit: Payer: Self-pay

## 2021-08-27 ENCOUNTER — Ambulatory Visit
Admission: RE | Admit: 2021-08-27 | Discharge: 2021-08-27 | Disposition: A | Discharge status: Home / Self Care | Payer: Commercial Managed Care - PPO | Source: Ambulatory Visit | Attending: Radiation Oncology | Admitting: Radiation Oncology

## 2021-08-27 VITALS — BP 107/77 | HR 78 | Temp 97.5°F | Resp 20 | Ht 72.0 in | Wt 169.2 lb

## 2021-08-27 DIAGNOSIS — C090 Malignant neoplasm of tonsillar fossa: Secondary | ICD-10-CM

## 2021-08-27 DIAGNOSIS — C108 Malignant neoplasm of overlapping sites of oropharynx: Secondary | ICD-10-CM | POA: Insufficient documentation

## 2021-08-27 DIAGNOSIS — Z87891 Personal history of nicotine dependence: Secondary | ICD-10-CM | POA: Insufficient documentation

## 2021-08-27 DIAGNOSIS — Z79899 Other long term (current) drug therapy: Secondary | ICD-10-CM | POA: Insufficient documentation

## 2021-08-27 DIAGNOSIS — C113 Malignant neoplasm of anterior wall of nasopharynx: Secondary | ICD-10-CM

## 2021-08-27 DIAGNOSIS — R918 Other nonspecific abnormal finding of lung field: Secondary | ICD-10-CM | POA: Insufficient documentation

## 2021-08-27 MED ORDER — OXYCODONE-ACETAMINOPHEN 5-325 MG PO TABS: 1.0000 | ORAL_TABLET | ORAL | 0 refills | Status: DC | PRN

## 2021-08-27 NOTE — Progress Notes (Signed)
Oncology Nurse Navigator Documentation  ? ?Met with patient during initial consult with Dr. Isidore Moos.  ?Further introduced myself as his/their Navigator, explained my role as a member of the Care Team. ?Provided New Patient Information packet: ?Contact information for physician, this navigator, other members of the Care Team ?Advance Directive information ([place] blue pamphlet with LCSW insert); provided [facility] AD booklet at his request,  ?Fall Prevention Patient Safety Plan ?Financial Assistance Information sheet ?Symptom Management Clinic information ?[place] with highlight of [place] ?SLP Information sheet ?Provided and discussed educational handouts for PEG and PAC. ?Assisted with post-consult appt scheduling. ?He is aware that he will receive a call to schedule his PET and to be seen by Dr. Rozell Searing for dental evaluation.  ?He verbalized understanding of information provided and I encouraged them to call with questions/concerns moving forward. ? ?Harlow Asa, RN, BSN, OCN ?Head & Neck Oncology Nurse Navigator ?[place] [facility] ?[phone]  ?

## 2021-08-27 NOTE — Progress Notes (Signed)
Oncology Nurse Navigator Documentation  ? ?Mr. Samad provided me with paperwork from his employer for continuous FMLA. I brought the paperwork along with the email cover letter with instructions to the radiation administrative area to be picked up by our STD and FMLA staff to complete.  ? ?Harlow Asa RN, BSN, OCN ?Head & Neck Oncology Nurse Navigator ?[place] [facility] ?Phone # [phone]  ?Fax # [phone]  ?

## 2021-08-28 ENCOUNTER — Telehealth: Payer: Self-pay | Admitting: *Deleted

## 2021-08-28 NOTE — Telephone Encounter (Signed)
Called patient to inform of Pet Scan for 09-01-21- arrival time- 1:30 pm @ [facility], patient to have water only- 6 hrs. prior to test, spoke with patient and he is aware of this test ?

## 2021-09-01 ENCOUNTER — Other Ambulatory Visit: Payer: Self-pay

## 2021-09-01 ENCOUNTER — Telehealth (INDEPENDENT_AMBULATORY_CARE_PROVIDER_SITE_OTHER): Payer: Self-pay | Admitting: Otolaryngology

## 2021-09-01 ENCOUNTER — Ambulatory Visit (HOSPITAL_COMMUNITY)
Admission: RE | Admit: 2021-09-01 | Discharge: 2021-09-01 | Disposition: A | Discharge status: Home / Self Care | Payer: Commercial Managed Care - PPO | Source: Ambulatory Visit | Attending: Radiation Oncology | Admitting: Radiation Oncology

## 2021-09-01 DIAGNOSIS — R5381 Other malaise: Secondary | ICD-10-CM

## 2021-09-01 DIAGNOSIS — C113 Malignant neoplasm of anterior wall of nasopharynx: Secondary | ICD-10-CM

## 2021-09-01 DIAGNOSIS — J358 Other chronic diseases of tonsils and adenoids: Secondary | ICD-10-CM | POA: Insufficient documentation

## 2021-09-01 LAB — GLUCOSE, CAPILLARY: Glucose-Capillary: 113 mg/dL — ABNORMAL HIGH (ref 70–99)

## 2021-09-01 MED ORDER — FLUDEOXYGLUCOSE F - 18 (FDG) INJECTION
8.5000 | Freq: Once | INTRAVENOUS | Status: AC | PRN
  Administered 2021-09-01: 8.39 via INTRAVENOUS

## 2021-09-01 NOTE — Telephone Encounter (Signed)
Discussed care plan with patient.  Reassurance provided. ?

## 2021-09-03 NOTE — Progress Notes (Signed)
I was contacted by IT.  They requested I dictate a discharge summary on this patient.  I reviewed the chart.  The discharge summary has clearly been dictated on 4/5.  I pointed that out to them in my return note. ?

## 2021-09-05 ENCOUNTER — Other Ambulatory Visit: Payer: Self-pay | Admitting: Hematology and Oncology

## 2021-09-05 ENCOUNTER — Inpatient Hospital Stay: Payer: Commercial Managed Care - PPO | Attending: Hematology and Oncology | Admitting: Hematology and Oncology

## 2021-09-05 ENCOUNTER — Encounter: Payer: Self-pay | Admitting: Hematology and Oncology

## 2021-09-05 ENCOUNTER — Inpatient Hospital Stay: Payer: Commercial Managed Care - PPO

## 2021-09-05 ENCOUNTER — Other Ambulatory Visit: Payer: Self-pay

## 2021-09-05 VITALS — BP 119/79 | HR 72 | Temp 97.2°F | Resp 18 | Wt 167.4 lb

## 2021-09-05 DIAGNOSIS — Z87891 Personal history of nicotine dependence: Secondary | ICD-10-CM | POA: Insufficient documentation

## 2021-09-05 DIAGNOSIS — Z800 Family history of malignant neoplasm of digestive organs: Secondary | ICD-10-CM | POA: Insufficient documentation

## 2021-09-05 DIAGNOSIS — C090 Malignant neoplasm of tonsillar fossa: Secondary | ICD-10-CM | POA: Diagnosis present

## 2021-09-05 MED ORDER — OXYCODONE-ACETAMINOPHEN 5-325 MG PO TABS: 1.0000 | ORAL_TABLET | ORAL | 0 refills | Status: DC | PRN

## 2021-09-05 NOTE — Progress Notes (Signed)
Oncology Nurse Navigator Documentation  ? ?Met with patient during initial consult with Dr. Chryl Heck. ?Further introduced myself as his Navigator, explained my role as a member of the Care Team. ?He verbalized understanding of information provided. ?I encouraged him to call with questions/concerns moving forward. ? ?Harlow Asa, RN, BSN, OCN ?Head & Neck Oncology Nurse Navigator ?[place] [facility] [place] ?[phone]  ?

## 2021-09-05 NOTE — Assessment & Plan Note (Signed)
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  He arrived to the appointment today by himself.  He is quite healthy at baseline except for some psychiatric issues/history of depression and anxiety.  He also mentions heavy history of smoking, almost smoked 3 packs/day when he quit.  He started smoking at the age of 11 and smoked for almost 20 years.  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 ?He reports some sore throat, difficulty swallowing and radiating pain to his right ear.  He otherwise denies any new complaints.  Physical examination noted palpable right cervical lymphadenopathy and right posterior oropharyngeal tumor.  PET/CT reviewed.  No evidence of distant metastatic disease.  Given locally advanced cancer I agree that Johnny Mata will be a good candidate for concurrent chemotherapy and radiation.  We have discussed about weekly cisplatin, adverse effects of cisplatin including but not limited to fatigue, nausea, vomiting, increased risk of infections, ototoxicity, nephrotoxicity and neuropathy.  He understands that some of the side effects can be life-threatening and fatal.  He is agreeable to port NG tube placement, this has been ordered.  We anticipate starting chemotherapy along with radiation.  He is willing to proceed with treatment as planned.  Chemo teach needs to be scheduled. ?

## 2021-09-05 NOTE — Progress Notes (Signed)
START ON PATHWAY REGIMEN - Head and Neck ? ? ?  A cycle is every 7 days: ?    Cisplatin  ? ?**Always confirm dose/schedule in your pharmacy ordering system** ? ?Patient Characteristics: ?Oropharynx, HPV Negative/Unknown, Preoperative or Nonsurgical Candidate (Clinical Staging), Stage IVA/B ?Disease Classification: Oropharynx ?HPV Status: Negative (-) ?Therapeutic Status: Preoperative or Nonsurgical Candidate (Clinical Staging) ?AJCC T Category: cT3 ?AJCC 8 Stage Grouping: IVA ?AJCC N Category: cN2b ?AJCC M Category: cM0 ?Intent of Therapy: ?Curative Intent, Discussed with Patient ?

## 2021-09-05 NOTE — Progress Notes (Signed)
[name] ? ?Patient Care Team: ?Patient, No Pcp Per (Inactive) as PCP - General (General Practice) ?Atilano Ina, MD as Referring Physician (Neurosurgery) ? ?CHIEF COMPLAINTS/PURPOSE OF CONSULTATION:  ?SCC oropharynx ? 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  He arrived to the appointment today by himself.  He is quite healthy at baseline except for some psychiatric issues/history of depression and anxiety.  He also mentions heavy history of smoking, almost smoked 3 packs/day when he quit.  He started smoking at the age of 61 and smoked for almost 20 years.  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 ?He reports some sore throat, difficulty swallowing and radiating pain to his right ear.  He otherwise denies any new complaints.  Physical examination noted palpable right cervical lymphadenopathy and right posterior oropharyngeal tumor.  PET/CT reviewed.  No evidence of distant metastatic disease.  Given locally advanced cancer I agree that Johnny Mata will be a good candidate for concurrent chemotherapy and radiation.  We have discussed about weekly cisplatin, adverse effects of cisplatin including but not limited to fatigue, nausea, vomiting, increased risk of infections, ototoxicity, nephrotoxicity and neuropathy.  He understands that some of the side effects can be life-threatening and fatal.  He is agreeable to port NG tube placement, this has been ordered.  We anticipate starting chemotherapy along with radiation.  He is willing to proceed with treatment as planned.  Chemo teach needs to be scheduled. ? ?Orders Placed This Encounter  ?Procedures  ? IR IMAGING GUIDED PORT INSERTION  ?  Standing Status:   Future  ?  Standing Expiration Date:   09/06/2022  ?   Order Specific Question:   Reason for Exam (SYMPTOM  OR DIAGNOSIS REQUIRED)  ?  Answer:   SCC tonsil  ?  Order Specific Question:   Preferred Imaging Location?  ?  Answer:   [facility]  ? IR Gastrostomy Tube  ?  Standing Status:   Future  ?  Standing Expiration Date:   09/06/2022  ?  Order Specific Question:   Reason for exam:  ?  Answer:   Chemoradiation for SCC tonsil  ?  Order Specific Question:   Preferred Imaging Location?  ?  Answer:   [facility]  ? ? ? ?HISTORY OF PRESENTING ILLNESS:  ?Johnny Mata 46 y.o. 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 ? ?Oncology History  ?Primary cancer of tonsillar fossa ([place])  ?08/19/2021 Imaging  ? Patient complained of spitting chunks of tissue hence had a CT maxillofacial with contrast as well as with the neck with contrast.  Mucosal hyperenhancement in the right nasopharynx extending to the oropharynx with mostly soft tissue density in the region of right palatine tonsil most suspicious for malignancy.  There is also a 1.8 x 1.5 x 2.4 cm peripherally enhancing hyperintense lesion in the right retropharynx suspicious for necrotic lymph node with additional prominent right level 2 lymph nodes measuring up to 1.3 cm in short axis with suspected extracapsular extension at level to me. ?  ?08/27/2021 Initial Diagnosis  ? Primary cancer of tonsillar fossa ([place]) ? ?  ?09/01/2021 PET scan  ? PET scan from April 17 showed locally advanced right palatine tonsil primary with ipsilateral cervical nodal metastasis no evidence of hypermetabolic extracervical metastasis.  Tiny pulmonary nodules below PET resolution consider chest CT follow-up at 6 months ?  ?09/03/2021 Cancer Staging  ? Staging form: Pharynx - P16 Negative Oropharynx, AJCC 8th Edition ?- Clinical stage from 09/03/2021: Stage IVA (cT3, cN2b, cM0, p16-) - Signed by Eppie Gibson, MD on 09/03/2021 ?Stage prefix: Initial diagnosis ? ?  ? ?He arrived to the appointment today by himself.  He complains of ongoing  sore throat, pain radiating to the right ear and some difficulty swallowing.  He ate soft mushy food, has not lost any weight.  He has underlying anxiety/depression and takes medication.  He follows up with a psychiatrist at the [place].  Rest of the pertinent 10 point ROS reviewed and negative ? ? ?MEDICAL HISTORY:  ?Past Medical History:  ?Diagnosis Date  ? Anxiety   ? Never officially diagnosed; does not take medication for anxiety  ? Asthma   ? Hasn't had an episode since childhood  ? Depression   ? ? ?SURGICAL HISTORY: ?Past Surgical History:  ?Procedure Laterality Date  ? ELBOW SURGERY Bilateral   ? HIP SURGERY Left   ? NECK SURGERY  05/09/2018  ? Cervical fusion (Dr. Atilano Ina)  ? PANENDOSCOPY N/A 08/19/2021  ? Procedure: PANENDOSCOPY WITH BIPOSY--direct laryngoscopy, flexible bronchoscopy, rigid esophagoscopy, biopsy of lesion in throat;  Surgeon: Marcina Millard, MD;  Location: [facility];  Service: ENT;  Laterality: N/A;  ? PLANTAR FASCIA SURGERY    ? SHOULDER SURGERY Left   ? TONSILLECTOMY    ? [place] EXTRACTION Bilateral 1995  ? ? ?SOCIAL HISTORY: ?Social History  ? ?Socioeconomic History  ? Marital status: Single  ?  Spouse name: Not on file  ? Number of children: Not on file  ? Years of education: Not on file  ? Highest education level: Not on file  ?Occupational History  ? Not on file  ?Tobacco Use  ? Smoking status: Former  ?  Packs/day: 1.50  ?  Years: 20.00  ?  Pack years: 30.00  ?  Types: Cigarettes  ?  Quit date: 10/02/2019  ?  Years since quitting: 1.9  ? Smokeless tobacco: Never  ?Vaping Use  ? Vaping Use: Never used  ?Substance and Sexual Activity  ? Alcohol use: Not Currently  ?  Alcohol/week: 7.0 standard drinks  ?  Types: 7 Shots of liquor per week  ? Drug use: Yes  ?  Types: Marijuana  ? Sexual activity: Not Currently  ?Other Topics Concern  ? Not on file  ?Social History Narrative  ? Not on file  ? ?Social Determinants of Health  ? ?Financial Resource Strain: Not on file  ?Food  Insecurity: Not on file  ?Transportation Needs: Not on file  ?Physical Activity: Not on file  ?Stress: Not on file  ?Social Connections: Not on file  ?Intimate Partner Violence: Not on file  ? ? ?FAMILY HISTORY: ?Family History  ?Problem Relation Age of Onset  ? Colon cancer Mother   ? ? ?ALLERGIES:  is allergic to dust mite extract, other, and pollen extract-tree extract [pollen extract]. ? ?MEDICATIONS:  ?Current Outpatient Medications  ?Medication Sig Dispense Refill  ? diphenhydrAMINE (BENADRYL) 25 mg capsule Take 1 capsule (25 mg total) by mouth daily as needed for sleep. 30 capsule 0  ? ibuprofen (ADVIL) 200 MG tablet Take 400 mg by mouth See admin instructions. Pt states they are taking 400 mg every 2-3 hours as needed.    ? Ibuprofen-diphenhydrAMINE Cit (IBUPROFEN PM PO) Take 2 tablets by  mouth at bedtime.    ? oxyCODONE-acetaminophen (PERCOCET) 5-325 MG tablet Take 1 tablet by mouth every 4 (four) hours as needed for severe pain. Take with food. 50 tablet 0  ? sertraline (ZOLOFT) 100 MG tablet Take 100 mg by mouth daily.    ? traZODone (DESYREL) 50 MG tablet Take 50 mg by mouth at bedtime. Able to take 50-'200mg'$  as needed for sleep    ? ?No current facility-administered medications for this visit.  ? ? ? ?PHYSICAL EXAMINATION: ?ECOG PERFORMANCE STATUS: 0 - Asymptomatic ? ?Vitals:  ? 09/05/21 1026  ?BP: 119/79  ?Pulse: 72  ?Resp: 18  ?Temp: (!) 97.2 ?F (36.2 ?C)  ?SpO2: 98%  ? ?Filed Weights  ? 09/05/21 1026  ?Weight: 167 lb 6 oz (75.9 kg)  ? ? ?GENERAL:alert, no distress and comfortable ?SKIN: skin color, texture, turgor are normal, no rashes or significant lesions ?EYES: normal, conjunctiva are pink and non-injected, sclera clear ?OROPHARYNX: Exudative mass noted on the right oropharyngeal region ?NECK: Palpable right cervical lymphadenopathy. ?LYMPH:  no palpable lymphadenopathy in the cervical, axillary or inguinal ?LUNGS: clear to auscultation and percussion with normal breathing effort ?HEART: regular  rate & rhythm and no murmurs and no lower extremity edema ?ABDOMEN:abdomen soft, non-tender and normal bowel sounds ?Musculoskeletal:no cyanosis of digits and no clubbing  ?PSYCH: alert & oriented x 3 with fluent

## 2021-09-08 ENCOUNTER — Ambulatory Visit
Admission: RE | Admit: 2021-09-08 | Discharge: 2021-09-08 | Disposition: A | Discharge status: Home / Self Care | Payer: Commercial Managed Care - PPO | Source: Ambulatory Visit | Attending: Radiation Oncology | Admitting: Radiation Oncology

## 2021-09-08 ENCOUNTER — Encounter: Payer: Self-pay | Admitting: *Deleted

## 2021-09-08 ENCOUNTER — Telehealth: Payer: Self-pay | Admitting: *Deleted

## 2021-09-08 ENCOUNTER — Other Ambulatory Visit: Payer: Self-pay

## 2021-09-08 VITALS — BP 118/74 | HR 70 | Temp 97.0°F | Resp 18 | Wt 168.0 lb

## 2021-09-08 DIAGNOSIS — C113 Malignant neoplasm of anterior wall of nasopharynx: Secondary | ICD-10-CM | POA: Diagnosis not present

## 2021-09-08 DIAGNOSIS — C090 Malignant neoplasm of tonsillar fossa: Secondary | ICD-10-CM | POA: Diagnosis not present

## 2021-09-08 DIAGNOSIS — C108 Malignant neoplasm of overlapping sites of oropharynx: Secondary | ICD-10-CM | POA: Insufficient documentation

## 2021-09-08 MED ORDER — SODIUM CHLORIDE 0.9% FLUSH
10.0000 mL | Freq: Once | INTRAVENOUS | Status: AC
  Administered 2021-09-08: 10 mL via INTRAVENOUS

## 2021-09-08 NOTE — Progress Notes (Signed)
Has armband been applied?  Yes.   ? ?Does patient have an allergy to IV contrast dye?: No. ?  ?Has patient ever received premedication for IV contrast dye?: No.  ? ?Does patient take metformin?: No. ? ?Date of lab work: August 19, 2021 ?BUN: 7 ?CR: 0.86 ?eGFR: >60 ? ?IV site: antecubital right, condition patent and no redness ? ?Has IV site been added to flowsheet?  Yes.   ? ?BP 118/74 (BP Location: Left Arm, Patient Position: Sitting)   Pulse 70   Temp (!) 97 ?F (36.1 ?C) (Temporal)   Resp 18   Wt 168 lb (76.2 kg)   SpO2 98%   BMI 22.78 kg/m?  ? ?

## 2021-09-08 NOTE — Progress Notes (Signed)
Oncology Nurse Navigator Documentation  ? ?Met with Tanya Marvin to provide PEG/port education prior to 09/11/21 placement. ? ? ?Using  PEG teaching device   and Teach Back, provided education for PEG use and care, including: hand hygiene, gravity bolus administration of daily water flushes and nutritional supplement, fluids and medications; care of tube insertion site including daily dressing change and cleaning; S&S of infection.   ?Mr. Melling correctly verbalized procedures for and provided correct return demonstration of gravity administration of water, dressing change and site care.  ?I provided written instructions for PEG flushing/dressing change in support of verbal instruction.   ?I provided/described contents of Start of Care Bolus Feeding Kit (10 60 cc syringes, 1 box 4x4 drainage sponges, 1 package mesh briefs, 1 roll paper tape, 1 case Osmolite 1.5).  He voiced understanding he is to start using Osmolite per guidance of Nutrition. ?He understands I will be available for ongoing PEG support. ?Provided barium sulfate prep which I obtained from [facility], and reviewed instructions. ? ? ?Harlow Asa RN, BSN, OCN ?Head & Neck Oncology Nurse Navigator ?[place] [facility] ?Phone # [phone]  ?Fax # [phone]   ?

## 2021-09-08 NOTE — Telephone Encounter (Signed)
FMLA paperwork completed today placed in provider in-basket for provider review and signature.   ?

## 2021-09-09 ENCOUNTER — Other Ambulatory Visit: Payer: Self-pay | Admitting: Hematology and Oncology

## 2021-09-09 DIAGNOSIS — C090 Malignant neoplasm of tonsillar fossa: Secondary | ICD-10-CM

## 2021-09-09 MED ORDER — LIDOCAINE-PRILOCAINE 2.5-2.5 % EX CREA: TOPICAL_CREAM | CUTANEOUS | 3 refills | Status: DC

## 2021-09-09 MED ORDER — ONDANSETRON HCL 8 MG PO TABS: 8.0000 mg | ORAL_TABLET | Freq: Two times a day (BID) | ORAL | 1 refills | Status: DC | PRN

## 2021-09-09 MED ORDER — DEXAMETHASONE 4 MG PO TABS: 8.0000 mg | ORAL_TABLET | Freq: Every day | ORAL | 1 refills | Status: DC

## 2021-09-09 MED ORDER — PROCHLORPERAZINE MALEATE 10 MG PO TABS: 10.0000 mg | ORAL_TABLET | Freq: Four times a day (QID) | ORAL | 1 refills | Status: DC | PRN

## 2021-09-10 ENCOUNTER — Other Ambulatory Visit: Payer: Self-pay

## 2021-09-10 ENCOUNTER — Other Ambulatory Visit: Payer: Self-pay | Admitting: Hematology and Oncology

## 2021-09-10 ENCOUNTER — Ambulatory Visit: Payer: Commercial Managed Care - PPO

## 2021-09-10 ENCOUNTER — Telehealth: Payer: Self-pay | Admitting: *Deleted

## 2021-09-10 ENCOUNTER — Ambulatory Visit: Payer: Commercial Managed Care - PPO | Admitting: Radiation Oncology

## 2021-09-10 ENCOUNTER — Inpatient Hospital Stay: Payer: Commercial Managed Care - PPO

## 2021-09-10 ENCOUNTER — Other Ambulatory Visit: Payer: Self-pay | Admitting: Student

## 2021-09-10 ENCOUNTER — Ambulatory Visit (HOSPITAL_COMMUNITY): Payer: Commercial Managed Care - PPO

## 2021-09-10 DIAGNOSIS — C090 Malignant neoplasm of tonsillar fossa: Secondary | ICD-10-CM

## 2021-09-10 MED ORDER — HYDROCODONE-ACETAMINOPHEN 5-325 MG PO TABS: 1.0000 | ORAL_TABLET | Freq: Four times a day (QID) | ORAL | 0 refills | Status: DC | PRN

## 2021-09-10 MED ORDER — HYDROCODONE-ACETAMINOPHEN 10-325 MG PO TABS: 0.5000 | ORAL_TABLET | Freq: Four times a day (QID) | ORAL | 0 refills | Status: DC | PRN

## 2021-09-10 NOTE — Progress Notes (Signed)
Pharmacist Chemotherapy Monitoring - Initial Assessment   ? ?Anticipated start date: 09/17/21  ? ?The following has been reviewed per standard work regarding the patient's treatment regimen: ?The patient's diagnosis, treatment plan and drug doses, and organ/hematologic function ?Lab orders and baseline tests specific to treatment regimen  ?The treatment plan start date, drug sequencing, and pre-medications ?Prior authorization status  ?Patient's documented medication list, including drug-drug interaction screen and prescriptions for anti-emetics and supportive care specific to the treatment regimen ?The drug concentrations, fluid compatibility, administration routes, and timing of the medications to be used ?The patient's access for treatment and lifetime cumulative dose history, if applicable  ?The patient's medication allergies and previous infusion related reactions, if applicable  ? ?Changes made to treatment plan:  ?treatment plan date ? ?Follow up needed:  ?Pending authorization for treatment  ? ? ?Kennith Center, Pharm.D., CPP ?09/10/2021'@12'$ :29 PM ? ? ? ?

## 2021-09-10 NOTE — Telephone Encounter (Signed)
Per discussion with nurse educator- pt stating oxycodone causing side effects including nausea- sweating and general feeling of malaise interfering with managing pain due to the symptoms. ? ?Pt states he has used vicodin with good outcome in the past. ? ?Per MD request - pt brought in bottle of hydrocodone/apap written and dispensed on 09/05/2021 with 46 pills remaining from 60. ? ?Prescription sent to pharmacy per above for vicodin as well as this RN contacted the pharmacy to informed of above for appropriate filling of narcotics - and was informed that vicodin 5/'325mg'$  is on back order - available strength is 10/325. ? ?MD sent order per above. ?

## 2021-09-10 NOTE — Discharge Instructions (Signed)
Please call Interventional Radiology clinic [phone] with any questions or concerns. ? ?You may remove your dressing and shower tomorrow. ? ? ?

## 2021-09-10 NOTE — Progress Notes (Signed)
Pharmacy team called, they dont have hydrocodone 5/325 in stock, hence had to change the strength and prescription. ? ?Johnny Mata  ? ?

## 2021-09-10 NOTE — Progress Notes (Signed)
Patient prefers vycodin, he brought back oxycodone. He says oxycodone doesn't make him feel well. ?Vycodin prescription dispensed to the pharmacy of his choice. ?

## 2021-09-11 ENCOUNTER — Other Ambulatory Visit: Payer: Self-pay

## 2021-09-11 ENCOUNTER — Ambulatory Visit (HOSPITAL_COMMUNITY)
Admission: RE | Admit: 2021-09-11 | Discharge: 2021-09-11 | Disposition: A | Discharge status: Home / Self Care | Payer: Commercial Managed Care - PPO | Source: Ambulatory Visit | Attending: Hematology and Oncology | Admitting: Hematology and Oncology

## 2021-09-11 ENCOUNTER — Encounter (HOSPITAL_COMMUNITY): Payer: Self-pay

## 2021-09-11 DIAGNOSIS — C090 Malignant neoplasm of tonsillar fossa: Secondary | ICD-10-CM | POA: Diagnosis present

## 2021-09-11 DIAGNOSIS — F419 Anxiety disorder, unspecified: Secondary | ICD-10-CM | POA: Insufficient documentation

## 2021-09-11 DIAGNOSIS — F32A Depression, unspecified: Secondary | ICD-10-CM | POA: Diagnosis not present

## 2021-09-11 DIAGNOSIS — Z87891 Personal history of nicotine dependence: Secondary | ICD-10-CM | POA: Diagnosis not present

## 2021-09-11 HISTORY — PX: IR IMAGING GUIDED PORT INSERTION: IMG5740

## 2021-09-11 HISTORY — PX: IR GASTROSTOMY TUBE MOD SED: IMG625

## 2021-09-11 LAB — BASIC METABOLIC PANEL
Anion gap: 9 (ref 5–15)
BUN: 12 mg/dL (ref 6–20)
CO2: 24 mmol/L (ref 22–32)
Calcium: 9.4 mg/dL (ref 8.9–10.3)
Chloride: 102 mmol/L (ref 98–111)
Creatinine, Ser: 0.82 mg/dL (ref 0.61–1.24)
GFR, Estimated: >60 mL/min (ref >60)
Glucose, Bld: 120 mg/dL — ABNORMAL HIGH (ref 70–99)
Potassium: 4 mmol/L (ref 3.5–5.1)
Sodium: 135 mmol/L (ref 135–145)

## 2021-09-11 LAB — CBC
HCT: 44.5 % (ref 39.0–52.0)
Hemoglobin: 15.4 g/dL (ref 13.0–17.0)
MCH: 31.2 pg (ref 26.0–34.0)
MCHC: 34.6 g/dL (ref 30.0–36.0)
MCV: 90.1 fL (ref 80.0–100.0)
Platelets: 285 10*3/uL (ref 150–400)
RBC: 4.94 MIL/uL (ref 4.22–5.81)
RDW: 12.4 % (ref 11.5–15.5)
WBC: 7.8 10*3/uL (ref 4.0–10.5)
nRBC: 0 % (ref 0.0–0.2)

## 2021-09-11 LAB — PROTIME-INR
INR: 1 (ref 0.8–1.2)
Prothrombin Time: 12.7 seconds (ref 11.4–15.2)

## 2021-09-11 MED ORDER — LIDOCAINE HCL 1 % IJ SOLN
INTRAMUSCULAR | Status: AC
  Administered 2021-09-11: 20 mL
  Filled 2021-09-11: qty 20

## 2021-09-11 MED ORDER — MIDAZOLAM HCL 2 MG/2ML IJ SOLN
INTRAMUSCULAR | Status: AC | PRN
  Administered 2021-09-11 (×2): 1 mg via INTRAVENOUS
  Administered 2021-09-11: 2 mg via INTRAVENOUS
  Administered 2021-09-11: 1 mg via INTRAVENOUS

## 2021-09-11 MED ORDER — DIPHENHYDRAMINE HCL 50 MG/ML IJ SOLN
INTRAMUSCULAR | Status: AC
  Filled 2021-09-11: qty 1

## 2021-09-11 MED ORDER — HEPARIN SOD (PORK) LOCK FLUSH 100 UNIT/ML IV SOLN
INTRAVENOUS | Status: AC
  Administered 2021-09-11: 5 [IU]
  Filled 2021-09-11: qty 5

## 2021-09-11 MED ORDER — KETOROLAC TROMETHAMINE 30 MG/ML IJ SOLN
INTRAMUSCULAR | Status: AC | PRN
  Administered 2021-09-11: 30 mg via INTRAVENOUS

## 2021-09-11 MED ORDER — KETOROLAC TROMETHAMINE 30 MG/ML IJ SOLN
INTRAMUSCULAR | Status: AC
  Filled 2021-09-11: qty 1

## 2021-09-11 MED ORDER — DIPHENHYDRAMINE HCL 50 MG/ML IJ SOLN
INTRAMUSCULAR | Status: AC | PRN
  Administered 2021-09-11: 25 mg via INTRAVENOUS

## 2021-09-11 MED ORDER — FENTANYL CITRATE (PF) 100 MCG/2ML IJ SOLN
INTRAMUSCULAR | Status: AC | PRN
  Administered 2021-09-11 (×4): 50 ug via INTRAVENOUS

## 2021-09-11 MED ORDER — MIDAZOLAM HCL 2 MG/2ML IJ SOLN
INTRAMUSCULAR | Status: AC
  Filled 2021-09-11: qty 6

## 2021-09-11 MED ORDER — MORPHINE SULFATE (PF) 2 MG/ML IV SOLN
2.0000 mg | INTRAVENOUS | Status: AC
  Administered 2021-09-11: 2 mg via INTRAVENOUS
  Filled 2021-09-11: qty 1

## 2021-09-11 MED ORDER — SODIUM CHLORIDE 0.9 % IV SOLN: INTRAVENOUS | Status: DC

## 2021-09-11 MED ORDER — LIDOCAINE VISCOUS HCL 2 % MT SOLN
OROMUCOSAL | Status: AC
  Filled 2021-09-11: qty 15

## 2021-09-11 MED ORDER — HEPARIN SOD (PORK) LOCK FLUSH 100 UNIT/ML IV SOLN
INTRAVENOUS | Status: AC | PRN
  Administered 2021-09-11: 500 [IU] via INTRAVENOUS

## 2021-09-11 MED ORDER — LIDOCAINE HCL 1 % IJ SOLN
INTRAMUSCULAR | Status: AC
  Administered 2021-09-11: 10 mL
  Filled 2021-09-11: qty 20

## 2021-09-11 MED ORDER — CEFAZOLIN SODIUM-DEXTROSE 2-4 GM/100ML-% IV SOLN: 2.0000 g | INTRAVENOUS | Status: DC

## 2021-09-11 MED ORDER — FENTANYL CITRATE (PF) 100 MCG/2ML IJ SOLN
INTRAMUSCULAR | Status: AC
  Filled 2021-09-11: qty 4

## 2021-09-11 MED ORDER — BACITRACIN-NEOMYCIN-POLYMYXIN 400-5-5000 EX OINT: 1.0000 "application " | TOPICAL_OINTMENT | Freq: Every day | CUTANEOUS | Status: DC

## 2021-09-11 MED ORDER — MORPHINE SULFATE (PF) 4 MG/ML IV SOLN: 2.0000 mg | Freq: Once | INTRAVENOUS | Status: DC

## 2021-09-11 MED ORDER — LIDOCAINE-EPINEPHRINE 1 %-1:100000 IJ SOLN
INTRAMUSCULAR | Status: AC | PRN
  Administered 2021-09-11: 20 mL

## 2021-09-11 MED ORDER — CEFAZOLIN SODIUM-DEXTROSE 2-4 GM/100ML-% IV SOLN
INTRAVENOUS | Status: AC
  Administered 2021-09-11: 2000 mg
  Filled 2021-09-11: qty 100

## 2021-09-11 MED ORDER — GLUCAGON HCL RDNA (DIAGNOSTIC) 1 MG IJ SOLR
INTRAMUSCULAR | Status: AC
  Filled 2021-09-11: qty 1

## 2021-09-11 MED ORDER — IOHEXOL 300 MG/ML  SOLN
50.0000 mL | Freq: Once | INTRAMUSCULAR | Status: AC | PRN
  Administered 2021-09-11: 10 mL

## 2021-09-11 NOTE — Procedures (Signed)
Interventional Radiology Procedure Note ? ?Procedure: Placement of percutaneous 81F pull-through gastrostomy tube. ?Complications: None ?Recommendations: ?- Small meals ok ?- Ice as needed ?-1 hr recovery ?- ok to take all home meds ?- home pain meds ok ? ?Signed, ?  ?Dulcy Fanny. Earleen Newport, DO ? ? ?

## 2021-09-11 NOTE — Telephone Encounter (Signed)
[place] paperwork successfully faxed to [phone] at 1514.  Original copy mailed to patient address on file. ?[address] ?[place] [address] ? ?No further completed form delivery actions performed or further instructions received. ? ?

## 2021-09-11 NOTE — Discharge Instructions (Signed)
Please call Interventional Radiology clinic [phone] with any questions or concerns. ? ?You may remove your dressing and shower tomorrow. ? ? ?

## 2021-09-11 NOTE — Consult Note (Signed)
? ?Chief Complaint: ?Patient was seen in consultation today for Port-A-Cath and gastrostomy tube placements ? ?Referring Physician(s): ?Iruku,Praveena ? ?Supervising Physician: Corrie Mckusick ? ?Patient Status: Johnny Mata ? ?History of Present Illness: ?Johnny Mata is a 46 y.o. male, ex-smoker, with history of anxiety/depression, asthma, and newly diagnosed squamous cell carcinoma of the tonsil who presents today for both Port-A-Cath and gastrostomy tube placements prior to chemoradiation. ? ?Past Medical History:  ?Diagnosis Date  ? Anxiety   ? Never officially diagnosed; does not take medication for anxiety  ? Asthma   ? Hasn't had an episode since childhood  ? Depression   ? ? ?Past Surgical History:  ?Procedure Laterality Date  ? ELBOW SURGERY Bilateral   ? HIP SURGERY Left   ? NECK SURGERY  05/09/2018  ? Cervical fusion (Dr. Atilano Ina)  ? PANENDOSCOPY N/A 08/19/2021  ? Procedure: PANENDOSCOPY WITH BIPOSY--direct laryngoscopy, flexible bronchoscopy, rigid esophagoscopy, biopsy of lesion in throat;  Surgeon: Marcina Millard, MD;  Location: [place];  Service: ENT;  Laterality: N/A;  ? PLANTAR FASCIA SURGERY    ? SHOULDER SURGERY Left   ? TONSILLECTOMY    ? [place] EXTRACTION Bilateral 1995  ? ? ?Allergies: ?Dust mite extract, Other, and Pollen extract-tree extract [pollen extract] ? ?Medications: ?Prior to Admission medications   ?Medication Sig Start Date End Date Taking? Authorizing Provider  ?dexamethasone (DECADRON) 4 MG tablet Take 2 tablets (8 mg total) by mouth daily. Take daily x 3 days starting the day after cisplatin chemotherapy. Take with food. 09/09/21   Benay Pike, MD  ?diphenhydrAMINE (BENADRYL) 25 mg capsule Take 1 capsule (25 mg total) by mouth daily as needed for sleep. 08/20/21   Marcina Millard, MD  ?HYDROcodone-acetaminophen [facility]) 10-325 MG tablet Take 0.5-1 tablets by mouth every 6 (six) hours as needed. 09/10/21   Benay Pike, MD  ?ibuprofen (ADVIL) 200 MG  tablet Take 400 mg by mouth See admin instructions. Pt states they are taking 400 mg every 2-3 hours as needed.    [provider]  ?Ibuprofen-diphenhydrAMINE Cit (IBUPROFEN PM PO) Take 2 tablets by mouth at bedtime.    [provider]  ?lidocaine-prilocaine (EMLA) cream Apply to affected area once 09/09/21   Benay Pike, MD  ?ondansetron (ZOFRAN) 8 MG tablet Take 1 tablet (8 mg total) by mouth 2 (two) times daily as needed. Start on the third day after cisplatin chemotherapy. 09/09/21   Benay Pike, MD  ?prochlorperazine (COMPAZINE) 10 MG tablet Take 1 tablet (10 mg total) by mouth every 6 (six) hours as needed (Nausea or vomiting). 09/09/21   Benay Pike, MD  ?sertraline (ZOLOFT) 100 MG tablet Take 100 mg by mouth daily. 05/20/21   [provider]  ?traZODone (DESYREL) 50 MG tablet Take 50 mg by mouth at bedtime. Able to take 50-'200mg'$  as needed for sleep    [provider]  ?  ? ?Family History  ?Problem Relation Age of Onset  ? Colon cancer Mother   ? ? ?Social History  ? ?Socioeconomic History  ? Marital status: Single  ?  Spouse name: Not on file  ? Number of children: Not on file  ? Years of education: Not on file  ? Highest education level: Not on file  ?Occupational History  ? Not on file  ?Tobacco Use  ? Smoking status: Former  ?  Packs/day: 1.50  ?  Years: 20.00  ?  Pack years: 30.00  ?  Types: Cigarettes  ?  Quit date: 10/02/2019  ?  Years since quitting: 1.9  ? Smokeless tobacco: Never  ?Vaping Use  ? Vaping Use: Never used  ?Substance and Sexual Activity  ? Alcohol use: Not Currently  ?  Alcohol/week: 7.0 standard drinks  ?  Types: 7 Shots of liquor per week  ? Drug use: Yes  ?  Types: Marijuana  ? Sexual activity: Not Currently  ?Other Topics Concern  ? Not on file  ?Social History Narrative  ? Not on file  ? ?Social Determinants of Health  ? ?Financial Resource Strain: Not on file  ?Food Insecurity: Not on file  ?Transportation Needs: Not on file  ?Physical  Activity: Not on file  ?Stress: Not on file  ?Social Connections: Not on file  ? ? ? ?Review of Systems currently denies fever, headache, chest pain, dyspnea, cough, abdominal/back pain, nausea, vomiting or bleeding.  He does have dysphagia and sore throat. ? ?Vital Signs: Blood pressure 144/98, temp 97.6, heart rate 77, respirations 18, O2 sat 99% room air ? ? ? ?Physical Exam awake, alert.  Chest clear to auscultation bilaterally.  Heart with regular rate and rhythm.  Abdomen soft, positive bowel sounds, nontender.  No lower extremity edema.  Right tonsillar mass, right cervical adenopathy ? ?Imaging: ?CT Soft Tissue Neck W Contrast ? ?Result Date: 08/19/2021 ?CLINICAL DATA:  Patient complains of spitting out his sinuses, spitting out great chunks of tissue EXAM: CT MAXILLOFACIAL WITH CONTRAST; CT NECK WITH CONTRAST TECHNIQUE: Multidetector CT imaging of the neck was performed using the standard protocol following the bolus administration of intravenous contrast. Multidetector CT imaging of the maxillofacial structures was performed with intravenous contrast. Multiplanar CT image reconstructions were also generated. RADIATION DOSE REDUCTION: This exam was performed according to the departmental dose-optimization program which includes automated exposure control, adjustment of the mA and/or kV according to patient size and/or use of iterative reconstruction technique. CONTRAST:  35m OMNIPAQUE IOHEXOL 350 MG/ML SOLN COMPARISON:  None. FINDINGS: MAXILLOFACIAL CT Osseous: The facial bones are unremarkable. There is no acute osseous abnormality or suspicious osseous lesion. Orbits: The globes and orbits are unremarkable. Sinuses: There is mild mucosal thickening in the paranasal sinuses. Soft tissues: The facial soft tissues are unremarkable. See below for findings regarding the airway. Limited intracranial: The imaged portions of the intracranial compartment are unremarkable. SOFT TISSUE NECK CT Pharynx and larynx: The  nasal cavity is unremarkable. There is a 1.8 cm x 1.5 cm x 2.4 cm peripherally enhancing, hypodense lesion in the lateral right retropharynx with mass effect on the surrounding structures. There is mucosal hyperenhancement in the right nasopharynx extending into the oropharynx and glossotonsillar sulcus. There is an irregular enhancing lesion in the region of the right palatine tonsil measuring approximately 2.5 cm TV x 1.9 cm AP (3-31). There is effacement of the right parapharyngeal fat. These findings result in partial effacement of the oropharyngeal airway. The left parapharyngeal fat is preserved. The tongue is normal in appearance. The vallecula is normal. The hypopharynx and larynx are normal. The vocal folds are normal in appearance. Salivary glands: The parotid and submandibular glands are unremarkable. Thyroid: Unremarkable. Lymph nodes: There is a 1.3 cm x 1.7 cm x 1.7 cm right level II node with indistinct margins raising suspicion for extracapsular extension (3-34). There is a 1.0 cm right level IIA node without definite extracapsular extension (3-38). There are no pathologically enlarged nodes on the left. Vascular: The major vasculature of the neck is unremarkable. Skeleton: Postsurgical changes reflecting C4 through  C6 ACDF are noted. The plate is backed out by approximately 3 mm from the anterior endplate of the C4 vertebral body. There is no suspicious osseous lesion. Upper chest: The imaged lung apices are clear. Other: None. IMPRESSION: 1. Mucosal hyperenhancement in the right nasopharynx extending to the oropharynx with masslike soft tissue density in the region of the right palatine tonsil is most suspicious for malignancy, with less likely differential including infection (tonsillopharyngitis). Recommend ENT consultation and direct visualization. 2. 1.8 cm x 1.5 cm x 2.4 cm peripherally enhancing hypodense lesion in the lateral right retropharynx is suspicious for a necrotic node with  additional prominent right level II lymph nodes measuring up to 1.3 cm in short axis with suspected extracapsular extension at level IIB. 3. Postsurgical changes reflecting C4 through C6 ACDF. The plate is backed

## 2021-09-11 NOTE — Procedures (Signed)
Interventional Radiology Procedure Note ? ?Procedure: Placement of a right IJ approach single lumen PowerPort.  Tip is positioned at the superior cavoatrial junction and catheter is ready for immediate use.  ?Complications: None ?Recommendations:  ?- Proceed with gastrostomy ?- Ok to shower tomorrow ?- Do not submerge for 7 days ?- Routine line care  ? ?Signed, ? ?Dulcy Fanny. Earleen Newport, DO ? ? ?

## 2021-09-12 ENCOUNTER — Encounter: Payer: Self-pay | Admitting: Licensed Clinical Social Worker

## 2021-09-12 NOTE — Progress Notes (Signed)
[place] Clinical Social Work  ?Initial Assessment ? ? ?DARIEL BETZER is a 46 y.o. year old male contacted by phone. Clinical Social Work was referred by nurse navigator for assessment of psychosocial needs.  ? ?SDOH (Social Determinants of Health) assessments performed: Yes ?SDOH Interventions   ? ?Flowsheet Row Most Recent Value  ?SDOH Interventions   ?Financial Strain Interventions Other (Comment)  [cancer foundations]  ?Transportation Interventions Intervention Not Indicated, Patient Resources (Friends/Family)  ? ?  ?  ?Distress Screen completed: No ?   ? View : No data to display.  ?  ?  ?  ? ? ? ? ?Family/Social Information:  ?Housing Arrangement: patient lives alone ?Family members/support persons in your life? Minimal support (self described "anti-social veteran"). Does have a friend that can help with rides ?Transportation concerns: no, has friend who can help if needed  ?Employment: Out on disability from [employer] since October (60% pay) due to hip surgery and now cancer. Income source: Long-Term Disability ?Financial concerns: Yes, due to illness and/or loss of work during treatment ?Type of concern: Utilities, Rent/ mortgage, and Transportation ?Food access concerns: no ?Services Currently in place:  some benefits through [place] (psychiatry), FMLA & disability through work ? ?Coping/ Adjustment to diagnosis: ?Patient understands treatment plan and what happens next? yes ?Concerns about diagnosis and/or treatment:  Affording bills while continuing to be out of work ?Patient reported stressors: Finances ?Current coping skills/ strengths: Capable of independent living  ? ? ? SUMMARY: ?Current SDOH Barriers:  ?Financial constraints related to being out of work on disability ? ?Clinical Social Work Clinical Goal(s):  ?explore Data processing manager options for unmet needs related to:  Financial Strain  ? ?Interventions: ?Discussed the importance of support during treatment ?Informed patient of the support team roles and  support services at [facility] ?Provided CSW contact information and encouraged patient to call with any questions or concerns ?Referred patient to [employer] gas fund program ?Provided information on other potential avenues of support ([facility], [facility], local resources) ? ? ?Follow Up Plan: CSW will see patient on 5/3 to sign up for [employer]. Patient will complete applications for other support ?Patient verbalizes understanding of plan: Yes ? ? ? ?Bryah [place] E Greysin Medlen, LCSW ?

## 2021-09-15 DIAGNOSIS — R634 Abnormal weight loss: Secondary | ICD-10-CM | POA: Diagnosis not present

## 2021-09-15 DIAGNOSIS — H9311 Tinnitus, right ear: Secondary | ICD-10-CM | POA: Diagnosis not present

## 2021-09-15 DIAGNOSIS — K1232 Oral mucositis (ulcerative) due to other drugs: Secondary | ICD-10-CM | POA: Diagnosis not present

## 2021-09-15 DIAGNOSIS — Z5111 Encounter for antineoplastic chemotherapy: Secondary | ICD-10-CM | POA: Diagnosis not present

## 2021-09-15 DIAGNOSIS — C113 Malignant neoplasm of anterior wall of nasopharynx: Secondary | ICD-10-CM | POA: Diagnosis present

## 2021-09-15 DIAGNOSIS — G893 Neoplasm related pain (acute) (chronic): Secondary | ICD-10-CM | POA: Diagnosis not present

## 2021-09-15 DIAGNOSIS — Z800 Family history of malignant neoplasm of digestive organs: Secondary | ICD-10-CM | POA: Diagnosis not present

## 2021-09-15 DIAGNOSIS — C090 Malignant neoplasm of tonsillar fossa: Secondary | ICD-10-CM | POA: Insufficient documentation

## 2021-09-15 DIAGNOSIS — Z87891 Personal history of nicotine dependence: Secondary | ICD-10-CM | POA: Diagnosis not present

## 2021-09-15 DIAGNOSIS — Z95828 Presence of other vascular implants and grafts: Secondary | ICD-10-CM | POA: Insufficient documentation

## 2021-09-16 ENCOUNTER — Inpatient Hospital Stay: Payer: Commercial Managed Care - PPO | Attending: Hematology and Oncology

## 2021-09-16 ENCOUNTER — Inpatient Hospital Stay: Payer: Commercial Managed Care - PPO | Admitting: Hematology and Oncology

## 2021-09-16 ENCOUNTER — Encounter: Payer: Self-pay | Admitting: Hematology and Oncology

## 2021-09-16 ENCOUNTER — Other Ambulatory Visit: Payer: Self-pay

## 2021-09-16 DIAGNOSIS — R5383 Other fatigue: Secondary | ICD-10-CM

## 2021-09-16 DIAGNOSIS — Z5111 Encounter for antineoplastic chemotherapy: Secondary | ICD-10-CM | POA: Diagnosis not present

## 2021-09-16 DIAGNOSIS — C113 Malignant neoplasm of anterior wall of nasopharynx: Secondary | ICD-10-CM | POA: Insufficient documentation

## 2021-09-16 DIAGNOSIS — Z95828 Presence of other vascular implants and grafts: Secondary | ICD-10-CM

## 2021-09-16 DIAGNOSIS — K1232 Oral mucositis (ulcerative) due to other drugs: Secondary | ICD-10-CM | POA: Insufficient documentation

## 2021-09-16 DIAGNOSIS — G893 Neoplasm related pain (acute) (chronic): Secondary | ICD-10-CM | POA: Diagnosis not present

## 2021-09-16 DIAGNOSIS — Z87891 Personal history of nicotine dependence: Secondary | ICD-10-CM | POA: Insufficient documentation

## 2021-09-16 DIAGNOSIS — Z800 Family history of malignant neoplasm of digestive organs: Secondary | ICD-10-CM | POA: Insufficient documentation

## 2021-09-16 DIAGNOSIS — C090 Malignant neoplasm of tonsillar fossa: Secondary | ICD-10-CM

## 2021-09-16 DIAGNOSIS — H9311 Tinnitus, right ear: Secondary | ICD-10-CM | POA: Insufficient documentation

## 2021-09-16 DIAGNOSIS — R634 Abnormal weight loss: Secondary | ICD-10-CM | POA: Insufficient documentation

## 2021-09-16 LAB — BASIC METABOLIC PANEL - CANCER CENTER ONLY
Anion gap: 6 (ref 5–15)
BUN: 7 mg/dL (ref 6–20)
CO2: 30 mmol/L (ref 22–32)
Calcium: 9.2 mg/dL (ref 8.9–10.3)
Chloride: 101 mmol/L (ref 98–111)
Creatinine: 0.75 mg/dL (ref 0.61–1.24)
GFR, Estimated: >60 mL/min (ref >60)
Glucose, Bld: 100 mg/dL — ABNORMAL HIGH (ref 70–99)
Potassium: 4.2 mmol/L (ref 3.5–5.1)
Sodium: 137 mmol/L (ref 135–145)

## 2021-09-16 LAB — CBC WITH DIFFERENTIAL (CANCER CENTER ONLY)
Abs Immature Granulocytes: 0.03 10*3/uL (ref 0.00–0.07)
Basophils Absolute: 0 10*3/uL (ref 0.0–0.1)
Basophils Relative: 1 %
Eosinophils Absolute: 0.2 10*3/uL (ref 0.0–0.5)
Eosinophils Relative: 3 %
HCT: 40 % (ref 39.0–52.0)
Hemoglobin: 13.9 g/dL (ref 13.0–17.0)
Immature Granulocytes: 0 %
Lymphocytes Relative: 24 %
Lymphs Abs: 1.7 10*3/uL (ref 0.7–4.0)
MCH: 30.8 pg (ref 26.0–34.0)
MCHC: 34.8 g/dL (ref 30.0–36.0)
MCV: 88.7 fL (ref 80.0–100.0)
Monocytes Absolute: 0.6 10*3/uL (ref 0.1–1.0)
Monocytes Relative: 9 %
Neutro Abs: 4.5 10*3/uL (ref 1.7–7.7)
Neutrophils Relative %: 63 %
Platelet Count: 274 10*3/uL (ref 150–400)
RBC: 4.51 MIL/uL (ref 4.22–5.81)
RDW: 12 % (ref 11.5–15.5)
WBC Count: 7 10*3/uL (ref 4.0–10.5)
nRBC: 0 % (ref 0.0–0.2)

## 2021-09-16 LAB — MAGNESIUM: Magnesium: 2.1 mg/dL (ref 1.7–2.4)

## 2021-09-16 MED ORDER — SODIUM CHLORIDE 0.9% FLUSH
10.0000 mL | Freq: Once | INTRAVENOUS | Status: AC
  Administered 2021-09-16: 10 mL

## 2021-09-16 MED ORDER — HEPARIN SOD (PORK) LOCK FLUSH 100 UNIT/ML IV SOLN
500.0000 [IU] | Freq: Once | INTRAVENOUS | Status: AC
  Administered 2021-09-16: 500 [IU]

## 2021-09-16 NOTE — Assessment & Plan Note (Signed)
He has been taking oxycodone half tablet every 3 hours for pain control.  Encouraged him to try 1 full tablet and try to extend the interval to every 4-6 hours if possible.  He definitely give it a try.  The pain is quite excruciating radiating to the right ear.  We will continue to monitor this. ?

## 2021-09-16 NOTE — Assessment & Plan Note (Addendum)
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  ?He is here before C1D1 of chemotherapy. ?Labs reviewed and ok to proceed with chemotherapy tomorrow. ?

## 2021-09-16 NOTE — Progress Notes (Signed)
[facility] ?CONSULT NOTE ? ?Patient Care Team: ?Patient, No Pcp Per (Inactive) as PCP - General (General Practice) ?Atilano Ina, MD as Referring Physician (Neurosurgery) ? ?CHIEF COMPLAINTS/PURPOSE OF CONSULTATION:  ?SCC oropharynx ? ?ASSESSMENT & PLAN:  ?No problem-specific Assessment & Plan notes found for this encounter. ? ? ?No orders of the defined types were placed in this encounter. ? ? ? ?HISTORY OF PRESENTING ILLNESS:  ?Johnny Mata 46 y.o. 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 ? ?Oncology History  ?Primary cancer of tonsillar fossa ([place])  ?08/19/2021 Imaging  ? Patient complained of spitting chunks of tissue hence had a CT maxillofacial with contrast as well as with the neck with contrast.  Mucosal hyperenhancement in the right nasopharynx extending to the oropharynx with mostly soft tissue density in the region of right palatine tonsil most suspicious for malignancy.  There is also a 1.8 x 1.5 x 2.4 cm peripherally enhancing hyperintense lesion in the right retropharynx suspicious for necrotic lymph node with additional prominent right level 2 lymph nodes measuring up to 1.3 cm in short axis with suspected extracapsular extension at level to me. ?  ?08/27/2021 Initial Diagnosis  ? Primary cancer of tonsillar fossa ([place]) ? ?  ?09/01/2021 PET scan  ? PET scan from April 17 showed locally advanced right palatine tonsil primary with ipsilateral cervical nodal metastasis no evidence of hypermetabolic extracervical metastasis.  Tiny pulmonary nodules below PET resolution consider chest CT follow-up at 6 months ?  ?09/03/2021 Cancer Staging  ? Staging form: Pharynx - P16 Negative Oropharynx, AJCC 8th Edition ?- Clinical stage from 09/03/2021: Stage IVA (cT3, cN2b, cM0, p16-) - Signed by Eppie Gibson, MD on 09/03/2021 ?Stage prefix: Initial diagnosis ? ?  ?09/17/2021 -  Chemotherapy  ? Patient is on Treatment Plan : HEAD/NECK Cisplatin q7d  ? ?  ?  ? ?He arrived to the appointment today by  himself.  He complains of ongoing sore throat, pain radiating to the right ear and some difficulty swallowing.  He ate soft mushy food, has not lost any weight.  He has underlying anxiety/depression and takes medication.  He follows up with a psychiatrist at the [facility].  Rest of the pertinent 10 point ROS reviewed and negative ? ? ?MEDICAL HISTORY:  ?Past Medical History:  ?Diagnosis Date  ? Anxiety   ? Never officially diagnosed; does not take medication for anxiety  ? Asthma   ? Hasn't had an episode since childhood  ? Depression   ? ? ?SURGICAL HISTORY: ?Past Surgical History:  ?Procedure Laterality Date  ? ELBOW SURGERY Bilateral   ? HIP SURGERY Left   ? IR GASTROSTOMY TUBE MOD SED  09/11/2021  ? IR IMAGING GUIDED PORT INSERTION  09/11/2021  ? NECK SURGERY  05/09/2018  ? Cervical fusion (Dr. Atilano Ina)  ? PANENDOSCOPY N/A 08/19/2021  ? Procedure: PANENDOSCOPY WITH BIPOSY--direct laryngoscopy, flexible bronchoscopy, rigid esophagoscopy, biopsy of lesion in throat;  Surgeon: Marcina Millard, MD;  Location: [facility];  Service: ENT;  Laterality: N/A;  ? PLANTAR FASCIA SURGERY    ? SHOULDER SURGERY Left   ? TONSILLECTOMY    ? [place] EXTRACTION Bilateral 1995  ? ? ?SOCIAL HISTORY: ?Social History  ? ?Socioeconomic History  ? Marital status: Single  ?  Spouse name: Not on file  ? Number of children: Not on file  ? Years of education: Not on file  ? Highest education level: Not on file  ?Occupational History  ? Not on file  ?  Tobacco Use  ? Smoking status: Former  ?  Packs/day: 1.50  ?  Years: 20.00  ?  Pack years: 30.00  ?  Types: Cigarettes  ?  Quit date: 10/02/2019  ?  Years since quitting: 1.9  ? Smokeless tobacco: Never  ?Vaping Use  ? Vaping Use: Never used  ?Substance and Sexual Activity  ? Alcohol use: Not Currently  ?  Alcohol/week: 7.0 standard drinks  ?  Types: 7 Shots of liquor per week  ? Drug use: Yes  ?  Types: Marijuana  ? Sexual activity: Not Currently  ?Other Topics Concern  ? Not on file   ?Social History Narrative  ? Not on file  ? ?Social Determinants of Health  ? ?Financial Resource Strain: Medium Risk  ? Difficulty of Paying Living Expenses: Somewhat hard  ?Food Insecurity: Not on file  ?Transportation Needs: No Transportation Needs  ? Lack of Transportation (Medical): No  ? Lack of Transportation (Non-Medical): No  ?Physical Activity: Not on file  ?Stress: Not on file  ?Social Connections: Not on file  ?Intimate Partner Violence: Not on file  ? ? ?FAMILY HISTORY: ?Family History  ?Problem Relation Age of Onset  ? Colon cancer Mother   ? ? ?ALLERGIES:  is allergic to dust mite extract, other, and pollen extract-tree extract [pollen extract]. ? ?MEDICATIONS:  ?Current Outpatient Medications  ?Medication Sig Dispense Refill  ? dexamethasone (DECADRON) 4 MG tablet Take 2 tablets (8 mg total) by mouth daily. Take daily x 3 days starting the day after cisplatin chemotherapy. Take with food. 30 tablet 1  ? diphenhydrAMINE (BENADRYL) 25 mg capsule Take 1 capsule (25 mg total) by mouth daily as needed for sleep. 30 capsule 0  ? HYDROcodone-acetaminophen (NORCO) 10-325 MG tablet Take 0.5-1 tablets by mouth every 6 (six) hours as needed. 45 tablet 0  ? ibuprofen (ADVIL) 200 MG tablet Take 400 mg by mouth See admin instructions. Pt states they are taking 400 mg every 2-3 hours as needed.    ? Ibuprofen-diphenhydrAMINE Cit (IBUPROFEN PM PO) Take 2 tablets by mouth at bedtime.    ? lidocaine-prilocaine (EMLA) cream Apply to affected area once 30 g 3  ? ondansetron (ZOFRAN) 8 MG tablet Take 1 tablet (8 mg total) by mouth 2 (two) times daily as needed. Start on the third day after cisplatin chemotherapy. 30 tablet 1  ? prochlorperazine (COMPAZINE) 10 MG tablet Take 1 tablet (10 mg total) by mouth every 6 (six) hours as needed (Nausea or vomiting). 30 tablet 1  ? sertraline (ZOLOFT) 100 MG tablet Take 100 mg by mouth daily.    ? traZODone (DESYREL) 50 MG tablet Take 50 mg by mouth at bedtime. Able to take  50-'200mg'$  as needed for sleep    ? ?No current facility-administered medications for this visit.  ? ? ? ?PHYSICAL EXAMINATION: ?ECOG PERFORMANCE STATUS: 0 - Asymptomatic ? ?Vitals:  ? 09/16/21 1618  ?BP: (!) 140/99  ?Pulse: 78  ?Resp: 18  ?Temp: 99 ?F (37.2 ?C)  ?SpO2: 97%  ? ?Filed Weights  ? 09/16/21 1618  ?Weight: 159 lb 1.6 oz (72.2 kg)  ? ? ?GENERAL:alert, no distress and comfortable ?SKIN: skin color, texture, turgor are normal, no rashes or significant lesions ?EYES: normal, conjunctiva are pink and non-injected, sclera clear ?OROPHARYNX: Exudative mass noted on the right oropharyngeal region ?NECK: Palpable right cervical lymphadenopathy. ?LYMPH:  no palpable lymphadenopathy in the cervical, axillary or inguinal ?LUNGS: clear to auscultation and percussion with normal breathing effort ?HEART: regular  rate & rhythm and no murmurs and no lower extremity edema ?ABDOMEN:abdomen soft, non-tender and normal bowel sounds ?Musculoskeletal:no cyanosis of digits and no clubbing  ?PSYCH: alert & oriented x 3 with fluent speech ?NEURO: no focal motor/sensory deficits ? ?LABORATORY DATA:  ?I have reviewed the data as listed ?Lab Results  ?Component Value Date  ? WBC 7.0 09/16/2021  ? HGB 13.9 09/16/2021  ? HCT 40.0 09/16/2021  ? MCV 88.7 09/16/2021  ? PLT 274 09/16/2021  ? ?  Chemistry   ?   ?Component Value Date/Time  ? NA 137 09/16/2021 1452  ? K 4.2 09/16/2021 1452  ? CL 101 09/16/2021 1452  ? CO2 30 09/16/2021 1452  ? BUN 7 09/16/2021 1452  ? CREATININE 0.75 09/16/2021 1452  ?    ?Component Value Date/Time  ? CALCIUM 9.2 09/16/2021 1452  ? ALKPHOS 57 08/19/2021 1012  ? AST 16 08/19/2021 1012  ? ALT 12 08/19/2021 1012  ? BILITOT 0.5 08/19/2021 1012  ?  ? ? ? ?RADIOGRAPHIC STUDIES: ?I have personally reviewed the radiological images as listed and agreed with the findings in the report. ?CT Soft Tissue Neck W Contrast ? ?Result Date: 08/19/2021 ?CLINICAL DATA:  Patient complains of spitting out his sinuses, spitting out  great chunks of tissue EXAM: CT MAXILLOFACIAL WITH CONTRAST; CT NECK WITH CONTRAST TECHNIQUE: Multidetector CT imaging of the neck was performed using the standard protocol following the bolus administration of intravenous

## 2021-09-16 NOTE — Progress Notes (Signed)
[name] ?CONSULT NOTE ? ?Patient Care Team: ?Patient, No Pcp Per (Inactive) as PCP - General (General Practice) ?Atilano Ina, MD as Referring Physician (Neurosurgery) ? ?CHIEF COMPLAINTS/PURPOSE OF CONSULTATION:  ?SCC oropharynx ? ?ASSESSMENT & PLAN:  ? 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  ?He is here before C1D1 of chemotherapy. ?Labs reviewed and ok to proceed with chemotherapy tomorrow. ? ?Cancer related pain ?He has been taking oxycodone half tablet every 3 hours for pain control.  Encouraged him to try 1 full tablet and try to extend the interval to every 4-6 hours if possible.  He definitely give it a try.  The pain is quite excruciating radiating to the right ear.  We will continue to monitor this. ? ? ?No orders of the defined types were placed in this encounter. ? ? ? ?HISTORY OF PRESENTING ILLNESS:  ?Johnny Mata 46 y.o. 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 ? ?Oncology History  ?Primary cancer of tonsillar fossa ([place])  ?08/19/2021 Imaging  ? Patient complained of spitting chunks of tissue hence had a CT maxillofacial with contrast as well as with the neck with contrast.  Mucosal hyperenhancement in the right nasopharynx extending to the oropharynx with mostly soft tissue density in the region of right palatine tonsil most suspicious for malignancy.  There is also a 1.8 x 1.5 x 2.4 cm peripherally enhancing hyperintense lesion in the right retropharynx suspicious for necrotic lymph node with additional prominent right level 2 lymph nodes measuring up to 1.3 cm in short axis with suspected extracapsular extension at level to me. ?  ?08/27/2021 Initial Diagnosis  ? Primary cancer of tonsillar fossa ([place]) ? ?  ?09/01/2021 PET scan  ? PET scan from April 17 showed locally  advanced right palatine tonsil primary with ipsilateral cervical nodal metastasis no evidence of hypermetabolic extracervical metastasis.  Tiny pulmonary nodules below PET resolution consider chest CT follow-up at 6 months ?  ?09/03/2021 Cancer Staging  ? Staging form: Pharynx - P16 Negative Oropharynx, AJCC 8th Edition ?- Clinical stage from 09/03/2021: Stage IVA (cT3, cN2b, cM0, p16-) - Signed by Eppie Gibson, MD on 09/03/2021 ?Stage prefix: Initial diagnosis ? ?  ?09/17/2021 -  Chemotherapy  ? Patient is on Treatment Plan : HEAD/NECK Cisplatin q7d  ? ?  ?  ? ?He will start first cycle of chemotherapy tomorrow. ?Since last visit.  He continues to have severe pain around his right ear radiating from the tumor.  He has been taking oxycodone half tablet every 3 hours for pain control.  He has not tried the full tablet yet.  Besides the pain, he is able to swallow food.  He denies any worsening breathing or change in bowel habits or change in urinary habits. ?Rest of the pertinent 10 point ROS reviewed and negative ? ?MEDICAL HISTORY:  ?Past Medical History:  ?Diagnosis Date  ? Anxiety   ? Never officially diagnosed; does not take medication for anxiety  ? Asthma   ? Hasn't had an episode since childhood  ? Depression   ? ? ?SURGICAL HISTORY: ?Past Surgical History:  ?Procedure Laterality Date  ? ELBOW SURGERY Bilateral   ? HIP SURGERY Left   ? IR GASTROSTOMY TUBE MOD SED  09/11/2021  ? IR IMAGING GUIDED PORT INSERTION  09/11/2021  ? NECK SURGERY  05/09/2018  ?  Cervical fusion (Dr. Atilano Ina)  ? PANENDOSCOPY N/A 08/19/2021  ? Procedure: PANENDOSCOPY WITH BIPOSY--direct laryngoscopy, flexible bronchoscopy, rigid esophagoscopy, biopsy of lesion in throat;  Surgeon: Marcina Millard, MD;  Location: [facility];  Service: ENT;  Laterality: N/A;  ? PLANTAR FASCIA SURGERY    ? SHOULDER SURGERY Left   ? TONSILLECTOMY    ? [place] EXTRACTION Bilateral 1995  ? ? ?SOCIAL HISTORY: ?Social History  ? ?Socioeconomic History  ?  Marital status: Single  ?  Spouse name: Not on file  ? Number of children: Not on file  ? Years of education: Not on file  ? Highest education level: Not on file  ?Occupational History  ? Not on file  ?Tobacco Use  ? Smoking status: Former  ?  Packs/day: 1.50  ?  Years: 20.00  ?  Pack years: 30.00  ?  Types: Cigarettes  ?  Quit date: 10/02/2019  ?  Years since quitting: 1.9  ? Smokeless tobacco: Never  ?Vaping Use  ? Vaping Use: Never used  ?Substance and Sexual Activity  ? Alcohol use: Not Currently  ?  Alcohol/week: 7.0 standard drinks  ?  Types: 7 Shots of liquor per week  ? Drug use: Yes  ?  Types: Marijuana  ? Sexual activity: Not Currently  ?Other Topics Concern  ? Not on file  ?Social History Narrative  ? Not on file  ? ?Social Determinants of Health  ? ?Financial Resource Strain: Medium Risk  ? Difficulty of Paying Living Expenses: Somewhat hard  ?Food Insecurity: Not on file  ?Transportation Needs: No Transportation Needs  ? Lack of Transportation (Medical): No  ? Lack of Transportation (Non-Medical): No  ?Physical Activity: Not on file  ?Stress: Not on file  ?Social Connections: Not on file  ?Intimate Partner Violence: Not on file  ? ? ?FAMILY HISTORY: ?Family History  ?Problem Relation Age of Onset  ? Colon cancer Mother   ? ? ?ALLERGIES:  is allergic to dust mite extract, other, and pollen extract-tree extract [pollen extract]. ? ?MEDICATIONS:  ?Current Outpatient Medications  ?Medication Sig Dispense Refill  ? dexamethasone (DECADRON) 4 MG tablet Take 2 tablets (8 mg total) by mouth daily. Take daily x 3 days starting the day after cisplatin chemotherapy. Take with food. 30 tablet 1  ? diphenhydrAMINE (BENADRYL) 25 mg capsule Take 1 capsule (25 mg total) by mouth daily as needed for sleep. 30 capsule 0  ? HYDROcodone-acetaminophen (NORCO) 10-325 MG tablet Take 0.5-1 tablets by mouth every 6 (six) hours as needed. 45 tablet 0  ? ibuprofen (ADVIL) 200 MG tablet Take 400 mg by mouth See admin instructions.  Pt states they are taking 400 mg every 2-3 hours as needed.    ? Ibuprofen-diphenhydrAMINE Cit (IBUPROFEN PM PO) Take 2 tablets by mouth at bedtime.    ? lidocaine-prilocaine (EMLA) cream Apply to affected area once 30 g 3  ? ondansetron (ZOFRAN) 8 MG tablet Take 1 tablet (8 mg total) by mouth 2 (two) times daily as needed. Start on the third day after cisplatin chemotherapy. 30 tablet 1  ? prochlorperazine (COMPAZINE) 10 MG tablet Take 1 tablet (10 mg total) by mouth every 6 (six) hours as needed (Nausea or vomiting). 30 tablet 1  ? sertraline (ZOLOFT) 100 MG tablet Take 100 mg by mouth daily.    ? traZODone (DESYREL) 50 MG tablet Take 50 mg by mouth at bedtime. Able to take 50-'200mg'$  as needed for sleep    ? ?No current  facility-administered medications for this visit.  ? ? ? ?PHYSICAL EXAMINATION: ?ECOG PERFORMANCE STATUS: 0 - Asymptomatic ? ?Vitals:  ? 09/16/21 1618  ?BP: (!) 140/99  ?Pulse: 78  ?Resp: 18  ?Temp: 99 ?F (37.2 ?C)  ?SpO2: 97%  ? ?Filed Weights  ? 09/16/21 1618  ?Weight: 159 lb 1.6 oz (72.2 kg)  ? ? ?GENERAL:alert, no distress and comfortable ?OROPHARYNX: Exudative mass noted on the right oropharyngeal region ?NECK: Palpable right cervical lymphadenopathy.  Extremely painful on palpation. ?LYMPH:  no palpable lymphadenopathy in the cervical, axillary or inguinal ? ? ?LABORATORY DATA:  ?I have reviewed the data as listed ?Lab Results  ?Component Value Date  ? WBC 7.0 09/16/2021  ? HGB 13.9 09/16/2021  ? HCT 40.0 09/16/2021  ? MCV 88.7 09/16/2021  ? PLT 274 09/16/2021  ? ?  Chemistry   ?   ?Component Value Date/Time  ? NA 137 09/16/2021 1452  ? K 4.2 09/16/2021 1452  ? CL 101 09/16/2021 1452  ? CO2 30 09/16/2021 1452  ? BUN 7 09/16/2021 1452  ? CREATININE 0.75 09/16/2021 1452  ?    ?Component Value Date/Time  ? CALCIUM 9.2 09/16/2021 1452  ? ALKPHOS 57 08/19/2021 1012  ? AST 16 08/19/2021 1012  ? ALT 12 08/19/2021 1012  ? BILITOT 0.5 08/19/2021 1012  ?  ? ? ? ?RADIOGRAPHIC STUDIES: ?I have personally  reviewed the radiological images as listed and agreed with the findings in the report. ?CT Soft Tissue Neck W Contrast ? ?Result Date: 08/19/2021 ?CLINICAL DATA:  Patient complains of spitting out his sinuse

## 2021-09-17 ENCOUNTER — Ambulatory Visit
Admission: RE | Admit: 2021-09-17 | Discharge: 2021-09-17 | Disposition: A | Discharge status: Home / Self Care | Payer: Commercial Managed Care - PPO | Source: Ambulatory Visit | Attending: Radiation Oncology | Admitting: Radiation Oncology

## 2021-09-17 ENCOUNTER — Inpatient Hospital Stay: Payer: Commercial Managed Care - PPO

## 2021-09-17 ENCOUNTER — Inpatient Hospital Stay: Payer: Commercial Managed Care - PPO | Admitting: Licensed Clinical Social Worker

## 2021-09-17 ENCOUNTER — Other Ambulatory Visit: Payer: Self-pay

## 2021-09-17 VITALS — BP 132/90 | HR 81 | Temp 97.7°F | Resp 17 | Wt 163.2 lb

## 2021-09-17 DIAGNOSIS — C090 Malignant neoplasm of tonsillar fossa: Secondary | ICD-10-CM

## 2021-09-17 DIAGNOSIS — Z5111 Encounter for antineoplastic chemotherapy: Secondary | ICD-10-CM | POA: Diagnosis not present

## 2021-09-17 LAB — RAD ONC ARIA SESSION SUMMARY
Course Elapsed Days: 0
Plan Fractions Treated to Date: 1
Plan Prescribed Dose Per Fraction: 2 Gy
Plan Total Fractions Prescribed: 35
Plan Total Prescribed Dose: 70 Gy
Reference Point Dosage Given to Date: 2 Gy
Reference Point Session Dosage Given: 2 Gy
Session Number: 1

## 2021-09-17 LAB — TSH: TSH: 1.85 u[IU]/mL (ref 0.350–4.500)

## 2021-09-17 MED ORDER — SODIUM CHLORIDE 0.9 % IV SOLN: Freq: Once | INTRAVENOUS | Status: AC

## 2021-09-17 MED ORDER — POTASSIUM CHLORIDE IN NACL 20-0.9 MEQ/L-% IV SOLN
Freq: Once | INTRAVENOUS | Status: AC
  Filled 2021-09-17: qty 1000

## 2021-09-17 MED ORDER — SODIUM CHLORIDE 0.9 % IV SOLN
10.0000 mg | Freq: Once | INTRAVENOUS | Status: AC
  Administered 2021-09-17: 10 mg via INTRAVENOUS
  Filled 2021-09-17: qty 10

## 2021-09-17 MED ORDER — SODIUM CHLORIDE 0.9 % IV SOLN
40.0000 mg/m2 | Freq: Once | INTRAVENOUS | Status: AC
  Administered 2021-09-17: 78 mg via INTRAVENOUS
  Filled 2021-09-17: qty 78

## 2021-09-17 MED ORDER — PALONOSETRON HCL INJECTION 0.25 MG/5ML
0.2500 mg | Freq: Once | INTRAVENOUS | Status: AC
  Administered 2021-09-17: 0.25 mg via INTRAVENOUS
  Filled 2021-09-17: qty 5

## 2021-09-17 MED ORDER — SODIUM CHLORIDE 0.9 % IV SOLN
150.0000 mg | Freq: Once | INTRAVENOUS | Status: AC
  Administered 2021-09-17: 150 mg via INTRAVENOUS
  Filled 2021-09-17: qty 150

## 2021-09-17 MED ORDER — MAGNESIUM SULFATE 2 GM/50ML IV SOLN
2.0000 g | Freq: Once | INTRAVENOUS | Status: AC
  Administered 2021-09-17: 2 g via INTRAVENOUS
  Filled 2021-09-17: qty 50

## 2021-09-17 NOTE — Progress Notes (Signed)
[facility] CSW Progress Note ? ?Clinical Social Worker met with patient in infusion to sign up for [employer]. Gave first of four disbursements today. No other questions or needs at this time. ? ? ? ?Christeen Douglas LCSW ?

## 2021-09-17 NOTE — Progress Notes (Signed)
Oncology Nurse Navigator Documentation  ? ?To provide support, encouragement and care continuity, met with Mr. Locklin for his initial RT and chemotherapy infusion.   ?I reviewed the 2-step treatment process, answered questions. Marland Kitchen ?Mr. Fretwell completed treatment without difficulty, denied questions/concerns. ?I reviewed the registration/arrival procedure for subsequent treatments. ?He requested help arranging transportation assistance through [facility]. I will contact the [facility] transportation coordinator.  ?I encouraged him to call me with questions/concerns as tmts proceed. ? ? ?Harlow Asa RN, BSN, OCN ?Head & Neck Oncology Nurse Navigator ?[facility] ?Phone # [phone]  ?Fax # [phone]   ?

## 2021-09-17 NOTE — Patient Instructions (Signed)
[facility] MEDICAL ONCOLOGY  Discharge Instructions: Thank you for choosing [facility] to provide your oncology and hematology care.   If you have a lab appointment with the Cancer Center, please go directly to the Cancer Center and check in at the registration area.   Wear comfortable clothing and clothing appropriate for easy access to any Portacath or PICC line.   We strive to give you quality time with your provider. You may need to reschedule your appointment if you arrive late (15 or more minutes).  Arriving late affects you and other patients whose appointments are after yours.  Also, if you miss three or more appointments without notifying the office, you may be dismissed from the clinic at the provider's discretion.      For prescription refill requests, have your pharmacy contact our office and allow 72 hours for refills to be completed.    Today you received the following chemotherapy and/or immunotherapy agents : Cisplatin    To help prevent nausea and vomiting after your treatment, we encourage you to take your nausea medication as directed.  BELOW ARE SYMPTOMS THAT SHOULD BE REPORTED IMMEDIATELY: *FEVER GREATER THAN 100.4 F (38 C) OR HIGHER *CHILLS OR SWEATING *NAUSEA AND VOMITING THAT IS NOT CONTROLLED WITH YOUR NAUSEA MEDICATION *UNUSUAL SHORTNESS OF BREATH *UNUSUAL BRUISING OR BLEEDING *URINARY PROBLEMS (pain or burning when urinating, or frequent urination) *BOWEL PROBLEMS (unusual diarrhea, constipation, pain near the anus) TENDERNESS IN MOUTH AND THROAT WITH OR WITHOUT PRESENCE OF ULCERS (sore throat, sores in mouth, or a toothache) UNUSUAL RASH, SWELLING OR PAIN  UNUSUAL VAGINAL DISCHARGE OR ITCHING   Items with * indicate a potential emergency and should be followed up as soon as possible or go to the Emergency Department if any problems should occur.  Please show the CHEMOTHERAPY ALERT CARD or IMMUNOTHERAPY ALERT CARD at check-in to  the Emergency Department and triage nurse.  Should you have questions after your visit or need to cancel or reschedule your appointment, please contact [facility] MEDICAL ONCOLOG[facility]  Dept: [phone]  and follow the prompts.  Office hours are 8:00 a.m. to 4:30 p.m. Monday - Friday. Please note that voicemails left after 4:00 p.m. may not be returned until the following business day.  We are closed weekends and major holidays. You have access to a nurse at all times for urgent questions. Please call the main number to the clinic Dept: [phone] and follow the prompts.   For any non-urgent questions, you may also contact your provider using MyChart. We now offer e-Visits for anyone 18 and older to request care online for non-urgent symptoms. For details visit [email or web address].   Also download the MyChart app! Go to the app store, search "MyChart", open the app, select [facility], and log in with your MyChart username and password.  Due to Covid, a mask is required upon entering the hospital/clinic. If you do not have a mask, one will be given to you upon arrival. For doctor visits, patients may have 1 support person aged 18 or older with them. For treatment visits, patients cannot have anyone with them due to current Covid guidelines and our immunocompromised population.   

## 2021-09-18 ENCOUNTER — Ambulatory Visit: Payer: Commercial Managed Care - PPO | Attending: Radiation Oncology | Admitting: Physical Therapy

## 2021-09-18 ENCOUNTER — Ambulatory Visit: Payer: Commercial Managed Care - PPO | Attending: Radiation Oncology

## 2021-09-18 ENCOUNTER — Inpatient Hospital Stay: Payer: Commercial Managed Care - PPO | Admitting: Nutrition

## 2021-09-18 ENCOUNTER — Other Ambulatory Visit: Payer: Self-pay

## 2021-09-18 ENCOUNTER — Ambulatory Visit
Admission: RE | Admit: 2021-09-18 | Discharge: 2021-09-18 | Disposition: A | Discharge status: Home / Self Care | Payer: Commercial Managed Care - PPO | Source: Ambulatory Visit | Attending: Radiation Oncology | Admitting: Radiation Oncology

## 2021-09-18 ENCOUNTER — Telehealth: Payer: Self-pay

## 2021-09-18 ENCOUNTER — Encounter: Payer: Self-pay | Admitting: Physical Therapy

## 2021-09-18 DIAGNOSIS — R293 Abnormal posture: Secondary | ICD-10-CM | POA: Diagnosis present

## 2021-09-18 DIAGNOSIS — Z5111 Encounter for antineoplastic chemotherapy: Secondary | ICD-10-CM | POA: Diagnosis not present

## 2021-09-18 DIAGNOSIS — R1310 Dysphagia, unspecified: Secondary | ICD-10-CM | POA: Diagnosis present

## 2021-09-18 DIAGNOSIS — C090 Malignant neoplasm of tonsillar fossa: Secondary | ICD-10-CM

## 2021-09-18 LAB — RAD ONC ARIA SESSION SUMMARY
Course Elapsed Days: 1
Plan Fractions Treated to Date: 2
Plan Prescribed Dose Per Fraction: 2 Gy
Plan Total Fractions Prescribed: 35
Plan Total Prescribed Dose: 70 Gy
Reference Point Dosage Given to Date: 4 Gy
Reference Point Session Dosage Given: 2 Gy
Session Number: 2

## 2021-09-18 NOTE — Progress Notes (Signed)
46 year old male diagnosed with tonsil cancer followed by Dr. Chryl Heck and Dr. Isidore Moos.  Patient is receiving weekly cisplatin with daily radiation therapy. ? ?Past medical history includes tobacco, anxiety, depression, and asthma. ? ?Medications include Zofran, Compazine, Decadron, and Zoloft. ? ?Labs include glucose 100 on May 2. ? ?Height: 6 feet 0 inches. ?Weight: 163 pounds 4 ounces. ?Usual body weight: 173 pounds in April 2023. ?BMI: 22.14. ? ?Patient's status post PEG on April 27.  Nursing completed tube feeding education and provided complementary case of Osmolite 1.5. ?Patient reports PEG still causes him pain however it is improving. ?Reports increased nausea today, likely secondary to his first chemotherapy yesterday. ?Reports having ear pain. ?Reports he currently chews and swallows without difficulty. ?He is consuming soft foods and tries to consume adequate calories and protein. ?Reports his weight has always fluctuated at least 10 pounds in a month. ?Is open to trying oral nutrition supplements. ?Deferred nutrition focused physical exam as patient is in increased pain today and anxious to get to radiation therapy. ? ?Nutrition diagnosis: Inadequate oral intake related to tonsil cancer and associated treatments as evidenced by 10 pound weight loss from usual body weight. ? ?Intervention: ?Educated on importance of consuming high-calorie, high-protein foods in small frequent meals and snacks. ?Reviewed examples of high-protein foods. ?Encouraged nausea medication as prescribed. ?Provided nutrition fact sheets. ?Encouraged increased water intake. ?Discussed importance of bowel regimen. ?Provided nutrition samples and coupons.  Contact information given.  Questions were answered. ? ?Monitoring, evaluation, goals: ?Patient will tolerate adequate calories and protein to minimize weight loss. ? ?Next visit: Weekly follow-ups with infusion on Wednesdays with Vinnie Level. ?

## 2021-09-18 NOTE — Patient Instructions (Signed)
SWALLOWING EXERCISES ?Do these until 6 months after your last day of radiation, then 2-3 times per week afterwards ? ?Effortful Swallows ?- Press your tongue against the roof of your mouth for 3 seconds, then swallow as hard as you can on your saliva or a sip of water ?- Repeat 10-15 times, 2-3 times a day, and use whenever you eat or drink ? ?[place] Swallow - swallow with your tongue sticking out ?- Stick tongue out past your lips and gently bite tongue with your teeth ?- Swallow, while holding your tongue with your teeth ?- Repeat 10-15 times, 2-3 times a day ?*use a wet spoon if your mouth gets dry* ? ?[place] Maneuver  - swallow and squeeze tight! ?- Start to swallow, and squeeze as tight as you can for  5  seconds ?- Repeat 10-15 times, 2-3 times a day ?*use a wet spoon if your mouth gets dry* ? ?Chin pushback ?- Open your mouth slightly ?- Place your fist UNDER your chin near your neck ?- Tuck your chin and push back with your fist for 5 seconds ?- Repeat 10 times, 2-3 times a day ?

## 2021-09-18 NOTE — Therapy (Signed)
?OUTPATIENT PHYSICAL THERAPY HEAD AND NECK BASELINE EVALUATION ? ? ?Patient Name: Johnny Mata ?MRN: [id number] ?DOB:18-Apr-1976, 46 y.o., male ?Today's Date: 09/18/2021 ? ? ? ?Past Medical History:  ?Diagnosis Date  ? Anxiety   ? Never officially diagnosed; does not take medication for anxiety  ? Asthma   ? Hasn't had an episode since childhood  ? Depression   ? ?Past Surgical History:  ?Procedure Laterality Date  ? ELBOW SURGERY Bilateral   ? HIP SURGERY Left   ? IR GASTROSTOMY TUBE MOD SED  09/11/2021  ? IR IMAGING GUIDED PORT INSERTION  09/11/2021  ? NECK SURGERY  05/09/2018  ? Cervical fusion (Dr. Atilano Ina)  ? PANENDOSCOPY N/A 08/19/2021  ? Procedure: PANENDOSCOPY WITH BIPOSY--direct laryngoscopy, flexible bronchoscopy, rigid esophagoscopy, biopsy of lesion in throat;  Surgeon: Marcina Millard, MD;  Location: [facility];  Service: ENT;  Laterality: N/A;  ? PLANTAR FASCIA SURGERY    ? SHOULDER SURGERY Left   ? TONSILLECTOMY    ? [place] EXTRACTION Bilateral 1995  ? ?Patient Active Problem List  ? Diagnosis Date Noted  ? Cancer related pain 09/16/2021  ? Port-A-Cath in place 09/15/2021  ? 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 Tonsillar mass 08/19/2021  ? Throat mass   ? Alcohol abuse with intoxication ([place]) 08/24/2016  ? Closed displaced fracture of body of left scapula 08/23/2016  ? Cerebral contusion ([place]) 08/22/2016  ? ? ?PCP: no PCP ? ?REFERRING PROVIDER: Eppie Gibson, MD ? ?REFERRING DIAG: C09.0 (ICD-10-CM) - Primary cancer of tonsillar fossa ([facility])  ? ?THERAPY DIAG:  ?Abnormal posture ? ?ONSET DATE: 08/19/21 ? ?SUBJECTIVE    ?SUBJECTIVE STATEMENT: ?Patient reports they are here today to be seen by their medical team for her newly diagnosed tonsillar fossa cancer.  ? 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 CT also showed a 1.8 cm x 1.5 cm x 2.4 cm peripherally enhancing hypodense lesion in the lateral right retropharynx suspicious for a necrotic node, as well as additional prominent right level II lymph nodes measuring up to 1.3 cm in short axis with suspected extracapsular extension at level IIB. 4/4 Biopsy of the tonsillar fossa and soft palate revealed invasive poorly differentiated SCC. 4/5 CT chest showed no definitive signs of metastatic disease to his chest. 4/17 PET revealed locally advanced right palatine tonsil primary with ipsilateral cervical node metastasis, no evidence of hypermetabolic extra cervical metastasis, tiny pulmonary nodules, below PET resolution, consider chest CT follow up at 6 months. Will receive weekly chemotherapy and 35 fractions of radiation to his Oropharynx and bilateral neck.  He started on 09/17/21 and will complete on 11/05/21. PAC & PEG 09/12/21.  ?PATIENT GOALS   to be educated about the signs and symptoms of lymphedema and learn post op HEP.  ? ?PAIN:  ?Are you having pain? Yes: NPRS scale: 2/10 ?Pain location: throat ?Pain description: dull, throbbing ?Aggravating factors: talking ?Relieving factors: chemo ? ? ?PRECAUTIONS: Active CA ? ?WEIGHT BEARING RESTRICTIONS No ? ?FALLS:  ?Has patient fallen in last 6 months? No ?Does the patient have a fear of falling that limits activity? No ?Is the patient reluctant to leave the  house due to a fear of falling?No ? ?LIVING ENVIRONMENT: ?Patient lives with: alone ?Lives in: House/apartment ?Has following equipment at home: Crutches and None ? 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 ?LEISURE: pt reports he has not been exercising, but has recently started walking, has had 6 or 7 surgeries in the last  4 years ? ?PRIOR LEVEL OF FUNCTION: Independent ? ? ?OBJECTIVE ? ?COGNITION: ?          Overall cognitive status: Within functional limits for tasks assessed                 ? ?POSTURE:  ?Forward head and rounded shoulders posture ? ? 30 SEC SIT TO STAND: ?13 reps in 30 sec without use of UEs which is  Poor for patient's age. 23 is average for his age.  ? ? ?SHOULDER AROM:   WFL ? ? ? ?CERVICAL AROM: ? ? Percent limited  ?Flexion 50% limited - pt has a plate in neck, 2 discs removed, has p! With this   ?Extension 25% limited - tumor pain  ?Right lateral flexion 50% limited  ?Left lateral flexion 50% limited  ?Right rotation 25% limited - tumor pain  ?Left rotation 25% limited - tumor pain  ?  (Blank rows=not tested) ? ? ?GAIT: ? Assessed: Yes ?Assistance needed: Independent ? Ambulation Distance: 10 feet ? Assistive Device: None ?Gait pattern: WFL ?Ambulation surface: Level ? ?PATIENT EDUCATION:  ?Education details: Neck ROM, importance of posture when sitting, standing and lying down, deep breathing, walking program and importance of staying active throughout treatment, CURE article on staying active, "Why exercise?" flyer, lymphedema and PT info ?Person educated: Patient ?Education method: Explanation, Demonstration, Handout ?Education comprehension: Patient verbalized understanding and returned demonstration ? ? ?HOME EXERCISE PROGRAM: ?Patient was instructed today in a home exercise program today for head and neck range of motion exercises. These included active cervical flexion, active cervical extension, active cervical rotation to each direction, upper trap stretch, and shoulder retraction. Patient was encouraged to do these 2-3 times a day, holding for 5 sec each and completing for 5 reps. Pt was educated that once this becomes easier then hold the stretches for 30-60 seconds.  ? ? ?ASSESSMENT: ? ?CLINICAL IMPRESSION: ?Pt arrives to PT with recently diagnosed oropharyngeal cancer. Pt will undergo chemo and  radiation. Pt will start treatment on 09/17/21 and complete on 11/05/21.  Pt's cervical ROM was limited at baseline. Pt reports he has a plate in his neck due to a previous surgery and also has pain from his tumor with neck ROM. Educated pt about signs and symptoms of lymphedema as well as anatomy and physiology of lymphatic system. Educated pt in importance of staying as active as possible throughout treatment to decrease fatigue as well as head and neck ROM exercises to decrease loss of ROM. Will see pt after completion of radiation to reassess ROM and assess for lymphedema and to determine therapy needs at that time. ? ?Pt will benefit from skilled therapeutic intervention to improve on the following deficits: Decreased knowledge of precautions and postural dysfunction. Other deficits: decreased knowledge of condition and postural dysfunction ? ?PT treatment/interventions: ADL/self-care home management, pt/family education, therapeutic exercise. Other interventions Therapeutic exercises and Patient/Family education ? ?REHAB POTENTIAL: Good ? ?CLINICAL DECISION MAKING: Stable/uncomplicated ? ?EVALUATION COMPLEXITY: Low ? ? ?GOALS: ?Goals reviewed with patient? YES ? ?LONG TERM GOALS: (STG=LTG) ? ? Name  Target Date Goal status  ?1 Patient will be able to verbalize understanding of a home exercise program for cervical range of motion, posture, and walking.   ?Baseline:  No knowledge 09/18/2021 Achieved at eval  ?2 Patient will be able to verbalize understanding of proper sitting and standing posture. ?Baseline:  No knowledge 09/18/2021 Achieved at eval  ?3 Patient will be able to verbalize understanding of lymphedema risk and availability of treatment for this condition ?Baseline:  No knowledge 09/18/2021 Achieved at eval  ?4 Pt will demonstrate a return to full cervical ROM and function post operatively compared to baselines and not demonstrate any signs or symptoms of lymphedema.  ?Baseline: See objective measurements  taken today. 11/27/2021 New  ? ? ? ?PLAN: ?PT FREQUENCY/DURATION: EVAL and 1 follow up appointment.  ? ?PLAN FOR NEXT SESSION: will reassess 2 weeks after completion of radiation to determine needs. ? ?Pati

## 2021-09-18 NOTE — Telephone Encounter (Signed)
?   Johnny Mata ?DOB: 06-06-1975 ?MRN: [id number]  ? ?RIDER WAIVER AND RELEASE OF LIABILITY ? ?For purposes of improving physical access to our facilities, [name] is pleased to partner with third parties to provide [facility] patients or other authorized individuals the option of convenient, on-demand ground transportation services (the ?[employer]?) through use of the technology service that enables users to request on-demand ground transportation from independent third-party providers. ? ?By opting to use and accept these [employer], I, the undersigned, hereby agree on behalf of myself, and on behalf of any minor child using the Government social research officer for whom I am the parent or legal guardian, as follows: ? ?Government social research officer provided to me are provided by independent third-party transportation providers who are not [employer] or employees and who are unaffiliated with [employer]. ?[name] is neither a transportation carrier nor a common or public carrier. ?[name] has no control over the quality or safety of the transportation that occurs as a result of the [employer]. ?[name] cannot guarantee that any third-party transportation provider will complete any arranged transportation service. ?[name] makes no representation, warranty, or guarantee regarding the reliability, timeliness, quality, safety, suitability, or availability of any of the Transport Services or that they will be error free. ?I fully understand that traveling by vehicle involves risks and dangers of serious bodily injury, including permanent disability, paralysis, and death. I agree, on behalf of myself and on behalf of any minor child using the Transport Services for whom I am the parent or legal guardian, that the entire risk arising out of my use of the [employer] remains solely with me, to the maximum extent permitted under applicable law. ?The [employer] are provided ?as  is? and ?as available.? [name] disclaims all representations and warranties, express, implied or statutory, not expressly set out in these terms, including the implied warranties of merchantability and fitness for a particular purpose. ?I hereby waive and release [name], its agents, employees, officers, directors, representatives, insurers, attorneys, assigns, successors, subsidiaries, and affiliates from any and all past, present, or future claims, demands, liabilities, actions, causes of action, or suits of any kind directly or indirectly arising from acceptance and use of the [employer]. ?I further waive and release [name] and its affiliates from all present and future liability and responsibility for any injury or death to persons or damages to property caused by or related to the use of the [employer]. ?I have read this Waiver and Release of Liability, and I understand the terms used in it and their legal significance. This Waiver is freely and voluntarily given with the understanding that my right (as well as the right of any minor child for whom I am the parent or legal guardian using the [employer]) to legal recourse against  in connection with the [employer] is knowingly surrendered in return for use of these services. ? ? ?I attest that I read the consent document to Johnny Mata, gave [name] the opportunity to ask questions and answered the questions asked (if any). I affirm that Johnny Mata then provided consent for he's participation in this program.   ?  ?Johnny Mata ? ?                          ?

## 2021-09-18 NOTE — Therapy (Signed)
?OUTPATIENT SPEECH LANGUAGE PATHOLOGY ONCOLOGY EVALUATION ? ? ?Patient Name: Johnny Mata ?MRN: [id number] ?DOB:1975/07/05, 46 y.o., male ?Today's Date: 09/18/2021 ? ?PCP: No PCP ?REFERRING PROVIDER: Eppie Gibson, MD ? ? End of Session - 09/18/21 1134   ? ? Visit Number 1   ? Number of Visits 4   ? Date for SLP Re-Evaluation 12/17/21   ? SLP Start Time 1106   ? SLP Stop Time  1140   ? SLP Time Calculation (min) 34 min   ? Activity Tolerance Patient tolerated treatment well   ? ?  ?  ? ?  ? ? ?Past Medical History:  ?Diagnosis Date  ? Anxiety   ? Never officially diagnosed; does not take medication for anxiety  ? Asthma   ? Hasn't had an episode since childhood  ? Depression   ? ?Past Surgical History:  ?Procedure Laterality Date  ? ELBOW SURGERY Bilateral   ? HIP SURGERY Left   ? IR GASTROSTOMY TUBE MOD SED  09/11/2021  ? IR IMAGING GUIDED PORT INSERTION  09/11/2021  ? NECK SURGERY  05/09/2018  ? Cervical fusion (Dr. Atilano Ina)  ? PANENDOSCOPY N/A 08/19/2021  ? Procedure: PANENDOSCOPY WITH BIPOSY--direct laryngoscopy, flexible bronchoscopy, rigid esophagoscopy, biopsy of lesion in throat;  Surgeon: Marcina Millard, MD;  Location: [facility];  Service: ENT;  Laterality: N/A;  ? PLANTAR FASCIA SURGERY    ? SHOULDER SURGERY Left   ? TONSILLECTOMY    ? [place] EXTRACTION Bilateral 1995  ? ?Patient Active Problem List  ? Diagnosis Date Noted  ? Cancer related pain 09/16/2021  ? Port-A-Cath in place 09/15/2021  ? 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 Primary cancer of tonsillar fossa ([facility]) 08/27/2021  ? Tonsillar mass 08/19/2021  ? Throat mass   ? Alcohol abuse with intoxication ([facility]) 08/24/2016  ? Closed displaced fracture of body of left scapula 08/23/2016  ? Cerebral contusion ([facility]) 08/22/2016  ? ? ?ONSET DATE: 08/19/21  ? ?REFERRING DIAG: Oropharyngeal SCC. ? ?THERAPY DIAG:  ?Dysphagia, unspecified type ? ?SUBJECTIVE:  ? ?SUBJECTIVE STATEMENT: ?Pt stated he  has eaten dys I-II foods mostly, until today and has found more dys III items are easier to clear oropharyngeally than in past 2-3 weeks. Pt has been modifying bite size to ensure oropharyngeal clearance. ?Pt accompanied by: self ? ?PERTINENT HISTORY:  He presented to the ED 08/19/21 with severe pain on posterior roof of his mouth and endorsed coughing up grey chunks of tissue. 4/4 CT neck showed a mucosal hyperenhancement in the right nasopharynx extending to the oropharynx, with masslike soft tissue density in the region of the right palatine tonsil, suspicious for malignancy. CT also showed a 1.8 cm x 1.5 cm x 2.4 cm peripherally enhancing hypodense lesion in the lateral right retropharynx suspicious for a necrotic node, as well as additional prominent right level II lymph nodes measuring up to 1.3 cm in short axis with suspected extracapsular extension at level IIB. 4/4 Biopsy of the tonsillar fossa and soft palate revealed invasive poorly differentiated SCC. 4/5 CT chest showed no definitive signs of metastatic disease to his chest. 4/17 PET revealed locally advanced right palatine tonsil primary with ipsilateral cervical node metastasis, no evidence of hypermetabolic extra cervical metastasis, tiny pulmonary nodules, below PET resolution, consider chest CT follow up at 6 months. Consult with Dr. Isidore Moos on 4/12 and Dr. Chryl Heck on 4/21. He will receive weekly chemotherapy and 35 fractions of radiation to his Oropharynx  and bilateral neck.  He started on 09/17/21 and will complete on 11/05/21. PAC & PEG 09/12/21. He has a history of smoking 1.5 packs per day and quit 09/2019. He uses marijuana daily (smoking and edibles). He denies alcohol consumption. ? ?PAIN:  ?Are you having pain? Yes: NPRS scale: 2-3/10 ?Pain location: rt side of throat ?Pain description: throbbing, stabbing soreness ?Aggravating factors: swallowing ?Relieving factors: meds, chemo ? ?FALLS: Has patient fallen in last 6 months?  No ? ?LIVING  ENVIRONMENT: ?Lives with: lives alone ?Lives in: House/apartment ? ?PLOF:  ?Level of assistance: Independent with ADLs ?Employment: Full-time employment  ? ? ?PATIENT GOALS Maintain WNL swallowing. "Keep my throat from becoming a tree trunk." ? ?OBJECTIVE:  ? ?DIAGNOSTIC FINDINGS: See "pertinent history" above. ? ? ?COGNITION: ?Overall cognitive status: Within functional limits for tasks assessed ? ? LANGUAGE: ?Receptive and Expressive language appeared WNL. ? ?ORAL MOTOR ASSESSMENT:   ?WFL ? ?MOTOR SPEECH: ?Overall motor speech: Appears intact ? ?CLINICAL SWALLOW ASSESSMENT:   ?Current diet: Dysphagia 3 (mechanical soft), Dysphagia 2 (chopped/minced), and thin liquids ?Objective recommended compensations: small bites/sips, chew thoroughly ?Dentition: adequate natural dentition ?Patient directly observed with POs: Yes: dysphagia 3 (soft) and thin liquids  ?Feeding: able to feed self ?Liquids provided by: cup ?Oral phase signs and symptoms: prolonged bolus formation and this was reportedly mainly due to pain ?Pharyngeal phase signs and symptoms:  none noted during this visit. ? ? ?TODAY'S TREATMENT:  ?Research states the risk for dysphagia increases due to radiation and/or chemotherapy treatment due to a variety of factors, so SLP educated the pt about the possibility of reduced/limited ability for PO intake during rad tx. SLP also educated pt regarding possible changes to swallowing musculature after rad tx, and why adherence to dysphagia HEP provided today and PO consumption was necessary to inhibit muscle fibrosis following rad tx and to mitigate muscle disuse atrophy. SLP informed pt why this would be detrimental to their swallowing status and to their pulmonary health. Pt demonstrated understanding of these things to SLP. SLP encouraged pt to safely eat and drink as deep into their radiation/chemotherapy as possible to provide the best possible long-term swallowing outcome for pt.  ?  ?SLP then developed an  individualized HEP for pt involving oral and pharyngeal strengthening and ROM and pt was instructed how to perform these exercises, including SLP demonstration. After SLP demonstration, pt return demonstrated each exercise. SLP ensured pt performance was correct prior to educating pt on next exercise. Pt required min cues faded to modified independent to perform HEP. Pt was instructed to complete this program 6-7 days/week, at least 2 times a day until 6 months after his or her last day of rad tx, and then x2 a week after that, indefinitely. Among other modifications for days when pt cannot functionally swallow, SLP also suggested pt to perform only non-swallowing tasks on the handout/HEP, and if necessary to cycle through the swallowing portion so the full program of exercises can be completed instead of fatiguing on one of the swallowing exercises and being unable to perform the other swallowing exercises. SLP instructed that swallowing exercises should then be added back into the regimen as pt is able to do so. Secondly, pt was told that former patients have told SLP that during their course of radiation therapy, taking prescribed pain medication just prior to performing HEP (and eating/drinking) has proven helpful in completing HEP (and eating and drinking) more regularly when going through their course of radiation treatment.  ? ? ?  PATIENT EDUCATION: ?Education details: late effects head/neck radiation on swallow function, HEP procedure, and modification to HEP when difficulty experienced with swallowing during and after radiation course ?Person educated: Patient ?Education method: Explanation, Demonstration, Verbal cues, and Handouts ?Education comprehension: verbalized understanding, returned demonstration, verbal cues required, and needs further education ? ? ?ASSESSMENT: ? ?CLINICAL IMPRESSION: ?Patient is a 46 y.o. male who was seen today for assessment of swallowing as they undergo  radiation/chemoradiation therapy. Today pt ate items from Dys III and drank thin liquids. POs: Pt ate [place] sandwich and drank thin liquids without overt s/s oral or pharyngeal difficulty. At this time pt swallowing is deemed WNL/WFL with

## 2021-09-18 NOTE — Progress Notes (Signed)
Oncology Nurse Navigator Documentation  ? ?He presented for head and neck MDC today. He was seen by SLP and PT and had no questions or concerns at this time. He knows to call me if he has any needs. ? ?Harlow Asa RN, BSN, OCN ?Head & Neck Oncology Nurse Navigator ?[facility] ?Phone # [phone]  ?Fax # [phone]  ?

## 2021-09-18 NOTE — Telephone Encounter (Signed)
-----   Message from [name], RN sent at 09/17/2021  2:25 PM EDT ----- ?Regarding: first time treatment call back- Johnny Mata ?Patient received chemo for the first time today. He is seen here by Dr Chryl Heck. He received cisplatin today with no issues :)) ? ? ?

## 2021-09-18 NOTE — Telephone Encounter (Signed)
Johnny Mata states that he is doing fine.  He had som nausea earlier today and took his nausea medication with good effect. ?He states that he is eating, drinking, and urinating well. ?

## 2021-09-19 ENCOUNTER — Other Ambulatory Visit: Payer: Self-pay

## 2021-09-19 ENCOUNTER — Other Ambulatory Visit: Payer: Self-pay | Admitting: Hematology and Oncology

## 2021-09-19 ENCOUNTER — Ambulatory Visit
Admission: RE | Admit: 2021-09-19 | Discharge: 2021-09-19 | Disposition: A | Discharge status: Home / Self Care | Payer: Commercial Managed Care - PPO | Source: Ambulatory Visit | Attending: Radiation Oncology | Admitting: Radiation Oncology

## 2021-09-19 DIAGNOSIS — Z5111 Encounter for antineoplastic chemotherapy: Secondary | ICD-10-CM | POA: Diagnosis not present

## 2021-09-19 LAB — RAD ONC ARIA SESSION SUMMARY
Course Elapsed Days: 2
Plan Fractions Treated to Date: 3
Plan Prescribed Dose Per Fraction: 2 Gy
Plan Total Fractions Prescribed: 35
Plan Total Prescribed Dose: 70 Gy
Reference Point Dosage Given to Date: 6 Gy
Reference Point Session Dosage Given: 2 Gy
Session Number: 3

## 2021-09-19 MED ORDER — HYDROCODONE-ACETAMINOPHEN 10-325 MG PO TABS: 0.5000 | ORAL_TABLET | Freq: Four times a day (QID) | ORAL | 0 refills | Status: DC | PRN

## 2021-09-22 ENCOUNTER — Other Ambulatory Visit: Payer: Self-pay

## 2021-09-22 ENCOUNTER — Ambulatory Visit
Admission: RE | Admit: 2021-09-22 | Discharge: 2021-09-22 | Disposition: A | Discharge status: Home / Self Care | Payer: Commercial Managed Care - PPO | Source: Ambulatory Visit | Attending: Radiation Oncology | Admitting: Radiation Oncology

## 2021-09-22 DIAGNOSIS — Z5111 Encounter for antineoplastic chemotherapy: Secondary | ICD-10-CM | POA: Diagnosis not present

## 2021-09-22 LAB — RAD ONC ARIA SESSION SUMMARY
Course Elapsed Days: 5
Plan Fractions Treated to Date: 4
Plan Prescribed Dose Per Fraction: 2 Gy
Plan Total Fractions Prescribed: 35
Plan Total Prescribed Dose: 70 Gy
Reference Point Dosage Given to Date: 8 Gy
Reference Point Session Dosage Given: 2 Gy
Session Number: 4

## 2021-09-23 ENCOUNTER — Other Ambulatory Visit: Payer: Self-pay

## 2021-09-23 ENCOUNTER — Other Ambulatory Visit: Payer: Self-pay | Admitting: Radiation Oncology

## 2021-09-23 ENCOUNTER — Ambulatory Visit
Admission: RE | Admit: 2021-09-23 | Discharge: 2021-09-23 | Disposition: A | Discharge status: Home / Self Care | Payer: Commercial Managed Care - PPO | Source: Ambulatory Visit | Attending: Radiation Oncology | Admitting: Radiation Oncology

## 2021-09-23 ENCOUNTER — Encounter: Payer: Self-pay | Admitting: Hematology and Oncology

## 2021-09-23 ENCOUNTER — Inpatient Hospital Stay (HOSPITAL_BASED_OUTPATIENT_CLINIC_OR_DEPARTMENT_OTHER): Payer: Commercial Managed Care - PPO | Admitting: Hematology and Oncology

## 2021-09-23 ENCOUNTER — Inpatient Hospital Stay: Payer: Commercial Managed Care - PPO

## 2021-09-23 VITALS — BP 138/90 | HR 110 | Temp 98.2°F | Resp 18 | Ht 72.0 in | Wt 156.6 lb

## 2021-09-23 DIAGNOSIS — C113 Malignant neoplasm of anterior wall of nasopharynx: Secondary | ICD-10-CM

## 2021-09-23 DIAGNOSIS — G893 Neoplasm related pain (acute) (chronic): Secondary | ICD-10-CM

## 2021-09-23 DIAGNOSIS — Z5111 Encounter for antineoplastic chemotherapy: Secondary | ICD-10-CM | POA: Diagnosis not present

## 2021-09-23 DIAGNOSIS — T451X5A Adverse effect of antineoplastic and immunosuppressive drugs, initial encounter: Secondary | ICD-10-CM | POA: Insufficient documentation

## 2021-09-23 DIAGNOSIS — C090 Malignant neoplasm of tonsillar fossa: Secondary | ICD-10-CM

## 2021-09-23 DIAGNOSIS — Z87891 Personal history of nicotine dependence: Secondary | ICD-10-CM

## 2021-09-23 DIAGNOSIS — R112 Nausea with vomiting, unspecified: Secondary | ICD-10-CM

## 2021-09-23 DIAGNOSIS — Z800 Family history of malignant neoplasm of digestive organs: Secondary | ICD-10-CM

## 2021-09-23 DIAGNOSIS — Z95828 Presence of other vascular implants and grafts: Secondary | ICD-10-CM

## 2021-09-23 DIAGNOSIS — R634 Abnormal weight loss: Secondary | ICD-10-CM | POA: Diagnosis not present

## 2021-09-23 LAB — RAD ONC ARIA SESSION SUMMARY
Course Elapsed Days: 6
Plan Fractions Treated to Date: 5
Plan Prescribed Dose Per Fraction: 2 Gy
Plan Total Fractions Prescribed: 35
Plan Total Prescribed Dose: 70 Gy
Reference Point Dosage Given to Date: 10 Gy
Reference Point Session Dosage Given: 2 Gy
Session Number: 5

## 2021-09-23 LAB — CBC WITH DIFFERENTIAL (CANCER CENTER ONLY)
Abs Immature Granulocytes: 0.07 10*3/uL (ref 0.00–0.07)
Basophils Absolute: 0 10*3/uL (ref 0.0–0.1)
Basophils Relative: 0 %
Eosinophils Absolute: 0.1 10*3/uL (ref 0.0–0.5)
Eosinophils Relative: 1 %
HCT: 40.6 % (ref 39.0–52.0)
Hemoglobin: 14 g/dL (ref 13.0–17.0)
Immature Granulocytes: 1 %
Lymphocytes Relative: 11 %
Lymphs Abs: 1.3 10*3/uL (ref 0.7–4.0)
MCH: 30.5 pg (ref 26.0–34.0)
MCHC: 34.5 g/dL (ref 30.0–36.0)
MCV: 88.5 fL (ref 80.0–100.0)
Monocytes Absolute: 0.6 10*3/uL (ref 0.1–1.0)
Monocytes Relative: 5 %
Neutro Abs: 9.6 10*3/uL — ABNORMAL HIGH (ref 1.7–7.7)
Neutrophils Relative %: 82 %
Platelet Count: 331 10*3/uL (ref 150–400)
RBC: 4.59 MIL/uL (ref 4.22–5.81)
RDW: 11.9 % (ref 11.5–15.5)
WBC Count: 11.8 10*3/uL — ABNORMAL HIGH (ref 4.0–10.5)
nRBC: 0 % (ref 0.0–0.2)

## 2021-09-23 LAB — BASIC METABOLIC PANEL - CANCER CENTER ONLY
Anion gap: 8 (ref 5–15)
BUN: 16 mg/dL (ref 6–20)
CO2: 28 mmol/L (ref 22–32)
Calcium: 9.5 mg/dL (ref 8.9–10.3)
Chloride: 99 mmol/L (ref 98–111)
Creatinine: 0.79 mg/dL (ref 0.61–1.24)
GFR, Estimated: >60 mL/min (ref >60)
Glucose, Bld: 154 mg/dL — ABNORMAL HIGH (ref 70–99)
Potassium: 3.6 mmol/L (ref 3.5–5.1)
Sodium: 135 mmol/L (ref 135–145)

## 2021-09-23 LAB — MAGNESIUM: Magnesium: 2.1 mg/dL (ref 1.7–2.4)

## 2021-09-23 MED ORDER — HEPARIN SOD (PORK) LOCK FLUSH 100 UNIT/ML IV SOLN
500.0000 [IU] | Freq: Once | INTRAVENOUS | Status: AC
  Administered 2021-09-23: 500 [IU]

## 2021-09-23 MED ORDER — SODIUM CHLORIDE 0.9% FLUSH
10.0000 mL | Freq: Once | INTRAVENOUS | Status: AC
  Administered 2021-09-23: 10 mL

## 2021-09-23 MED ORDER — SONAFINE EX EMUL
1.0000 "application " | Freq: Two times a day (BID) | CUTANEOUS | Status: DC
  Administered 2021-09-23: 1 via TOPICAL

## 2021-09-23 MED ORDER — LIDOCAINE VISCOUS HCL 2 % MT SOLN: OROMUCOSAL | 3 refills | Status: DC

## 2021-09-23 NOTE — Assessment & Plan Note (Signed)
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  ?He is here before C2D1 of chemotherapy. ?He tolerated first cycle of chemotherapy well except for some nausea and fatigue.  Okay to proceed with planned cycle 2-day 1 of chemotherapy tomorrow if labs are all within parameters ?Return to clinic in 1 week ?

## 2021-09-23 NOTE — Progress Notes (Signed)
Johnny Mata ?CONSULT NOTE ? ?Patient Care Team: ?Patient, No Pcp Per (Inactive) as PCP - General (General Practice) ?Atilano Ina, MD as Referring Physician (Neurosurgery) ?Malmfelt, Stephani Police, RN as Oncology Nurse Navigator ?Eppie Gibson, MD as Consulting Physician (Radiation Oncology) ?Benay Pike, MD as Consulting Physician (Hematology and Oncology) ? ?CHIEF COMPLAINTS/PURPOSE OF CONSULTATION:  ?SCC oropharynx ? ?ASSESSMENT & PLAN:  ? 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  ?He is here before C2D1 of chemotherapy. ?He tolerated first cycle of chemotherapy well except for some nausea and fatigue.  Okay to proceed with planned cycle 2-day 1 of chemotherapy tomorrow if labs are all within parameters ?Return to clinic in 1 week ? ?Cancer related pain ?Severe pain in the right neck radiating to the right ear.  He has been taking oxycodone 1 tablet every 6 hours.  New prescription has been refilled last Friday.  He has not picked this up yet.  We will continue to monitor his pain. ? ?Weight loss, unintentional ?He continues to lose weight.,  He has lost almost 10 to 15 pounds so far.  Encouraged him to start using the G-tube. ? ?Chemotherapy induced nausea and vomiting ?Grade 1, continue to use Zofran and Compazine as needed ? ? ? ?No orders of the defined types were placed in this encounter. ? ? ? ?HISTORY OF PRESENTING ILLNESS:  ?Johnny Mata 46 y.o. 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 ? ?Oncology History  ?Primary cancer of tonsillar fossa ([place])  ?08/19/2021 Imaging  ? Patient complained of spitting chunks of tissue hence had a CT maxillofacial with contrast as well as with the neck with contrast.  Mucosal hyperenhancement in the right nasopharynx extending to the oropharynx with  mostly soft tissue density in the region of right palatine tonsil most suspicious for malignancy.  There is also a 1.8 x 1.5 x 2.4 cm peripherally enhancing hyperintense lesion in the right retropharynx suspicious for necrotic lymph node with additional prominent right level 2 lymph nodes measuring up to 1.3 cm in short axis with suspected extracapsular extension at level to me. ?  ?08/27/2021 Initial Diagnosis  ? Primary cancer of tonsillar fossa ([place]) ? ?  ?09/01/2021 PET scan  ? PET scan from April 17 showed locally advanced right palatine tonsil primary with ipsilateral cervical nodal metastasis no evidence of hypermetabolic extracervical metastasis.  Tiny pulmonary nodules below PET resolution consider chest CT follow-up at 6 months ?  ?09/03/2021 Cancer Staging  ? Staging form: Pharynx - P16 Negative Oropharynx, AJCC 8th Edition ?- Clinical stage from 09/03/2021: Stage IVA (cT3, cN2b, cM0, p16-) - Signed by Eppie Gibson, MD on 09/03/2021 ?Stage prefix: Initial diagnosis ? ?  ?09/17/2021 -  Chemotherapy  ? Patient is on Treatment Plan : HEAD/NECK Cisplatin q7d  ? ?  ?  ? ?He started cycle 1 on 09/17/2021.  Cycle 2 due tomorrow.  He says chemo was easy, just felt tired for about a week.  However last night he had a lot of nausea and vomiting has taken several nausea medications.  Today he feels better.  He feels that the pain in the tumor is more aggravated and he attributes it to treatment which may have made the tumor angry.  He has been taking 10 mg of oxycodone every 6 hours.  He has also lost a  substantial amount of weight although he tells me that this is normal for him.  He has not been using the G-tube.  He is telling me that he [place] down and he is able to swallow most of the foods.  The pain is 10 out of 10 and radiates around the ear and is sporadic. ?Rest of the pertinent 10 point ROS reviewed and negative ? ?MEDICAL HISTORY:  ?Past Medical History:  ?Diagnosis Date  ? Anxiety   ? Never officially  diagnosed; does not take medication for anxiety  ? Asthma   ? Hasn't had an episode since childhood  ? Depression   ? ? ?SURGICAL HISTORY: ?Past Surgical History:  ?Procedure Laterality Date  ? ELBOW SURGERY Bilateral   ? HIP SURGERY Left   ? IR GASTROSTOMY TUBE MOD SED  09/11/2021  ? IR IMAGING GUIDED PORT INSERTION  09/11/2021  ? NECK SURGERY  05/09/2018  ? Cervical fusion (Dr. Atilano Ina)  ? PANENDOSCOPY N/A 08/19/2021  ? Procedure: PANENDOSCOPY WITH BIPOSY--direct laryngoscopy, flexible bronchoscopy, rigid esophagoscopy, biopsy of lesion in throat;  Surgeon: Marcina Millard, MD;  Location: [facility];  Service: ENT;  Laterality: N/A;  ? PLANTAR FASCIA SURGERY    ? SHOULDER SURGERY Left   ? TONSILLECTOMY    ? [place] EXTRACTION Bilateral 1995  ? ? ?SOCIAL HISTORY: ?Social History  ? ?Socioeconomic History  ? Marital status: Single  ?  Spouse name: Not on file  ? Number of children: Not on file  ? Years of education: Not on file  ? Highest education level: Not on file  ?Occupational History  ? Not on file  ?Tobacco Use  ? Smoking status: Former  ?  Packs/day: 1.50  ?  Years: 20.00  ?  Pack years: 30.00  ?  Types: Cigarettes  ?  Quit date: 10/02/2019  ?  Years since quitting: 1.9  ? Smokeless tobacco: Never  ?Vaping Use  ? Vaping Use: Never used  ?Substance and Sexual Activity  ? Alcohol use: Not Currently  ?  Alcohol/week: 7.0 standard drinks  ?  Types: 7 Shots of liquor per week  ? Drug use: Yes  ?  Types: Marijuana  ? Sexual activity: Not Currently  ?Other Topics Concern  ? Not on file  ?Social History Narrative  ? Not on file  ? ?Social Determinants of Health  ? ?Financial Resource Strain: Medium Risk  ? Difficulty of Paying Living Expenses: Somewhat hard  ?Food Insecurity: Not on file  ?Transportation Needs: No Transportation Needs  ? Lack of Transportation (Medical): No  ? Lack of Transportation (Non-Medical): No  ?Physical Activity: Not on file  ?Stress: Not on file  ?Social Connections: Not on file   ?Intimate Partner Violence: Not on file  ? ? ?FAMILY HISTORY: ?Family History  ?Problem Relation Age of Onset  ? Colon cancer Mother   ? ? ?ALLERGIES:  is allergic to dust mite extract, other, and pollen extract-tree extract [pollen extract]. ? ?MEDICATIONS:  ?Current Outpatient Medications  ?Medication Sig Dispense Refill  ? dexamethasone (DECADRON) 4 MG tablet Take 2 tablets (8 mg total) by mouth daily. Take daily x 3 days starting the day after cisplatin chemotherapy. Take with food. 30 tablet 1  ? diphenhydrAMINE (BENADRYL) 25 mg capsule Take 1 capsule (25 mg total) by mouth daily as needed for sleep. 30 capsule 0  ? HYDROcodone-acetaminophen (NORCO) 10-325 MG tablet Take 0.5-1 tablets by mouth every 6 (six) hours as needed. 60 tablet 0  ?  ibuprofen (ADVIL) 200 MG tablet Take 400 mg by mouth See admin instructions. Pt states they are taking 400 mg every 2-3 hours as needed.    ? Ibuprofen-diphenhydrAMINE Cit (IBUPROFEN PM PO) Take 2 tablets by mouth at bedtime.    ? lidocaine-prilocaine (EMLA) cream Apply to affected area once 30 g 3  ? ondansetron (ZOFRAN) 8 MG tablet Take 1 tablet (8 mg total) by mouth 2 (two) times daily as needed. Start on the third day after cisplatin chemotherapy. 30 tablet 1  ? prochlorperazine (COMPAZINE) 10 MG tablet Take 1 tablet (10 mg total) by mouth every 6 (six) hours as needed (Nausea or vomiting). 30 tablet 1  ? sertraline (ZOLOFT) 100 MG tablet Take 100 mg by mouth daily.    ? traZODone (DESYREL) 50 MG tablet Take 50 mg by mouth at bedtime. Able to take 50-'200mg'$  as needed for sleep    ? ?No current facility-administered medications for this visit.  ? ?Facility-Administered Medications Ordered in Other Visits  ?Medication Dose Route Frequency Provider Last Rate Last Admin  ? Sonafine emulsion 1 application.  1 application. Topical BID Eppie Gibson, MD   1 application. at 09/23/21 1615  ? ? ? ?PHYSICAL EXAMINATION: ?ECOG PERFORMANCE STATUS: 0 - Asymptomatic ? ?Vitals:  ?  09/23/21 1521  ?BP: 138/90  ?Pulse: (!) 110  ?Resp: 18  ?Temp: 98.2 ?F (36.8 ?C)  ?SpO2: 98%  ? ? ?Filed Weights  ? 09/23/21 1521  ?Weight: 156 lb 9.6 oz (71 kg)  ? ?GENERAL:alert, no distress and comfortable ?

## 2021-09-23 NOTE — Assessment & Plan Note (Signed)
Severe pain in the right neck radiating to the right ear.  He has been taking oxycodone 1 tablet every 6 hours.  New prescription has been refilled last Friday.  He has not picked this up yet.  We will continue to monitor his pain. ?

## 2021-09-23 NOTE — Assessment & Plan Note (Signed)
He continues to lose weight.,  He has lost almost 10 to 15 pounds so far.  Encouraged him to start using the G-tube. ?

## 2021-09-23 NOTE — Progress Notes (Signed)
Pt here for patient teaching.   ? ?Pt given Radiation and You booklet, Managing Acute Radiation Side Effects for Head and Neck Cancer handout, skin care instructions, and Sonafine.   ? ?Reviewed areas of pertinence such as fatigue, hair loss in treatment field, mouth changes, skin changes, throat changes, earaches, and taste changes .  ? ?Pt able to give teach back of to pat skin, use unscented/gentle soap, and drink plenty of water,apply Sonafine bid, avoid applying anything to skin within 4 hours of treatment, and to use an electric razor if they must shave.  ? ?Pt demonstrated understanding and verbalizes understanding of information given and will contact nursing with any questions or concerns.   ? ?[email or web address]/whattoexpect/index ?  ? ? ? ? ? ? ?

## 2021-09-23 NOTE — Assessment & Plan Note (Signed)
Grade 1, continue to use Zofran and Compazine as needed ?

## 2021-09-24 ENCOUNTER — Other Ambulatory Visit: Payer: Self-pay

## 2021-09-24 ENCOUNTER — Inpatient Hospital Stay: Payer: Commercial Managed Care - PPO | Admitting: Dietician

## 2021-09-24 ENCOUNTER — Inpatient Hospital Stay: Payer: Commercial Managed Care - PPO

## 2021-09-24 ENCOUNTER — Ambulatory Visit
Admission: RE | Admit: 2021-09-24 | Discharge: 2021-09-24 | Disposition: A | Discharge status: Home / Self Care | Payer: Commercial Managed Care - PPO | Source: Ambulatory Visit | Attending: Radiation Oncology | Admitting: Radiation Oncology

## 2021-09-24 VITALS — BP 134/93 | HR 81 | Temp 98.8°F | Resp 17

## 2021-09-24 DIAGNOSIS — Z5111 Encounter for antineoplastic chemotherapy: Secondary | ICD-10-CM | POA: Diagnosis not present

## 2021-09-24 DIAGNOSIS — C090 Malignant neoplasm of tonsillar fossa: Secondary | ICD-10-CM

## 2021-09-24 LAB — RAD ONC ARIA SESSION SUMMARY
Course Elapsed Days: 7
Plan Fractions Treated to Date: 6
Plan Prescribed Dose Per Fraction: 2 Gy
Plan Total Fractions Prescribed: 35
Plan Total Prescribed Dose: 70 Gy
Reference Point Dosage Given to Date: 12 Gy
Reference Point Session Dosage Given: 2 Gy
Session Number: 6

## 2021-09-24 MED ORDER — SODIUM CHLORIDE 0.9 % IV SOLN
40.0000 mg/m2 | Freq: Once | INTRAVENOUS | Status: AC
  Administered 2021-09-24: 78 mg via INTRAVENOUS
  Filled 2021-09-24: qty 78

## 2021-09-24 MED ORDER — HEPARIN SOD (PORK) LOCK FLUSH 100 UNIT/ML IV SOLN
500.0000 [IU] | Freq: Once | INTRAVENOUS | Status: AC | PRN
  Administered 2021-09-24: 500 [IU]

## 2021-09-24 MED ORDER — SODIUM CHLORIDE 0.9% FLUSH
10.0000 mL | INTRAVENOUS | Status: DC | PRN
  Administered 2021-09-24: 10 mL

## 2021-09-24 MED ORDER — POTASSIUM CHLORIDE IN NACL 20-0.9 MEQ/L-% IV SOLN
Freq: Once | INTRAVENOUS | Status: AC
  Filled 2021-09-24: qty 1000

## 2021-09-24 MED ORDER — SODIUM CHLORIDE 0.9 % IV SOLN: Freq: Once | INTRAVENOUS | Status: AC

## 2021-09-24 MED ORDER — SODIUM CHLORIDE 0.9 % IV SOLN
10.0000 mg | Freq: Once | INTRAVENOUS | Status: AC
  Administered 2021-09-24: 10 mg via INTRAVENOUS
  Filled 2021-09-24: qty 10

## 2021-09-24 MED ORDER — MAGNESIUM SULFATE 2 GM/50ML IV SOLN
2.0000 g | Freq: Once | INTRAVENOUS | Status: AC
  Administered 2021-09-24: 2 g via INTRAVENOUS
  Filled 2021-09-24: qty 50

## 2021-09-24 MED ORDER — PALONOSETRON HCL INJECTION 0.25 MG/5ML
0.2500 mg | Freq: Once | INTRAVENOUS | Status: AC
  Administered 2021-09-24: 0.25 mg via INTRAVENOUS
  Filled 2021-09-24: qty 5

## 2021-09-24 MED ORDER — SODIUM CHLORIDE 0.9 % IV SOLN
150.0000 mg | Freq: Once | INTRAVENOUS | Status: AC
  Administered 2021-09-24: 150 mg via INTRAVENOUS
  Filled 2021-09-24: qty 150

## 2021-09-24 NOTE — Progress Notes (Signed)
Nutrition Follow-up: ? 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  ? ?Patient in infusion bed laying on his side this morning. Patient reports he "is miserable" He complains of pain to face and throat. Patient reports he has not been able to sleep and is exhausted. He recalls last eating at 4 AM but unable to recall what he had. Patient is flushing his tube daily. He has not tried bolus feeding. Patient agreeable to completing feeding during infusion today. He requested RD to come back later to do this.  ? ?RD returned later this afternoon. Patient agreeable to giving feeding. Patient reports he does not enjoy the act of eating, but typically eats 2 meals day. Patient recalls burger, fries, chicken nuggets, baked potato yesterday. He is drinking water all day long. Patient points to 24 ounce plastic cup that he never lets get empty. Patient reports his neck and throat are painful, but manageable. Patient reports he will likely not feel like eating tomorrow. He is agreeable to use feeding tube. Patient successfully demonstrated bolus feeding with sample carton of Nutren 1.5.  ? ?Medications: Norco, Decadron, Compazine, Zofran, Zoloft, Xylocaine ? ?Labs: glucose 154 ? ?Anthropometrics: Last weight 156 lb 9.6 oz on 5/9 decreased  ? ?5/3 - 163 lb 4 oz  ?5/2 - 159 lb 1.6 oz ?4/21 - 167 lb 6 oz  ?4/4 - 173 lb  ? ?Patient endorses 10-15 lb wt fluctuations within a week or two are normal for him ? ?Estimated Energy Needs ? ?Kcals: 4174-0814 ?Protein: 120-134 ?Fluid: 2.6 L ? ?NUTRITION DIAGNOSIS: Inadequate oral intake ongoing ? ? ?INTERVENTION:  ?Patient able to successfully complete bolus feeding with 60 ml water flush before and after ?He is agreeable to start with 2 cartons/day and will increase with decreased oral intake/weight decline ?Patient has complimentary case Osmolite 1.5 and supplies provided at [facility] teaching. He reports no additional needs at this  time ?Recommend pt start baking soda salt water rinses several times daily ? ?Will plan to order [facility] 1.4 ?-6 cartons via tube split over four feedings. Flush tube with 90 ml water before and after feedings. Drink by mouth or give via tube additional 2 cups water. This provides 2730 kcal, 120 grams protein, 1404 ml free water (2598 ml total water with flushes). Meets 100% needs  ?  ? ?MONITORING, EVALUATION, GOAL: weight trends, intake, tube feeding ? ? ?NEXT VISIT: Wednesday May 17 during infusion  ? ? ? ?

## 2021-09-25 ENCOUNTER — Ambulatory Visit
Admission: RE | Admit: 2021-09-25 | Discharge: 2021-09-25 | Disposition: A | Discharge status: Home / Self Care | Payer: Commercial Managed Care - PPO | Source: Ambulatory Visit | Attending: Radiation Oncology | Admitting: Radiation Oncology

## 2021-09-25 ENCOUNTER — Other Ambulatory Visit: Payer: Self-pay

## 2021-09-25 DIAGNOSIS — Z5111 Encounter for antineoplastic chemotherapy: Secondary | ICD-10-CM | POA: Diagnosis not present

## 2021-09-25 LAB — RAD ONC ARIA SESSION SUMMARY
Course Elapsed Days: 8
Plan Fractions Treated to Date: 7
Plan Prescribed Dose Per Fraction: 2 Gy
Plan Total Fractions Prescribed: 35
Plan Total Prescribed Dose: 70 Gy
Reference Point Dosage Given to Date: 14 Gy
Reference Point Session Dosage Given: 2 Gy
Session Number: 7

## 2021-09-26 ENCOUNTER — Other Ambulatory Visit: Payer: Self-pay

## 2021-09-26 ENCOUNTER — Ambulatory Visit
Admission: RE | Admit: 2021-09-26 | Discharge: 2021-09-26 | Disposition: A | Discharge status: Home / Self Care | Payer: Commercial Managed Care - PPO | Source: Ambulatory Visit | Attending: Radiation Oncology | Admitting: Radiation Oncology

## 2021-09-26 DIAGNOSIS — Z5111 Encounter for antineoplastic chemotherapy: Secondary | ICD-10-CM | POA: Diagnosis not present

## 2021-09-26 LAB — RAD ONC ARIA SESSION SUMMARY
Course Elapsed Days: 9
Plan Fractions Treated to Date: 8
Plan Prescribed Dose Per Fraction: 2 Gy
Plan Total Fractions Prescribed: 35
Plan Total Prescribed Dose: 70 Gy
Reference Point Dosage Given to Date: 16 Gy
Reference Point Session Dosage Given: 2 Gy
Session Number: 8

## 2021-09-29 ENCOUNTER — Other Ambulatory Visit: Payer: Self-pay

## 2021-09-29 ENCOUNTER — Inpatient Hospital Stay: Payer: Commercial Managed Care - PPO

## 2021-09-29 ENCOUNTER — Ambulatory Visit
Admission: RE | Admit: 2021-09-29 | Discharge: 2021-09-29 | Disposition: A | Discharge status: Home / Self Care | Payer: Commercial Managed Care - PPO | Source: Ambulatory Visit | Attending: Radiation Oncology | Admitting: Radiation Oncology

## 2021-09-29 DIAGNOSIS — Z95828 Presence of other vascular implants and grafts: Secondary | ICD-10-CM

## 2021-09-29 DIAGNOSIS — Z5111 Encounter for antineoplastic chemotherapy: Secondary | ICD-10-CM | POA: Diagnosis not present

## 2021-09-29 DIAGNOSIS — C090 Malignant neoplasm of tonsillar fossa: Secondary | ICD-10-CM

## 2021-09-29 LAB — BASIC METABOLIC PANEL - CANCER CENTER ONLY
Anion gap: 6 (ref 5–15)
BUN: 21 mg/dL — ABNORMAL HIGH (ref 6–20)
CO2: 29 mmol/L (ref 22–32)
Calcium: 8.6 mg/dL — ABNORMAL LOW (ref 8.9–10.3)
Chloride: 98 mmol/L (ref 98–111)
Creatinine: 0.68 mg/dL (ref 0.61–1.24)
GFR, Estimated: >60 mL/min (ref >60)
Glucose, Bld: 81 mg/dL (ref 70–99)
Potassium: 4.1 mmol/L (ref 3.5–5.1)
Sodium: 133 mmol/L — ABNORMAL LOW (ref 135–145)

## 2021-09-29 LAB — RAD ONC ARIA SESSION SUMMARY
Course Elapsed Days: 12
Plan Fractions Treated to Date: 9
Plan Prescribed Dose Per Fraction: 2 Gy
Plan Total Fractions Prescribed: 35
Plan Total Prescribed Dose: 70 Gy
Reference Point Dosage Given to Date: 18 Gy
Reference Point Session Dosage Given: 2 Gy
Session Number: 9

## 2021-09-29 LAB — CBC WITH DIFFERENTIAL (CANCER CENTER ONLY)
Abs Immature Granulocytes: 0.04 10*3/uL (ref 0.00–0.07)
Basophils Absolute: 0 10*3/uL (ref 0.0–0.1)
Basophils Relative: 0 %
Eosinophils Absolute: 0.1 10*3/uL (ref 0.0–0.5)
Eosinophils Relative: 1 %
HCT: 31.6 % — ABNORMAL LOW (ref 39.0–52.0)
Hemoglobin: 11.2 g/dL — ABNORMAL LOW (ref 13.0–17.0)
Immature Granulocytes: 0 %
Lymphocytes Relative: 10 %
Lymphs Abs: 1.1 10*3/uL (ref 0.7–4.0)
MCH: 31.5 pg (ref 26.0–34.0)
MCHC: 35.4 g/dL (ref 30.0–36.0)
MCV: 89 fL (ref 80.0–100.0)
Monocytes Absolute: 0.8 10*3/uL (ref 0.1–1.0)
Monocytes Relative: 8 %
Neutro Abs: 8.8 10*3/uL — ABNORMAL HIGH (ref 1.7–7.7)
Neutrophils Relative %: 81 %
Platelet Count: 221 10*3/uL (ref 150–400)
RBC: 3.55 MIL/uL — ABNORMAL LOW (ref 4.22–5.81)
RDW: 11.9 % (ref 11.5–15.5)
WBC Count: 10.8 10*3/uL — ABNORMAL HIGH (ref 4.0–10.5)
nRBC: 0 % (ref 0.0–0.2)

## 2021-09-29 LAB — MAGNESIUM: Magnesium: 2.1 mg/dL (ref 1.7–2.4)

## 2021-09-29 MED ORDER — SODIUM CHLORIDE 0.9% FLUSH
10.0000 mL | Freq: Once | INTRAVENOUS | Status: AC
  Administered 2021-09-29: 10 mL

## 2021-09-29 MED ORDER — HEPARIN SOD (PORK) LOCK FLUSH 100 UNIT/ML IV SOLN
500.0000 [IU] | Freq: Once | INTRAVENOUS | Status: AC
  Administered 2021-09-29: 500 [IU]

## 2021-09-30 ENCOUNTER — Other Ambulatory Visit: Payer: Self-pay

## 2021-09-30 ENCOUNTER — Inpatient Hospital Stay (HOSPITAL_BASED_OUTPATIENT_CLINIC_OR_DEPARTMENT_OTHER): Payer: Commercial Managed Care - PPO | Admitting: Hematology and Oncology

## 2021-09-30 ENCOUNTER — Encounter: Payer: Self-pay | Admitting: Hematology and Oncology

## 2021-09-30 ENCOUNTER — Ambulatory Visit
Admission: RE | Admit: 2021-09-30 | Discharge: 2021-09-30 | Disposition: A | Discharge status: Home / Self Care | Payer: Commercial Managed Care - PPO | Source: Ambulatory Visit | Attending: Radiation Oncology | Admitting: Radiation Oncology

## 2021-09-30 DIAGNOSIS — G893 Neoplasm related pain (acute) (chronic): Secondary | ICD-10-CM | POA: Diagnosis not present

## 2021-09-30 DIAGNOSIS — C090 Malignant neoplasm of tonsillar fossa: Secondary | ICD-10-CM | POA: Diagnosis not present

## 2021-09-30 DIAGNOSIS — R634 Abnormal weight loss: Secondary | ICD-10-CM | POA: Diagnosis not present

## 2021-09-30 DIAGNOSIS — Z5111 Encounter for antineoplastic chemotherapy: Secondary | ICD-10-CM | POA: Diagnosis not present

## 2021-09-30 LAB — RAD ONC ARIA SESSION SUMMARY
Course Elapsed Days: 13
Plan Fractions Treated to Date: 10
Plan Prescribed Dose Per Fraction: 2 Gy
Plan Total Fractions Prescribed: 35
Plan Total Prescribed Dose: 70 Gy
Reference Point Dosage Given to Date: 20 Gy
Reference Point Session Dosage Given: 2 Gy
Session Number: 10

## 2021-09-30 MED FILL — Dexamethasone Sodium Phosphate Inj 100 MG/10ML: INTRAMUSCULAR | Qty: 1 | Status: AC

## 2021-09-30 MED FILL — Fosaprepitant Dimeglumine For IV Infusion 150 MG (Base Eq): INTRAVENOUS | Qty: 5 | Status: AC

## 2021-09-30 NOTE — Assessment & Plan Note (Signed)
Severe pain in the right neck radiating to the right ear.  He has been taking oxycodone 1 tablet every 6 hours.  His pain is very well controlled on this regimen. ?

## 2021-09-30 NOTE — Assessment & Plan Note (Addendum)
He continues to lose weight although he says his weight has remained stable.  He is still able to tolerate oral feeding.  He understands to use the G-tube if he is unable to eat or drink. ?

## 2021-09-30 NOTE — Progress Notes (Signed)
[place] ?CONSULT NOTE ? ?Patient Care Team: ?Patient, No Pcp Per (Inactive) as PCP - General (General Practice) ?Atilano Ina, MD as Referring Physician (Neurosurgery) ?Malmfelt, Stephani Police, RN as Oncology Nurse Navigator ?Eppie Gibson, MD as Consulting Physician (Radiation Oncology) ?Benay Pike, MD as Consulting Physician (Hematology and Oncology) ? ?CHIEF COMPLAINTS/PURPOSE OF CONSULTATION:  ?SCC oropharynx ? ?ASSESSMENT & PLAN:  ? 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  ?He is here before C2D1 of chemotherapy. ?He tolerated first cycle of chemotherapy well except for some nausea and fatigue.   ?He complains of severe tinnitus after second cycle of chemotherapy. 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  We will dose reduce cisplatin to 30 mg per metered square and see if this is better tolerated.  He understands that the intent of treatment is curative. ?He understands the risk of ototoxicity from cisplatin. ?He has had great clinical response so far, significantly diminished cervical lymphadenopathy. ?Return to clinic in 1 week ? ?Cancer related pain ?Severe pain in the right neck radiating to the right ear.  He has been taking oxycodone 1 tablet every 6 hours.  His pain is very well controlled on this regimen. ? ?Weight loss, unintentional ?He continues to lose weight although he says his weight has remained stable.  He is still able to tolerate oral feeding.  He understands to use the G-tube if he is unable to eat or drink. ? ? ? ? ?No orders of the defined types were placed in this encounter. ? ? ? ?HISTORY OF PRESENTING ILLNESS:  ?Johnny Mata  46 y.o. 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 ? ?Oncology History  ?Primary cancer of tonsillar fossa ([place])  ?08/19/2021 Imaging  ? Patient complained of spitting chunks of tissue hence had a CT maxillofacial with contrast as well as with the neck with contrast.  Mucosal hyperenhancement in the right nasopharynx extending to the oropharynx with mostly soft tissue density in the region of right palatine tonsil most suspicious for malignancy.  There is also a 1.8 x 1.5 x 2.4 cm peripherally enhancing hyperintense lesion in the right retropharynx suspicious for necrotic lymph node with additional prominent right level 2 lymph nodes measuring up to 1.3 cm in short axis with suspected extracapsular extension at level to me. ?  ?08/27/2021 Initial Diagnosis  ? Primary cancer of tonsillar fossa ([place]) ? ?  ?09/01/2021 PET scan  ? PET scan from April 17 showed locally advanced right palatine tonsil primary with ipsilateral cervical nodal metastasis no evidence of hypermetabolic extracervical metastasis.  Tiny pulmonary nodules below PET resolution consider chest CT follow-up at 6 months ?  ?09/03/2021 Cancer Staging  ? Staging form: Pharynx - P16 Negative Oropharynx, AJCC 8th Edition ?- Clinical stage from 09/03/2021: Stage IVA (cT3, cN2b, cM0, p16-) - Signed by Eppie Gibson, MD on 09/03/2021 ?Stage prefix: Initial diagnosis ? ?  ?09/17/2021 -  Chemotherapy  ? Patient is on Treatment Plan : HEAD/NECK Cisplatin q7d  ? ?   ? ?He started cycle 1 on 09/17/2021.   ?C2D1 cisplatin chemotherapy 09/24/2021 ? ?Interval History ? ?He is having random episodes of tinnitus for the past week. It will change ears, it can be different frequencies, right now  he feels like he is under water. ?Pain is reasonably well controlled, taking pain med every 6 hrs, currently rated at 3/10 ?He is still able to swallow everything, weight is stable ?No nausea , vomiting, diarrhea ?No neuropathy. ?Rest of the pertinent 10 point ROS reviewed and  negative ? ?MEDICAL HISTORY:  ?Past Medical History:  ?Diagnosis Date  ? Anxiety   ? Never officially diagnosed; does not take medication for anxiety  ? Asthma   ? Hasn't had an episode since childhood  ? Depression   ? ? ?SURGICAL HISTORY: ?Past Surgical History:  ?Procedure Laterality Date  ? ELBOW SURGERY Bilateral   ? HIP SURGERY Left   ? IR GASTROSTOMY TUBE MOD SED  09/11/2021  ? IR IMAGING GUIDED PORT INSERTION  09/11/2021  ? NECK SURGERY  05/09/2018  ? Cervical fusion (Dr. Atilano Ina)  ? PANENDOSCOPY N/A 08/19/2021  ? Procedure: PANENDOSCOPY WITH BIPOSY--direct laryngoscopy, flexible bronchoscopy, rigid esophagoscopy, biopsy of lesion in throat;  Surgeon: Marcina Millard, MD;  Location: [facility];  Service: ENT;  Laterality: N/A;  ? PLANTAR FASCIA SURGERY    ? SHOULDER SURGERY Left   ? TONSILLECTOMY    ? [place] EXTRACTION Bilateral 1995  ? ? ?SOCIAL HISTORY: ?Social History  ? ?Socioeconomic History  ? Marital status: Single  ?  Spouse name: Not on file  ? Number of children: Not on file  ? Years of education: Not on file  ? Highest education level: Not on file  ?Occupational History  ? Not on file  ?Tobacco Use  ? Smoking status: Former  ?  Packs/day: 1.50  ?  Years: 20.00  ?  Pack years: 30.00  ?  Types: Cigarettes  ?  Quit date: 10/02/2019  ?  Years since quitting: 1.9  ? Smokeless tobacco: Never  ?Vaping Use  ? Vaping Use: Never used  ?Substance and Sexual Activity  ? Alcohol use: Not Currently  ?  Alcohol/week: 7.0 standard drinks  ?  Types: 7 Shots of liquor per week  ? Drug use: Yes  ?  Types: Marijuana  ? Sexual activity: Not Currently  ?Other Topics Concern  ? Not on file  ?Social History Narrative  ? Not on file  ? ?Social Determinants of Health  ? ?Financial Resource Strain: Medium Risk  ? Difficulty of Paying Living Expenses: Somewhat hard  ?Food Insecurity: Not on file  ?Transportation Needs: No Transportation Needs  ? Lack of Transportation (Medical): No  ? Lack of Transportation  (Non-Medical): No  ?Physical Activity: Not on file  ?Stress: Not on file  ?Social Connections: Not on file  ?Intimate Partner Violence: Not on file  ? ? ?FAMILY HISTORY: ?Family History  ?Problem Relation Age of Onset  ? Colon cancer Mother   ? ? ?ALLERGIES:  is allergic to dust mite extract, other, and pollen extract-tree extract [pollen extract]. ? ?MEDICATIONS:  ?Current Outpatient Medications  ?Medication Sig Dispense Refill  ? dexamethasone (DECADRON) 4 MG tablet Take 2 tablets (8 mg total) by mouth daily. Take daily x 3 days starting the day after cisplatin chemotherapy. Take with food. 30 tablet 1  ? diphenhydrAMINE (BENADRYL) 25 mg capsule Take 1 capsule (25 mg total) by mouth daily as needed for sleep. 30 capsule 0  ? HYDROcodone-acetaminophen (NORCO) 10-325 MG tablet Take 0.5-1 tablets by mouth every 6 (six) hours as needed. 60 tablet 0  ? ibuprofen (ADVIL) 200 MG tablet Take 400 mg by mouth See admin instructions. Pt states they are taking  400 mg every 2-3 hours as needed.    ? Ibuprofen-diphenhydrAMINE Cit (IBUPROFEN PM PO) Take 2 tablets by mouth at bedtime.    ? lidocaine (XYLOCAINE) 2 % solution Patient: Mix 1part 2% viscous lidocaine, 1part H20. Swish & swallow 49m of diluted mixture, 340m before meals and at bedtime, up to QID 200 mL 3  ? lidocaine-prilocaine (EMLA) cream Apply to affected area once 30 g 3  ? ondansetron (ZOFRAN) 8 MG tablet Take 1 tablet (8 mg total) by mouth 2 (two) times daily as needed. Start on the third day after cisplatin chemotherapy. 30 tablet 1  ? prochlorperazine (COMPAZINE) 10 MG tablet Take 1 tablet (10 mg total) by mouth every 6 (six) hours as needed (Nausea or vomiting). 30 tablet 1  ? sertraline (ZOLOFT) 100 MG tablet Take 100 mg by mouth daily.    ? traZODone (DESYREL) 50 MG tablet Take 50 mg by mouth at bedtime. Able to take 50-'200mg'$  as needed for sleep    ? ?No current facility-administered medications for this visit.  ? ? ? ?PHYSICAL EXAMINATION: ?ECOG  PERFORMANCE STATUS: 0 - Asymptomatic ? ?Vitals:  ? 09/30/21 1447  ?BP: (!) 136/92  ?Pulse: 91  ?Resp: 16  ?Temp: 97.9 ?F (36.6 ?C)  ?SpO2: 95%  ? ? ? ?Filed Weights  ? 09/30/21 1447  ?Weight: 151 lb 8 oz (68.7 kg)  ? ? ?GENERAL:aler

## 2021-09-30 NOTE — Assessment & Plan Note (Addendum)
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  ?He is here before C2D1 of chemotherapy. ?He tolerated first cycle of chemotherapy well except for some nausea and fatigue.   ?He complains of severe tinnitus after second cycle of chemotherapy. 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  We will dose reduce cisplatin to 30 mg per metered square and see if this is better tolerated.  He understands that the intent of treatment is curative. ?He understands the risk of ototoxicity from cisplatin. ?He has had great clinical response so far, significantly diminished cervical lymphadenopathy. ?Return to clinic in 1 week ?

## 2021-10-01 ENCOUNTER — Ambulatory Visit
Admission: RE | Admit: 2021-10-01 | Discharge: 2021-10-01 | Disposition: A | Discharge status: Home / Self Care | Payer: Commercial Managed Care - PPO | Source: Ambulatory Visit | Attending: Radiation Oncology | Admitting: Radiation Oncology

## 2021-10-01 ENCOUNTER — Telehealth (INDEPENDENT_AMBULATORY_CARE_PROVIDER_SITE_OTHER): Payer: Self-pay | Admitting: Otolaryngology

## 2021-10-01 ENCOUNTER — Inpatient Hospital Stay: Payer: Commercial Managed Care - PPO

## 2021-10-01 ENCOUNTER — Inpatient Hospital Stay: Payer: Commercial Managed Care - PPO | Admitting: Dietician

## 2021-10-01 ENCOUNTER — Other Ambulatory Visit: Payer: Self-pay

## 2021-10-01 VITALS — BP 119/85 | HR 99 | Temp 98.9°F | Resp 18

## 2021-10-01 DIAGNOSIS — C090 Malignant neoplasm of tonsillar fossa: Secondary | ICD-10-CM

## 2021-10-01 DIAGNOSIS — Z5111 Encounter for antineoplastic chemotherapy: Secondary | ICD-10-CM | POA: Diagnosis not present

## 2021-10-01 LAB — RAD ONC ARIA SESSION SUMMARY
Course Elapsed Days: 14
Plan Fractions Treated to Date: 11
Plan Prescribed Dose Per Fraction: 2 Gy
Plan Total Fractions Prescribed: 35
Plan Total Prescribed Dose: 70 Gy
Reference Point Dosage Given to Date: 22 Gy
Reference Point Session Dosage Given: 2 Gy
Session Number: 11

## 2021-10-01 MED ORDER — SODIUM CHLORIDE 0.9% FLUSH
10.0000 mL | INTRAVENOUS | Status: DC | PRN
  Administered 2021-10-01: 10 mL

## 2021-10-01 MED ORDER — SODIUM CHLORIDE 0.9 % IV SOLN
10.0000 mg | Freq: Once | INTRAVENOUS | Status: AC
  Administered 2021-10-01: 10 mg via INTRAVENOUS
  Filled 2021-10-01: qty 10

## 2021-10-01 MED ORDER — PALONOSETRON HCL INJECTION 0.25 MG/5ML
0.2500 mg | Freq: Once | INTRAVENOUS | Status: AC
  Administered 2021-10-01: 0.25 mg via INTRAVENOUS
  Filled 2021-10-01: qty 5

## 2021-10-01 MED ORDER — POTASSIUM CHLORIDE IN NACL 20-0.9 MEQ/L-% IV SOLN
Freq: Once | INTRAVENOUS | Status: AC
  Filled 2021-10-01: qty 1000

## 2021-10-01 MED ORDER — MAGNESIUM SULFATE 2 GM/50ML IV SOLN
2.0000 g | Freq: Once | INTRAVENOUS | Status: AC
  Administered 2021-10-01: 2 g via INTRAVENOUS
  Filled 2021-10-01: qty 50

## 2021-10-01 MED ORDER — HEPARIN SOD (PORK) LOCK FLUSH 100 UNIT/ML IV SOLN
500.0000 [IU] | Freq: Once | INTRAVENOUS | Status: AC | PRN
  Administered 2021-10-01: 500 [IU]

## 2021-10-01 MED ORDER — KATE FARMS STANDARD 1.4 EN LIQD: ENTERAL | Status: AC

## 2021-10-01 MED ORDER — SODIUM CHLORIDE 0.9 % IV SOLN
150.0000 mg | Freq: Once | INTRAVENOUS | Status: AC
  Administered 2021-10-01: 150 mg via INTRAVENOUS
  Filled 2021-10-01: qty 150

## 2021-10-01 MED ORDER — SODIUM CHLORIDE 0.9 % IV SOLN: Freq: Once | INTRAVENOUS | Status: AC

## 2021-10-01 MED ORDER — SODIUM CHLORIDE 0.9 % IV SOLN
30.0000 mg/m2 | Freq: Once | INTRAVENOUS | Status: AC
  Administered 2021-10-01: 59 mg via INTRAVENOUS
  Filled 2021-10-01: qty 59

## 2021-10-01 MED ORDER — HYDROCODONE-ACETAMINOPHEN 5-325 MG PO TABS
1.0000 | ORAL_TABLET | Freq: Once | ORAL | Status: AC
  Administered 2021-10-01: 2 via ORAL
  Filled 2021-10-01: qty 2

## 2021-10-01 NOTE — Telephone Encounter (Signed)
Reassurance provided. ? ?Encouraged to try and swallow food and liquids if ok with oncologist/speech therapy in order to limit scar contraction and improve long term swallowing function following XRT. ? ?Appetite good.  Taste poor.  Not depressed (on SSRI). ? ?I will call and check on him in a couple of weeks. ?

## 2021-10-01 NOTE — Patient Instructions (Signed)
[facility]  Discharge Instructions: Thank you for choosing [facility] to provide your oncology and hematology care.   If you have a lab appointment with the Cancer Center, please go directly to the Cancer Center and check in at the registration area.   Wear comfortable clothing and clothing appropriate for easy access to any Portacath or PICC line.   We strive to give you quality time with your provider. You may need to reschedule your appointment if you arrive late (15 or more minutes).  Arriving late affects you and other patients whose appointments are after yours.  Also, if you miss three or more appointments without notifying the office, you may be dismissed from the clinic at the provider's discretion.      For prescription refill requests, have your pharmacy contact our office and allow 72 hours for refills to be completed.    Today you received the following chemotherapy and/or immunotherapy agents cisplatin   To help prevent nausea and vomiting after your treatment, we encourage you to take your nausea medication as directed.  BELOW ARE SYMPTOMS THAT SHOULD BE REPORTED IMMEDIATELY: *FEVER GREATER THAN 100.4 F (38 C) OR HIGHER *CHILLS OR SWEATING *NAUSEA AND VOMITING THAT IS NOT CONTROLLED WITH YOUR NAUSEA MEDICATION *UNUSUAL SHORTNESS OF BREATH *UNUSUAL BRUISING OR BLEEDING *URINARY PROBLEMS (pain or burning when urinating, or frequent urination) *BOWEL PROBLEMS (unusual diarrhea, constipation, pain near the anus) TENDERNESS IN MOUTH AND THROAT WITH OR WITHOUT PRESENCE OF ULCERS (sore throat, sores in mouth, or a toothache) UNUSUAL RASH, SWELLING OR PAIN  UNUSUAL VAGINAL DISCHARGE OR ITCHING   Items with * indicate a potential emergency and should be followed up as soon as possible or go to the Emergency Department if any problems should occur.  Please show the CHEMOTHERAPY ALERT CARD or IMMUNOTHERAPY ALERT CARD at check-in to the  Emergency Department and triage nurse.  Should you have questions after your visit or need to cancel or reschedule your appointment, please contact [facility]  Dept: [phone]  and follow the prompts.  Office hours are 8:00 a.m. to 4:30 p.m. Monday - Friday. Please note that voicemails left after 4:00 p.m. may not be returned until the following business day.  We are closed weekends and major holidays. You have access to a nurse at all times for urgent questions. Please call the main number to the clinic Dept: [phone] and follow the prompts.   For any non-urgent questions, you may also contact your provider using MyChart. We now offer e-Visits for anyone 18 and older to request care online for non-urgent symptoms. For details visit [email or web address].   Also download the MyChart app! Go to the app store, search "MyChart", open the app, select , and log in with your MyChart username and password.  Due to Covid, a mask is required upon entering the hospital/clinic. If you do not have a mask, one will be given to you upon arrival. For doctor visits, patients may have 1 support person aged 18 or older with them. For treatment visits, patients cannot have anyone with them due to current Covid guidelines and our immunocompromised population.   

## 2021-10-01 NOTE — Progress Notes (Signed)
Nutrition Follow-up: ? 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 Dose reduced starting C2 due to tinnitus.  ? ?Met with patient in infusion. He reports worsening sore throat. Patient does not want to lose ability to swallow so is forcing himself to eat some orally. He reports oily taste to most foods. This is horrible. Patient tried canned peaches last night, they were spicy. He has been tolerating popsicles,  eggs, potatoes, red/yellow bell peppers, and chicken. Patient is giving 4-6 bottles of Osmolite 1.5 via tube. He is almost finished with the complimentary case. Patient denies nausea, vomiting, diarrhea, constipation. He is taking daily stool softener.  ? ?Medications: reviewed ? ?Labs: 5/15 - Na 133, BUN 21 ? ?Anthropometrics: Weight 151 lb 8 oz on 5/16 decreased 3.2% (5 lbs) in 7 days. His weights have decreased 9.6% (16 lbs) in the last 3.5 weeks; this is significant  ? ?5/09 - 156 lb 9.6 oz  ?5/3 - 163 lb 4 oz  ?5/2 - 159 lb 1.6 oz ?4/21 - 167 lb 6 oz  ? ?-pt adamant weights fluctuate ~5-10 lbs/week at baseline. Current wt is his normal weight.    ? ?Estimated Energy Needs ? ?Kcals: 7409-9278 ?Protein: 120-134 ?Fluid: 2.6 L ? ?[employer] 1.4 ?-6 cartons via tube split over four feedings. Flush tube with 90 ml water before and after feedings. Drink by mouth or give via tube additional 2 cups water. This provides 2730 kcal, 120 grams protein, 1404 ml free water (2598 ml total water with flushes). Meets 100% needs  ? ?NUTRITION DIAGNOSIS: Inadequate oral intake ongoing ? ? ?INTERVENTION:  ?Continue soft moist foods as tolerated ?Reviewed strategies for altered taste - pt has handout ?Continue supplementing with tube feedings, regimen outlined above ?Complimentary case of Ensure Enlive and [employer] 1.4 provided  ?Will contact [employer] for formula and supplies ?  ? ?MONITORING, EVALUATION, GOAL: weight trends, tube feeding, oral intake ? ? ?NEXT VISIT:  Wednesday May 24 in infusion ? ? ? ?

## 2021-10-02 ENCOUNTER — Other Ambulatory Visit: Payer: Self-pay

## 2021-10-02 ENCOUNTER — Ambulatory Visit
Admission: RE | Admit: 2021-10-02 | Discharge: 2021-10-02 | Disposition: A | Discharge status: Home / Self Care | Payer: Commercial Managed Care - PPO | Source: Ambulatory Visit | Attending: Radiation Oncology | Admitting: Radiation Oncology

## 2021-10-02 DIAGNOSIS — Z5111 Encounter for antineoplastic chemotherapy: Secondary | ICD-10-CM | POA: Diagnosis not present

## 2021-10-02 LAB — RAD ONC ARIA SESSION SUMMARY
Course Elapsed Days: 15
Plan Fractions Treated to Date: 12
Plan Prescribed Dose Per Fraction: 2 Gy
Plan Total Fractions Prescribed: 35
Plan Total Prescribed Dose: 70 Gy
Reference Point Dosage Given to Date: 24 Gy
Reference Point Session Dosage Given: 2 Gy
Session Number: 12

## 2021-10-03 ENCOUNTER — Other Ambulatory Visit: Payer: Self-pay

## 2021-10-03 ENCOUNTER — Ambulatory Visit
Admission: RE | Admit: 2021-10-03 | Discharge: 2021-10-03 | Disposition: A | Discharge status: Home / Self Care | Payer: Commercial Managed Care - PPO | Source: Ambulatory Visit | Attending: Radiation Oncology | Admitting: Radiation Oncology

## 2021-10-03 DIAGNOSIS — Z5111 Encounter for antineoplastic chemotherapy: Secondary | ICD-10-CM | POA: Diagnosis not present

## 2021-10-03 LAB — RAD ONC ARIA SESSION SUMMARY
Course Elapsed Days: 16
Plan Fractions Treated to Date: 13
Plan Prescribed Dose Per Fraction: 2 Gy
Plan Total Fractions Prescribed: 35
Plan Total Prescribed Dose: 70 Gy
Reference Point Dosage Given to Date: 26 Gy
Reference Point Session Dosage Given: 2 Gy
Session Number: 13

## 2021-10-06 ENCOUNTER — Ambulatory Visit
Admission: RE | Admit: 2021-10-06 | Discharge: 2021-10-06 | Disposition: A | Discharge status: Home / Self Care | Payer: Commercial Managed Care - PPO | Source: Ambulatory Visit | Attending: Radiation Oncology | Admitting: Radiation Oncology

## 2021-10-06 ENCOUNTER — Other Ambulatory Visit: Payer: Self-pay | Admitting: Radiation Oncology

## 2021-10-06 ENCOUNTER — Other Ambulatory Visit: Payer: Self-pay

## 2021-10-06 DIAGNOSIS — C113 Malignant neoplasm of anterior wall of nasopharynx: Secondary | ICD-10-CM

## 2021-10-06 DIAGNOSIS — Z5111 Encounter for antineoplastic chemotherapy: Secondary | ICD-10-CM | POA: Diagnosis not present

## 2021-10-06 LAB — RAD ONC ARIA SESSION SUMMARY
Course Elapsed Days: 19
Plan Fractions Treated to Date: 14
Plan Prescribed Dose Per Fraction: 2 Gy
Plan Total Fractions Prescribed: 35
Plan Total Prescribed Dose: 70 Gy
Reference Point Dosage Given to Date: 28 Gy
Reference Point Session Dosage Given: 2 Gy
Session Number: 14

## 2021-10-06 MED ORDER — SUCRALFATE 1 G PO TABS: ORAL_TABLET | ORAL | 3 refills | Status: DC

## 2021-10-06 MED ORDER — SONAFINE EX EMUL
1.0000 "application " | Freq: Two times a day (BID) | CUTANEOUS | Status: DC
  Administered 2021-10-06: 1 via TOPICAL

## 2021-10-07 ENCOUNTER — Other Ambulatory Visit: Payer: Self-pay

## 2021-10-07 ENCOUNTER — Encounter: Payer: Self-pay | Admitting: Hematology and Oncology

## 2021-10-07 ENCOUNTER — Inpatient Hospital Stay (HOSPITAL_BASED_OUTPATIENT_CLINIC_OR_DEPARTMENT_OTHER): Payer: Commercial Managed Care - PPO | Admitting: Hematology and Oncology

## 2021-10-07 ENCOUNTER — Ambulatory Visit
Admission: RE | Admit: 2021-10-07 | Discharge: 2021-10-07 | Disposition: A | Discharge status: Home / Self Care | Payer: Commercial Managed Care - PPO | Source: Ambulatory Visit | Attending: Radiation Oncology | Admitting: Radiation Oncology

## 2021-10-07 ENCOUNTER — Inpatient Hospital Stay: Payer: Commercial Managed Care - PPO

## 2021-10-07 ENCOUNTER — Other Ambulatory Visit (HOSPITAL_COMMUNITY): Payer: Self-pay

## 2021-10-07 DIAGNOSIS — C090 Malignant neoplasm of tonsillar fossa: Secondary | ICD-10-CM

## 2021-10-07 DIAGNOSIS — Z5111 Encounter for antineoplastic chemotherapy: Secondary | ICD-10-CM | POA: Diagnosis not present

## 2021-10-07 DIAGNOSIS — Z95828 Presence of other vascular implants and grafts: Secondary | ICD-10-CM

## 2021-10-07 DIAGNOSIS — G893 Neoplasm related pain (acute) (chronic): Secondary | ICD-10-CM

## 2021-10-07 DIAGNOSIS — R634 Abnormal weight loss: Secondary | ICD-10-CM | POA: Diagnosis not present

## 2021-10-07 LAB — RAD ONC ARIA SESSION SUMMARY
Course Elapsed Days: 20
Plan Fractions Treated to Date: 15
Plan Prescribed Dose Per Fraction: 2 Gy
Plan Total Fractions Prescribed: 35
Plan Total Prescribed Dose: 70 Gy
Reference Point Dosage Given to Date: 30 Gy
Reference Point Session Dosage Given: 2 Gy
Session Number: 15

## 2021-10-07 LAB — CBC WITH DIFFERENTIAL (CANCER CENTER ONLY)
Abs Immature Granulocytes: 0.03 10*3/uL (ref 0.00–0.07)
Basophils Absolute: 0 10*3/uL (ref 0.0–0.1)
Basophils Relative: 0 %
Eosinophils Absolute: 0 10*3/uL (ref 0.0–0.5)
Eosinophils Relative: 0 %
HCT: 34.6 % — ABNORMAL LOW (ref 39.0–52.0)
Hemoglobin: 12 g/dL — ABNORMAL LOW (ref 13.0–17.0)
Immature Granulocytes: 1 %
Lymphocytes Relative: 8 %
Lymphs Abs: 0.5 10*3/uL — ABNORMAL LOW (ref 0.7–4.0)
MCH: 30.9 pg (ref 26.0–34.0)
MCHC: 34.7 g/dL (ref 30.0–36.0)
MCV: 89.2 fL (ref 80.0–100.0)
Monocytes Absolute: 0.7 10*3/uL (ref 0.1–1.0)
Monocytes Relative: 12 %
Neutro Abs: 4.6 10*3/uL (ref 1.7–7.7)
Neutrophils Relative %: 79 %
Platelet Count: 252 10*3/uL (ref 150–400)
RBC: 3.88 MIL/uL — ABNORMAL LOW (ref 4.22–5.81)
RDW: 12.5 % (ref 11.5–15.5)
WBC Count: 5.7 10*3/uL (ref 4.0–10.5)
nRBC: 0 % (ref 0.0–0.2)

## 2021-10-07 LAB — BASIC METABOLIC PANEL - CANCER CENTER ONLY
Anion gap: 7 (ref 5–15)
BUN: 23 mg/dL — ABNORMAL HIGH (ref 6–20)
CO2: 29 mmol/L (ref 22–32)
Calcium: 9.4 mg/dL (ref 8.9–10.3)
Chloride: 99 mmol/L (ref 98–111)
Creatinine: 0.73 mg/dL (ref 0.61–1.24)
GFR, Estimated: >60 mL/min (ref >60)
Glucose, Bld: 103 mg/dL — ABNORMAL HIGH (ref 70–99)
Potassium: 4.4 mmol/L (ref 3.5–5.1)
Sodium: 135 mmol/L (ref 135–145)

## 2021-10-07 LAB — MAGNESIUM: Magnesium: 2.3 mg/dL (ref 1.7–2.4)

## 2021-10-07 MED ORDER — HYDROCODONE-ACETAMINOPHEN 7.5-325 MG/15ML PO SOLN
15.0000 mL | Freq: Four times a day (QID) | ORAL | 0 refills | Status: DC | PRN
  Filled 2021-10-07: qty 473, 8d supply, fill #0

## 2021-10-07 MED ORDER — SODIUM CHLORIDE 0.9% FLUSH
10.0000 mL | Freq: Once | INTRAVENOUS | Status: AC
  Administered 2021-10-07: 10 mL

## 2021-10-07 MED ORDER — HEPARIN SOD (PORK) LOCK FLUSH 100 UNIT/ML IV SOLN
500.0000 [IU] | Freq: Once | INTRAVENOUS | Status: AC
  Administered 2021-10-07: 500 [IU]

## 2021-10-07 MED FILL — Dexamethasone Sodium Phosphate Inj 100 MG/10ML: INTRAMUSCULAR | Qty: 1 | Status: AC

## 2021-10-07 MED FILL — Fosaprepitant Dimeglumine For IV Infusion 150 MG (Base Eq): INTRAVENOUS | Qty: 5 | Status: AC

## 2021-10-07 NOTE — Progress Notes (Signed)
[facility] NOTE  Patient Care Team: Patient, No Pcp Per (Inactive) as PCP - General (General Practice) Powers, Sheppard Coil, MD as Referring Physician (Neurosurgery) Malmfelt, Stephani Police, RN as Oncology Nurse Navigator Eppie Gibson, MD as Consulting Physician (Radiation Oncology) Benay Pike, MD as Consulting Physician (Hematology and Oncology)  CHIEF COMPLAINTS/PURPOSE OF CONSULTATION:  SCC oropharynx  ASSESSMENT & PLAN:   No problem-specific Assessment & Plan notes found for this encounter.      No orders of the defined types were placed in this encounter.    HISTORY OF PRESENTING ILLNESS:  Johnny Mata 46 y.o. 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  Oncology History  Primary cancer of tonsillar fossa ([place])  08/19/2021 Imaging   Patient complained of spitting chunks of tissue hence had a CT maxillofacial with contrast as well as with the neck with contrast.  Mucosal hyperenhancement in the right nasopharynx extending to the oropharynx with mostly soft tissue density in the region of right palatine tonsil most suspicious for malignancy.  There is also a 1.8 x 1.5 x 2.4 cm peripherally enhancing hyperintense lesion in the right retropharynx suspicious for necrotic lymph node with additional prominent right level 2 lymph nodes measuring up to 1.3 cm in short axis with suspected extracapsular extension at level to me.   08/27/2021 Initial Diagnosis   Primary cancer of tonsillar fossa ([place])    09/01/2021 PET scan   PET scan from April 17 showed locally advanced right palatine tonsil primary with ipsilateral cervical nodal metastasis no evidence of hypermetabolic extracervical metastasis.  Tiny pulmonary nodules below PET resolution consider chest CT follow-up at 6 months   09/03/2021 Cancer Staging   Staging form: Pharynx - P16 Negative Oropharynx, AJCC 8th Edition - Clinical stage from 09/03/2021: Stage IVA (cT3, cN2b, cM0, p16-) - Signed by Eppie Gibson,  MD on 09/03/2021 Stage prefix: Initial diagnosis    09/17/2021 -  Chemotherapy   Patient is on Treatment Plan : HEAD/NECK Cisplatin q7d       He started cycle 1 on 09/17/2021.   C2D1 cisplatin chemotherapy 09/24/2021  Interval History  He is having random episodes of tinnitus for the past week. It will change ears, it can be different frequencies, right now he feels like he is under water. Pain is reasonably well controlled, taking pain med every 6 hrs, currently rated at 3/10 He is still able to swallow everything, weight is stable No nausea , vomiting, diarrhea No neuropathy. Rest of the pertinent 10 point ROS reviewed and negative  MEDICAL HISTORY:  Past Medical History:  Diagnosis Date   Anxiety    Never officially diagnosed; does not take medication for anxiety   Asthma    Hasn't had an episode since childhood   Depression     SURGICAL HISTORY: Past Surgical History:  Procedure Laterality Date   ELBOW SURGERY Bilateral    HIP SURGERY Left    IR GASTROSTOMY TUBE MOD SED  09/11/2021   IR IMAGING GUIDED PORT INSERTION  09/11/2021   NECK SURGERY  05/09/2018   Cervical fusion (Dr. Atilano Ina)   PANENDOSCOPY N/A 08/19/2021   Procedure: PANENDOSCOPY WITH BIPOSY--direct laryngoscopy, flexible bronchoscopy, rigid esophagoscopy, biopsy of lesion in throat;  Surgeon: Marcina Millard, MD;  Location: [facility];  Service: ENT;  Laterality: N/A;   PLANTAR FASCIA SURGERY     SHOULDER SURGERY Left    TONSILLECTOMY     WISDOM TOOTH EXTRACTION Bilateral 1995    SOCIAL HISTORY: Social History  Socioeconomic History   Marital status: Single    Spouse name: Not on file   Number of children: Not on file   Years of education: Not on file   Highest education level: Not on file  Occupational History   Not on file  Tobacco Use   Smoking status: Former    Packs/day: 1.50    Years: 20.00    Pack years: 30.00    Types: Cigarettes    Quit date: 10/02/2019    Years since  quitting: 2.0   Smokeless tobacco: Never  Vaping Use   Vaping Use: Never used  Substance and Sexual Activity   Alcohol use: Not Currently    Alcohol/week: 7.0 standard drinks    Types: 7 Shots of liquor per week   Drug use: Yes    Types: Marijuana   Sexual activity: Not Currently  Other Topics Concern   Not on file  Social History Narrative   Not on file   Social Determinants of Health   Financial Resource Strain: Medium Risk   Difficulty of Paying Living Expenses: Somewhat hard  Food Insecurity: Not on file  Transportation Needs: No Transportation Needs   Lack of Transportation (Medical): No   Lack of Transportation (Non-Medical): No  Physical Activity: Not on file  Stress: Not on file  Social Connections: Not on file  Intimate Partner Violence: Not on file    FAMILY HISTORY: Family History  Problem Relation Age of Onset   Colon cancer Mother     ALLERGIES:  is allergic to dust mite extract, other, and pollen extract-tree extract [pollen extract].  MEDICATIONS:  Current Outpatient Medications  Medication Sig Dispense Refill   dexamethasone (DECADRON) 4 MG tablet Take 2 tablets (8 mg total) by mouth daily. Take daily x 3 days starting the day after cisplatin chemotherapy. Take with food. 30 tablet 1   diphenhydrAMINE (BENADRYL) 25 mg capsule Take 1 capsule (25 mg total) by mouth daily as needed for sleep. 30 capsule 0   ibuprofen (ADVIL) 200 MG tablet Take 400 mg by mouth See admin instructions. Pt states they are taking 400 mg every 2-3 hours as needed.     Ibuprofen-diphenhydrAMINE Cit (IBUPROFEN PM PO) Take 2 tablets by mouth at bedtime.     lidocaine (XYLOCAINE) 2 % solution Patient: Mix 1part 2% viscous lidocaine, 1part H20. Swish & swallow 68m of diluted mixture, 3105m before meals and at bedtime, up to QID 200 mL 3   lidocaine-prilocaine (EMLA) cream Apply to affected area once 30 g 3   Nutritional Supplements (KATE FARMS STANDARD 1.4) LIQD 6 cartons via tube  split over four feedings. Flush tube with 90 ml water before and after feedings. Drink by mouth or give via tube additional 2 cups water. This provides 2730 kcal, 120 grams protein, 1404 ml free water (2598 ml total water with flushes). Meets 100% needs      ondansetron (ZOFRAN) 8 MG tablet Take 1 tablet (8 mg total) by mouth 2 (two) times daily as needed. Start on the third day after cisplatin chemotherapy. 30 tablet 1   prochlorperazine (COMPAZINE) 10 MG tablet Take 1 tablet (10 mg total) by mouth every 6 (six) hours as needed (Nausea or vomiting). 30 tablet 1   sertraline (ZOLOFT) 100 MG tablet Take 100 mg by mouth daily.     sucralfate (CARAFATE) 1 g tablet Dissolve 1 tablet in 10 mL H20 and swallow 10-20 min prior to meals and bedtime up to QID PRN soreness. 40 tablet  3   traZODone (DESYREL) 50 MG tablet Take 50 mg by mouth at bedtime. Able to take 50-'200mg'$  as needed for sleep     No current facility-administered medications for this visit.     PHYSICAL EXAMINATION: ECOG PERFORMANCE STATUS: 0 - Asymptomatic  Vitals:   10/07/21 1518  BP: 121/88  Pulse: 98  Resp: 18  Temp: 97.9 F (36.6 C)  SpO2: 99%     Filed Weights   10/07/21 1518  Weight: 157 lb 6.4 oz (71.4 kg)    GENERAL:alert, no distress and comfortable OROPHARYNX: Exudative mass noted on the right oropharyngeal region shrinking on exam NECK: Palpable right cervical lymphadenopathy.  Extremely painful on palpation.  No significant change on my exam LYMPH: right cervical lymphadenopathy significantly reduced. Abdomen: G-tube appears well, no concern for infection. Lower extremities: No lower extremity edema   LABORATORY DATA:  I have reviewed the data as listed Lab Results  Component Value Date   WBC 5.7 10/07/2021   HGB 12.0 (L) 10/07/2021   HCT 34.6 (L) 10/07/2021   MCV 89.2 10/07/2021   PLT 252 10/07/2021     Chemistry      Component Value Date/Time   NA 133 (L) 09/29/2021 1513   K 4.1 09/29/2021 1513    CL 98 09/29/2021 1513   CO2 29 09/29/2021 1513   BUN 21 (H) 09/29/2021 1513   CREATININE 0.68 09/29/2021 1513      Component Value Date/Time   CALCIUM 8.6 (L) 09/29/2021 1513   ALKPHOS 57 08/19/2021 1012   AST 16 08/19/2021 1012   ALT 12 08/19/2021 1012   BILITOT 0.5 08/19/2021 1012       RADIOGRAPHIC STUDIES: I have personally reviewed the radiological images as listed and agreed with the findings in the report. IR Gastrostomy Tube  Result Date: 09/11/2021 INDICATION: 46 year old male with a history head and neck carcinoma referred for port catheter and percutaneous gastrostomy EXAM: IMAGE GUIDED PORT CATHETER IMAGE GUIDED PERCUTANEOUS GASTROSTOMY MEDICATIONS: 2 g Ancef; The antibiotic was administered within an appropriate time interval prior to skin puncture. ANESTHESIA/SEDATION: Moderate (conscious) sedation was employed during this procedure. A total of Versed 5.0 mg and Fentanyl 200 mcg was administered intravenously. Moderate Sedation Time: 47 minutes. The patient's level of consciousness and vital signs were monitored continuously by radiology nursing throughout the procedure under my direct supervision. FLUOROSCOPY TIME:  Fluoroscopy Time: 3 minutes 36 seconds (16 mGy). COMPLICATIONS: None PROCEDURE: Informed written consent was obtained from the patient after a thorough discussion of the procedural risks, benefits and alternatives. All questions were addressed. Maximal Sterile Barrier Technique was utilized including caps, mask, sterile gowns, sterile gloves, sterile drape, hand hygiene and skin antiseptic. A timeout was performed prior to the initiation of the procedure. Ultrasound survey was performed with images stored and sent to PACs. Right IJ vein documented to be patent. The right neck and chest was prepped with chlorhexidine, and draped in the usual sterile fashion using maximum barrier technique (cap and mask, sterile gown, sterile gloves, large sterile sheet, hand hygiene  and cutaneous antiseptic). Local anesthesia was attained by infiltration with 1% lidocaine without epinephrine. Ultrasound demonstrated patency of the right internal jugular vein, and this was documented with an image. Under real-time ultrasound guidance, this vein was accessed with a 21 gauge micropuncture needle and image documentation was performed. A small dermatotomy was made at the access site with an 11 scalpel. A 0.018" wire was advanced into the SVC and used to estimate the length of  the internal catheter. The access needle exchanged for a 60F micropuncture vascular sheath. The 0.018" wire was then removed and a 0.035" wire advanced into the IVC. An appropriate location for the subcutaneous reservoir was selected below the clavicle and an incision was made through the skin and underlying soft tissues. The subcutaneous tissues were then dissected using a combination of blunt and sharp surgical technique and a pocket was formed. A single lumen power injectable portacatheter was then tunneled through the subcutaneous tissues from the pocket to the dermatotomy and the port reservoir placed within the subcutaneous pocket. The venous access site was then serially dilated and a peel away vascular sheath placed over the wire. The wire was removed and the port catheter advanced into position under fluoroscopic guidance. The catheter tip is positioned in the cavoatrial junction. This was documented with a spot image. The portacatheter was then tested and found to flush and aspirate well. The port was flushed with saline followed by 100 units/mL heparinized saline. The pocket was then closed in two layers using first subdermal inverted interrupted absorbable sutures followed by a running subcuticular suture. The epidermis was then sealed with Dermabond. The dermatotomy at the venous access site was also seal with Dermabond. Steri-Strips were placed. We then proceeded with gastrostomy tube placement. The epigastrium was  prepped with Betadine in a sterile fashion, and a sterile drape was applied covering the operative field. A sterile gown and sterile gloves were used for the procedure. A 5-French orogastric tube is placed under fluoroscopic guidance. Scout imaging of the abdomen confirms barium within the transverse colon. The stomach was distended with gas. Under fluoroscopic guidance, an 18 gauge needle was utilized to puncture the anterior wall of the body of the stomach. An Amplatz wire was advanced through the needle passing a T fastener into the lumen of the stomach. The T fastener was secured for gastropexy. A 9-French sheath was inserted. A snare was advanced through the 9-French sheath. A Britta Mccreedy was advanced through the orogastric tube. It was snared then pulled out the oral cavity, pulling the snare, as well. The leading edge of the gastrostomy was attached to the snare. It was then pulled down the esophagus and out the percutaneous site. It was secured in place. Contrast was injected. No complication Patient tolerated the procedure well and remained hemodynamically stable throughout. No complications encountered and no significant blood loss encountered IMPRESSION: Status post image guided right IJ port catheter and percutaneous gastrostomy. Signed, Dulcy Fanny. Dellia Nims, RPVI [facility] Radiology Electronically Signed   By: Corrie Mckusick D.O.   On: 09/11/2021 13:22   IR IMAGING GUIDED PORT INSERTION  Result Date: 09/11/2021 INDICATION: 45 year old male with a history head and neck carcinoma referred for port catheter and percutaneous gastrostomy EXAM: IMAGE GUIDED PORT CATHETER IMAGE GUIDED PERCUTANEOUS GASTROSTOMY MEDICATIONS: 2 g Ancef; The antibiotic was administered within an appropriate time interval prior to skin puncture. ANESTHESIA/SEDATION: Moderate (conscious) sedation was employed during this procedure. A total of Versed 5.0 mg and Fentanyl 200 mcg was  administered intravenously. Moderate Sedation Time: 47 minutes. The patient's level of consciousness and vital signs were monitored continuously by radiology nursing throughout the procedure under my direct supervision. FLUOROSCOPY TIME:  Fluoroscopy Time: 3 minutes 36 seconds (16 mGy). COMPLICATIONS: None PROCEDURE: Informed written consent was obtained from the patient after a thorough discussion of the procedural risks, benefits and alternatives. All questions were addressed. Maximal Sterile Barrier Technique was utilized including caps, mask, sterile  gowns, sterile gloves, sterile drape, hand hygiene and skin antiseptic. A timeout was performed prior to the initiation of the procedure. Ultrasound survey was performed with images stored and sent to PACs. Right IJ vein documented to be patent. The right neck and chest was prepped with chlorhexidine, and draped in the usual sterile fashion using maximum barrier technique (cap and mask, sterile gown, sterile gloves, large sterile sheet, hand hygiene and cutaneous antiseptic). Local anesthesia was attained by infiltration with 1% lidocaine without epinephrine. Ultrasound demonstrated patency of the right internal jugular vein, and this was documented with an image. Under real-time ultrasound guidance, this vein was accessed with a 21 gauge micropuncture needle and image documentation was performed. A small dermatotomy was made at the access site with an 11 scalpel. A 0.018" wire was advanced into the SVC and used to estimate the length of the internal catheter. The access needle exchanged for a 50F micropuncture vascular sheath. The 0.018" wire was then removed and a 0.035" wire advanced into the IVC. An appropriate location for the subcutaneous reservoir was selected below the clavicle and an incision was made through the skin and underlying soft tissues. The subcutaneous tissues were then dissected using a combination of blunt and sharp surgical technique and a  pocket was formed. A single lumen power injectable portacatheter was then tunneled through the subcutaneous tissues from the pocket to the dermatotomy and the port reservoir placed within the subcutaneous pocket. The venous access site was then serially dilated and a peel away vascular sheath placed over the wire. The wire was removed and the port catheter advanced into position under fluoroscopic guidance. The catheter tip is positioned in the cavoatrial junction. This was documented with a spot image. The portacatheter was then tested and found to flush and aspirate well. The port was flushed with saline followed by 100 units/mL heparinized saline. The pocket was then closed in two layers using first subdermal inverted interrupted absorbable sutures followed by a running subcuticular suture. The epidermis was then sealed with Dermabond. The dermatotomy at the venous access site was also seal with Dermabond. Steri-Strips were placed. We then proceeded with gastrostomy tube placement. The epigastrium was prepped with Betadine in a sterile fashion, and a sterile drape was applied covering the operative field. A sterile gown and sterile gloves were used for the procedure. A 5-French orogastric tube is placed under fluoroscopic guidance. Scout imaging of the abdomen confirms barium within the transverse colon. The stomach was distended with gas. Under fluoroscopic guidance, an 18 gauge needle was utilized to puncture the anterior wall of the body of the stomach. An Amplatz wire was advanced through the needle passing a T fastener into the lumen of the stomach. The T fastener was secured for gastropexy. A 9-French sheath was inserted. A snare was advanced through the 9-French sheath. A Britta Mccreedy was advanced through the orogastric tube. It was snared then pulled out the oral cavity, pulling the snare, as well. The leading edge of the gastrostomy was attached to the snare. It was then pulled down the esophagus and out the  percutaneous site. It was secured in place. Contrast was injected. No complication Patient tolerated the procedure well and remained hemodynamically stable throughout. No complications encountered and no significant blood loss encountered IMPRESSION: Status post image guided right IJ port catheter and percutaneous gastrostomy. Signed, Dulcy Fanny. Dellia Nims, RPVI [facility] Radiology Electronically Signed   By: Corrie Mckusick D.O.   On: 09/11/2021 13:22  All questions were answered. The patient knows to call the clinic with any problems, questions or concerns. I spent 30 minutes in the care of this patient including H and P, review of records, counseling and coordination of care.     Benay Pike, MD 10/07/2021 3:34 PM

## 2021-10-07 NOTE — Assessment & Plan Note (Signed)
He is unable to swallow pills at this time.  We will transition him to liquid hydrocodone every 6 hours as needed for pain.  Prescription has been dispensed to the pharmacy of his choice

## 2021-10-07 NOTE — Progress Notes (Signed)
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  He is here before cycle 3 of cisplatin. Since we will dose reduce cisplatin to 30 mg per metered squared, tinnitus has significantly improved. Okay to plan with the same dose if labs are within parameters.  Clinically he has significant response with total resolution of cervical lymphadenopathy and improvement in oropharyngeal tumor.  Weight loss, unintentional He has gained 5 pounds since his last visit.  Continue G-tube feeding as recommended.  We will continue to monitor weight  Cancer related pain He is unable to swallow pills at this time.  We will transition him to liquid hydrocodone every 6 hours as needed for pain.  Prescription has been dispensed to the pharmacy of his choice  No orders of the defined types were placed in this encounter.  Total time spent: 30 minutes  HISTORY OF PRESENTING ILLNESS:  Johnny Mata 46 y.o. 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  Oncology History  Primary cancer of tonsillar fossa ([place])  08/19/2021 Imaging   Patient complained of spitting chunks of tissue hence had a CT maxillofacial with contrast as well as with the neck with contrast.  Mucosal hyperenhancement in the right nasopharynx extending to the oropharynx with mostly soft tissue density in the  region of right palatine tonsil most suspicious for malignancy.  There is also a 1.8 x 1.5 x 2.4 cm peripherally enhancing hyperintense lesion in the right retropharynx suspicious for necrotic lymph node with additional prominent right level 2 lymph nodes measuring up to 1.3 cm in short axis with suspected extracapsular extension at level to me.   08/27/2021 Initial Diagnosis   Primary cancer of tonsillar fossa ([place])    09/01/2021 PET scan   PET scan from April 17 showed locally advanced right palatine tonsil primary with ipsilateral cervical nodal metastasis no evidence of hypermetabolic extracervical metastasis.  Tiny pulmonary nodules below PET resolution consider chest CT follow-up at 6 months   09/03/2021 Cancer Staging   Staging form: Pharynx - P16 Negative Oropharynx, AJCC 8th Edition - Clinical stage from 09/03/2021: Stage IVA (cT3, cN2b, cM0, p16-) - Signed by Eppie Gibson, MD on 09/03/2021 Stage prefix: Initial diagnosis    09/17/2021 -  Chemotherapy   Patient is on Treatment Plan : HEAD/NECK Cisplatin q7d       He started cycle 1 on 09/17/2021.   C2D1 cisplatin chemotherapy 09/24/2021  Interval History  Johnny Mata is here for follow-up.  Since last chemotherapy, tinnitus has significantly improved.  His sore throat is however very severe and he is unable to swallow any pills or food.  He is using G-tube for feeding and he has gained 5 pounds back.  The pain related to tumor has resolved.  No neuropathy reported.  No change in breathing or bowel habits. Rest of the pertinent 10 point ROS reviewed and negative  MEDICAL HISTORY:  Past Medical History:  Diagnosis Date   Anxiety  Never officially diagnosed; does not take medication for anxiety   Asthma    Hasn't had an episode since childhood   Depression     SURGICAL HISTORY: Past Surgical History:  Procedure Laterality Date   ELBOW SURGERY Bilateral    HIP SURGERY Left    IR GASTROSTOMY TUBE MOD SED  09/11/2021   IR  IMAGING GUIDED PORT INSERTION  09/11/2021   NECK SURGERY  05/09/2018   Cervical fusion (Dr. Atilano Ina)   PANENDOSCOPY N/A 08/19/2021   Procedure: PANENDOSCOPY WITH BIPOSY--direct laryngoscopy, flexible bronchoscopy, rigid esophagoscopy, biopsy of lesion in throat;  Surgeon: Marcina Millard, MD;  Location: [facility];  Service: ENT;  Laterality: N/A;   PLANTAR FASCIA SURGERY     SHOULDER SURGERY Left    TONSILLECTOMY     WISDOM TOOTH EXTRACTION Bilateral 1995    SOCIAL HISTORY: Social History   Socioeconomic History   Marital status: Single    Spouse name: Not on file   Number of children: Not on file   Years of education: Not on file   Highest education level: Not on file  Occupational History   Not on file  Tobacco Use   Smoking status: Former    Packs/day: 1.50    Years: 20.00    Pack years: 30.00    Types: Cigarettes    Quit date: 10/02/2019    Years since quitting: 2.0   Smokeless tobacco: Never  Vaping Use   Vaping Use: Never used  Substance and Sexual Activity   Alcohol use: Not Currently    Alcohol/week: 7.0 standard drinks    Types: 7 Shots of liquor per week   Drug use: Yes    Types: Marijuana   Sexual activity: Not Currently  Other Topics Concern   Not on file  Social History Narrative   Not on file   Social Determinants of Health   Financial Resource Strain: Medium Risk   Difficulty of Paying Living Expenses: Somewhat hard  Food Insecurity: Not on file  Transportation Needs: No Transportation Needs   Lack of Transportation (Medical): No   Lack of Transportation (Non-Medical): No  Physical Activity: Not on file  Stress: Not on file  Social Connections: Not on file  Intimate Partner Violence: Not on file    FAMILY HISTORY: Family History  Problem Relation Age of Onset   Colon cancer Mother     ALLERGIES:  is allergic to dust mite extract, other, and pollen extract-tree extract [pollen extract].  MEDICATIONS:  Current Outpatient  Medications  Medication Sig Dispense Refill   HYDROcodone-acetaminophen (HYCET) 7.5-325 mg/15 ml solution Take 15 mLs by mouth every 6 (six) hours as needed for moderate pain. 473 mL 0   dexamethasone (DECADRON) 4 MG tablet Take 2 tablets (8 mg total) by mouth daily. Take daily x 3 days starting the day after cisplatin chemotherapy. Take with food. 30 tablet 1   diphenhydrAMINE (BENADRYL) 25 mg capsule Take 1 capsule (25 mg total) by mouth daily as needed for sleep. 30 capsule 0   ibuprofen (ADVIL) 200 MG tablet Take 400 mg by mouth See admin instructions. Pt states they are taking 400 mg every 2-3 hours as needed.     Ibuprofen-diphenhydrAMINE Cit (IBUPROFEN PM PO) Take 2 tablets by mouth at bedtime.     lidocaine (XYLOCAINE) 2 % solution Patient: Mix 1part 2% viscous lidocaine, 1part H20. Swish & swallow 11m of diluted mixture, 361m before meals and at bedtime, up to QID 200 mL 3  lidocaine-prilocaine (EMLA) cream Apply to affected area once 30 g 3   Nutritional Supplements (KATE FARMS STANDARD 1.4) LIQD 6 cartons via tube split over four feedings. Flush tube with 90 ml water before and after feedings. Drink by mouth or give via tube additional 2 cups water. This provides 2730 kcal, 120 grams protein, 1404 ml free water (2598 ml total water with flushes). Meets 100% needs      ondansetron (ZOFRAN) 8 MG tablet Take 1 tablet (8 mg total) by mouth 2 (two) times daily as needed. Start on the third day after cisplatin chemotherapy. 30 tablet 1   prochlorperazine (COMPAZINE) 10 MG tablet Take 1 tablet (10 mg total) by mouth every 6 (six) hours as needed (Nausea or vomiting). 30 tablet 1   sertraline (ZOLOFT) 100 MG tablet Take 100 mg by mouth daily.     sucralfate (CARAFATE) 1 g tablet Dissolve 1 tablet in 10 mL H20 and swallow 10-20 min prior to meals and bedtime up to QID PRN soreness. 40 tablet 3   traZODone (DESYREL) 50 MG tablet Take 50 mg by mouth at bedtime. Able to take 50-'200mg'$  as needed for  sleep     No current facility-administered medications for this visit.     PHYSICAL EXAMINATION: ECOG PERFORMANCE STATUS: 0 - Asymptomatic  Vitals:   10/07/21 1518  BP: 121/88  Pulse: 98  Resp: 18  Temp: 97.9 F (36.6 C)  SpO2: 99%     Filed Weights   10/07/21 1518  Weight: 157 lb 6.4 oz (71.4 kg)    GENERAL:alert, no distress and comfortable OROPHARYNX: Exudative mass noted on the right oropharyngeal region shrinking on exam NECK: No palpable right cervical lymphadenopathy.  Extremely painful on palpation.  LYMPH: right cervical lymphadenopathy significantly reduced. Abdomen: G-tube appears well, no concern for infection. Lower extremities: No lower extremity edema   LABORATORY DATA:  I have reviewed the data as listed Lab Results  Component Value Date   WBC 5.7 10/07/2021   HGB 12.0 (L) 10/07/2021   HCT 34.6 (L) 10/07/2021   MCV 89.2 10/07/2021   PLT 252 10/07/2021     Chemistry      Component Value Date/Time   NA 135 10/07/2021 1500   K 4.4 10/07/2021 1500   CL 99 10/07/2021 1500   CO2 29 10/07/2021 1500   BUN 23 (H) 10/07/2021 1500   CREATININE 0.73 10/07/2021 1500      Component Value Date/Time   CALCIUM 9.4 10/07/2021 1500   ALKPHOS 57 08/19/2021 1012   AST 16 08/19/2021 1012   ALT 12 08/19/2021 1012   BILITOT 0.5 08/19/2021 1012       RADIOGRAPHIC STUDIES: I have personally reviewed the radiological images as listed and agreed with the findings in the report. IR Gastrostomy Tube  Result Date: 09/11/2021 INDICATION: 46 year old male with a history head and neck carcinoma referred for port catheter and percutaneous gastrostomy EXAM: IMAGE GUIDED PORT CATHETER IMAGE GUIDED PERCUTANEOUS GASTROSTOMY MEDICATIONS: 2 g Ancef; The antibiotic was administered within an appropriate time interval prior to skin puncture. ANESTHESIA/SEDATION: Moderate (conscious) sedation was employed during this procedure. A total of Versed 5.0 mg and Fentanyl 200 mcg was  administered intravenously. Moderate Sedation Time: 47 minutes. The patient's level of consciousness and vital signs were monitored continuously by radiology nursing throughout the procedure under my direct supervision. FLUOROSCOPY TIME:  Fluoroscopy Time: 3 minutes 36 seconds (16 mGy). COMPLICATIONS: None PROCEDURE: Informed written consent was obtained from the patient after a thorough discussion  of the procedural risks, benefits and alternatives. All questions were addressed. Maximal Sterile Barrier Technique was utilized including caps, mask, sterile gowns, sterile gloves, sterile drape, hand hygiene and skin antiseptic. A timeout was performed prior to the initiation of the procedure. Ultrasound survey was performed with images stored and sent to PACs. Right IJ vein documented to be patent. The right neck and chest was prepped with chlorhexidine, and draped in the usual sterile fashion using maximum barrier technique (cap and mask, sterile gown, sterile gloves, large sterile sheet, hand hygiene and cutaneous antiseptic). Local anesthesia was attained by infiltration with 1% lidocaine without epinephrine. Ultrasound demonstrated patency of the right internal jugular vein, and this was documented with an image. Under real-time ultrasound guidance, this vein was accessed with a 21 gauge micropuncture needle and image documentation was performed. A small dermatotomy was made at the access site with an 11 scalpel. A 0.018" wire was advanced into the SVC and used to estimate the length of the internal catheter. The access needle exchanged for a 64F micropuncture vascular sheath. The 0.018" wire was then removed and a 0.035" wire advanced into the IVC. An appropriate location for the subcutaneous reservoir was selected below the clavicle and an incision was made through the skin and underlying soft tissues. The subcutaneous tissues were then dissected using a combination of blunt and sharp surgical technique and a  pocket was formed. A single lumen power injectable portacatheter was then tunneled through the subcutaneous tissues from the pocket to the dermatotomy and the port reservoir placed within the subcutaneous pocket. The venous access site was then serially dilated and a peel away vascular sheath placed over the wire. The wire was removed and the port catheter advanced into position under fluoroscopic guidance. The catheter tip is positioned in the cavoatrial junction. This was documented with a spot image. The portacatheter was then tested and found to flush and aspirate well. The port was flushed with saline followed by 100 units/mL heparinized saline. The pocket was then closed in two layers using first subdermal inverted interrupted absorbable sutures followed by a running subcuticular suture. The epidermis was then sealed with Dermabond. The dermatotomy at the venous access site was also seal with Dermabond. Steri-Strips were placed. We then proceeded with gastrostomy tube placement. The epigastrium was prepped with Betadine in a sterile fashion, and a sterile drape was applied covering the operative field. A sterile gown and sterile gloves were used for the procedure. A 5-French orogastric tube is placed under fluoroscopic guidance. Scout imaging of the abdomen confirms barium within the transverse colon. The stomach was distended with gas. Under fluoroscopic guidance, an 18 gauge needle was utilized to puncture the anterior wall of the body of the stomach. An Amplatz wire was advanced through the needle passing a T fastener into the lumen of the stomach. The T fastener was secured for gastropexy. A 9-French sheath was inserted. A snare was advanced through the 9-French sheath. A Britta Mccreedy was advanced through the orogastric tube. It was snared then pulled out the oral cavity, pulling the snare, as well. The leading edge of the gastrostomy was attached to the snare. It was then pulled down the esophagus and out the  percutaneous site. It was secured in place. Contrast was injected. No complication Patient tolerated the procedure well and remained hemodynamically stable throughout. No complications encountered and no significant blood loss encountered IMPRESSION: Status post image guided right IJ port catheter and percutaneous gastrostomy. Signed, Dulcy Fanny. Dellia Nims, [place] [facility] Radiology Electronically Signed   By: Corrie Mckusick D.O.   On: 09/11/2021 13:22   IR IMAGING GUIDED PORT INSERTION  Result Date: 09/11/2021 INDICATION: 46 year old male with a history head and neck carcinoma referred for port catheter and percutaneous gastrostomy EXAM: IMAGE GUIDED PORT CATHETER IMAGE GUIDED PERCUTANEOUS GASTROSTOMY MEDICATIONS: 2 g Ancef; The antibiotic was administered within an appropriate time interval prior to skin puncture. ANESTHESIA/SEDATION: Moderate (conscious) sedation was employed during this procedure. A total of Versed 5.0 mg and Fentanyl 200 mcg was administered intravenously. Moderate Sedation Time: 47 minutes. The patient's level of consciousness and vital signs were monitored continuously by radiology nursing throughout the procedure under my direct supervision. FLUOROSCOPY TIME:  Fluoroscopy Time: 3 minutes 36 seconds (16 mGy). COMPLICATIONS: None PROCEDURE: Informed written consent was obtained from the patient after a thorough discussion of the procedural risks, benefits and alternatives. All questions were addressed. Maximal Sterile Barrier Technique was utilized including caps, mask, sterile gowns, sterile gloves, sterile drape, hand hygiene and skin antiseptic. A timeout was performed prior to the initiation of the procedure. Ultrasound survey was performed with images stored and sent to PACs. Right IJ vein documented to be patent. The right neck and chest was prepped with chlorhexidine, and draped in the usual sterile fashion using maximum barrier  technique (cap and mask, sterile gown, sterile gloves, large sterile sheet, hand hygiene and cutaneous antiseptic). Local anesthesia was attained by infiltration with 1% lidocaine without epinephrine. Ultrasound demonstrated patency of the right internal jugular vein, and this was documented with an image. Under real-time ultrasound guidance, this vein was accessed with a 21 gauge micropuncture needle and image documentation was performed. A small dermatotomy was made at the access site with an 11 scalpel. A 0.018" wire was advanced into the SVC and used to estimate the length of the internal catheter. The access needle exchanged for a 77F micropuncture vascular sheath. The 0.018" wire was then removed and a 0.035" wire advanced into the IVC. An appropriate location for the subcutaneous reservoir was selected below the clavicle and an incision was made through the skin and underlying soft tissues. The subcutaneous tissues were then dissected using a combination of blunt and sharp surgical technique and a pocket was formed. A single lumen power injectable portacatheter was then tunneled through the subcutaneous tissues from the pocket to the dermatotomy and the port reservoir placed within the subcutaneous pocket. The venous access site was then serially dilated and a peel away vascular sheath placed over the wire. The wire was removed and the port catheter advanced into position under fluoroscopic guidance. The catheter tip is positioned in the cavoatrial junction. This was documented with a spot image. The portacatheter was then tested and found to flush and aspirate well. The port was flushed with saline followed by 100 units/mL heparinized saline. The pocket was then closed in two layers using first subdermal inverted interrupted absorbable sutures followed by a running subcuticular suture. The epidermis was then sealed with Dermabond. The dermatotomy at the venous access site was also seal with Dermabond.  Steri-Strips were placed. We then proceeded with gastrostomy tube placement. The epigastrium was prepped with Betadine in a sterile fashion, and a sterile drape was applied covering the operative field. A sterile gown and sterile gloves were used for the procedure. A 5-French orogastric tube is placed under fluoroscopic guidance. Scout imaging of the abdomen confirms barium within the transverse colon. The stomach was distended with gas. Under fluoroscopic guidance,  an 52 gauge needle was utilized to puncture the anterior wall of the body of the stomach. An Amplatz wire was advanced through the needle passing a T fastener into the lumen of the stomach. The T fastener was secured for gastropexy. A 9-French sheath was inserted. A snare was advanced through the 9-French sheath. A Britta Mccreedy was advanced through the orogastric tube. It was snared then pulled out the oral cavity, pulling the snare, as well. The leading edge of the gastrostomy was attached to the snare. It was then pulled down the esophagus and out the percutaneous site. It was secured in place. Contrast was injected. No complication Patient tolerated the procedure well and remained hemodynamically stable throughout. No complications encountered and no significant blood loss encountered IMPRESSION: Status post image guided right IJ port catheter and percutaneous gastrostomy. Signed, Dulcy Fanny. Dellia Nims, [facility] Radiology Electronically Signed   By: Corrie Mckusick D.O.   On: 09/11/2021 13:22    All questions were answered. The patient knows to call the clinic with any problems, questions or concerns. I spent 30 minutes in the care of this patient including H and P, review of records, counseling and coordination of care.     Benay Pike, MD 10/07/2021 5:02 PM

## 2021-10-07 NOTE — Assessment & Plan Note (Signed)
He has gained 5 pounds since his last visit.  Continue G-tube feeding as recommended.  We will continue to monitor weight

## 2021-10-07 NOTE — Assessment & Plan Note (Signed)
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  He is here before cycle 3 of cisplatin. Since we will dose reduce cisplatin to 30 mg per metered squared, tinnitus has significantly improved. Okay to plan with the same dose if labs are within parameters.  Clinically he has significant response with total resolution of cervical lymphadenopathy and improvement in oropharyngeal tumor.

## 2021-10-08 ENCOUNTER — Inpatient Hospital Stay: Payer: Commercial Managed Care - PPO | Admitting: Dietician

## 2021-10-08 ENCOUNTER — Inpatient Hospital Stay: Payer: Commercial Managed Care - PPO

## 2021-10-08 ENCOUNTER — Ambulatory Visit
Admission: RE | Admit: 2021-10-08 | Discharge: 2021-10-08 | Disposition: A | Discharge status: Home / Self Care | Payer: Commercial Managed Care - PPO | Source: Ambulatory Visit | Attending: Radiation Oncology | Admitting: Radiation Oncology

## 2021-10-08 ENCOUNTER — Other Ambulatory Visit: Payer: Self-pay

## 2021-10-08 VITALS — BP 128/94 | HR 93 | Temp 98.7°F | Resp 18

## 2021-10-08 DIAGNOSIS — C090 Malignant neoplasm of tonsillar fossa: Secondary | ICD-10-CM

## 2021-10-08 DIAGNOSIS — Z5111 Encounter for antineoplastic chemotherapy: Secondary | ICD-10-CM | POA: Diagnosis not present

## 2021-10-08 LAB — RAD ONC ARIA SESSION SUMMARY
Course Elapsed Days: 21
Plan Fractions Treated to Date: 16
Plan Prescribed Dose Per Fraction: 2 Gy
Plan Total Fractions Prescribed: 35
Plan Total Prescribed Dose: 70 Gy
Reference Point Dosage Given to Date: 32 Gy
Reference Point Session Dosage Given: 2 Gy
Session Number: 16

## 2021-10-08 MED ORDER — SODIUM CHLORIDE 0.9 % IV SOLN
30.0000 mg/m2 | Freq: Once | INTRAVENOUS | Status: AC
  Administered 2021-10-08: 59 mg via INTRAVENOUS
  Filled 2021-10-08: qty 59

## 2021-10-08 MED ORDER — MAGNESIUM SULFATE 2 GM/50ML IV SOLN
2.0000 g | Freq: Once | INTRAVENOUS | Status: AC
  Administered 2021-10-08: 2 g via INTRAVENOUS
  Filled 2021-10-08: qty 50

## 2021-10-08 MED ORDER — POTASSIUM CHLORIDE IN NACL 20-0.9 MEQ/L-% IV SOLN
Freq: Once | INTRAVENOUS | Status: AC
  Filled 2021-10-08: qty 1000

## 2021-10-08 MED ORDER — PALONOSETRON HCL INJECTION 0.25 MG/5ML
0.2500 mg | Freq: Once | INTRAVENOUS | Status: AC
  Administered 2021-10-08: 0.25 mg via INTRAVENOUS
  Filled 2021-10-08: qty 5

## 2021-10-08 MED ORDER — SODIUM CHLORIDE 0.9% FLUSH
10.0000 mL | INTRAVENOUS | Status: DC | PRN
  Administered 2021-10-08: 10 mL

## 2021-10-08 MED ORDER — SODIUM CHLORIDE 0.9 % IV SOLN
150.0000 mg | Freq: Once | INTRAVENOUS | Status: AC
  Administered 2021-10-08: 150 mg via INTRAVENOUS
  Filled 2021-10-08: qty 150

## 2021-10-08 MED ORDER — SODIUM CHLORIDE 0.9 % IV SOLN: Freq: Once | INTRAVENOUS | Status: AC

## 2021-10-08 MED ORDER — HEPARIN SOD (PORK) LOCK FLUSH 100 UNIT/ML IV SOLN
500.0000 [IU] | Freq: Once | INTRAVENOUS | Status: AC | PRN
  Administered 2021-10-08: 500 [IU]

## 2021-10-08 MED ORDER — SODIUM CHLORIDE 0.9 % IV SOLN
10.0000 mg | Freq: Once | INTRAVENOUS | Status: AC
  Administered 2021-10-08: 10 mg via INTRAVENOUS
  Filled 2021-10-08: qty 10

## 2021-10-08 NOTE — Patient Instructions (Signed)
[facility] MEDICAL ONCOLOGY  Discharge Instructions: Thank you for choosing [facility] to provide your oncology and hematology care.   If you have a lab appointment with the Cancer Center, please go directly to the Cancer Center and check in at the registration area.   Wear comfortable clothing and clothing appropriate for easy access to any Portacath or PICC line.   We strive to give you quality time with your provider. You may need to reschedule your appointment if you arrive late (15 or more minutes).  Arriving late affects you and other patients whose appointments are after yours.  Also, if you miss three or more appointments without notifying the office, you may be dismissed from the clinic at the provider's discretion.      For prescription refill requests, have your pharmacy contact our office and allow 72 hours for refills to be completed.    Today you received the following chemotherapy and/or immunotherapy agents : Cisplatin    To help prevent nausea and vomiting after your treatment, we encourage you to take your nausea medication as directed.  BELOW ARE SYMPTOMS THAT SHOULD BE REPORTED IMMEDIATELY: *FEVER GREATER THAN 100.4 F (38 C) OR HIGHER *CHILLS OR SWEATING *NAUSEA AND VOMITING THAT IS NOT CONTROLLED WITH YOUR NAUSEA MEDICATION *UNUSUAL SHORTNESS OF BREATH *UNUSUAL BRUISING OR BLEEDING *URINARY PROBLEMS (pain or burning when urinating, or frequent urination) *BOWEL PROBLEMS (unusual diarrhea, constipation, pain near the anus) TENDERNESS IN MOUTH AND THROAT WITH OR WITHOUT PRESENCE OF ULCERS (sore throat, sores in mouth, or a toothache) UNUSUAL RASH, SWELLING OR PAIN  UNUSUAL VAGINAL DISCHARGE OR ITCHING   Items with * indicate a potential emergency and should be followed up as soon as possible or go to the Emergency Department if any problems should occur.  Please show the CHEMOTHERAPY ALERT CARD or IMMUNOTHERAPY ALERT CARD at check-in to  the Emergency Department and triage nurse.  Should you have questions after your visit or need to cancel or reschedule your appointment, please contact [facility] MEDICAL ONCOLOGY  Dept: [phone]  and follow the prompts.  Office hours are 8:00 a.m. to 4:30 p.m. Monday - Friday. Please note that voicemails left after 4:00 p.m. may not be returned until the following business day.  We are closed weekends and major holidays. You have access to a nurse at all times for urgent questions. Please call the main number to the clinic Dept: [phone] and follow the prompts.   For any non-urgent questions, you may also contact your provider using MyChart. We now offer e-Visits for anyone 18 and older to request care online for non-urgent symptoms. For details visit [email or web address].   Also download the MyChart app! Go to the app store, search "MyChart", open the app, select [facility], and log in with your MyChart username and password.  Due to Covid, a mask is required upon entering the hospital/clinic. If you do not have a mask, one will be given to you upon arrival. For doctor visits, patients may have 1 support person aged 18 or older with them. For treatment visits, patients cannot have anyone with them due to current Covid guidelines and our immunocompromised population.   

## 2021-10-08 NOTE — Progress Notes (Signed)
Nutrition Follow-up:  Patient with tonsil cancer. He is receiving concurrent chemoradiation with weekly cisplatin. Dose reduced starting C2 due to tinnitus.   Met with patient during infusion. He reports feeling terrible. He is unable to talk much due to pain from his mouth sores. He has not started baking soda salt water rinses. Patient reports hycet is not working and unable to swallow his pain medication. Patient purchased a pill crusher so he can give meds via tube. He has had episodes of nausea with vomiting in the last week. Patient is not eating/drinking orally. Patient awaiting for delivery of Dillard [place]. Reports using all of the sample formula. He is currently putting Ensure in his tube.    Medications: Hycet, carafate   Labs: glucose 103, BUN 23  Anthropometrics: Weight 157 lb 6.4 oz on 5/23 increased   5/16 - 151 lb 8 oz 5/09 - 156 lb 9.6 oz 5/02 - 159 lb 1.6 oz  Estimated Energy Needs  Kcals: 4076-8088 Protein: 120-134 Fluid: 2.6 L  [employer] 1.4 -6 cartons via tube split over four feedings. Flush tube with 90 ml water before and after feedings. Drink by mouth or give via tube additional 2 cups water. This provides 2730 kcal, 120 grams protein, 1404 ml free water (2598 ml total water with flushes). Meets 100% needs     NUTRITION DIAGNOSIS: Inadequate oral intake ongoing, pt supplementing with tube feedings  INTERVENTION:  Provided recipe for baking soda salt water rinses, instructed him to rinse several times daily Contacted [employer] for update on formula/supplies. Rep reports waiting on signed EOB from pt insurance company prior to shipping. Rep will provide further update as able Will provide one case of [employer] [place] 1.4  Patient has contact information    MONITORING, EVALUATION, GOAL: weight trends, intake, tube feedings   NEXT VISIT: Wednesday May 31 in infusion

## 2021-10-09 ENCOUNTER — Other Ambulatory Visit: Payer: Self-pay

## 2021-10-09 ENCOUNTER — Ambulatory Visit
Admission: RE | Admit: 2021-10-09 | Discharge: 2021-10-09 | Disposition: A | Discharge status: Home / Self Care | Payer: Commercial Managed Care - PPO | Source: Ambulatory Visit | Attending: Radiation Oncology | Admitting: Radiation Oncology

## 2021-10-09 DIAGNOSIS — Z5111 Encounter for antineoplastic chemotherapy: Secondary | ICD-10-CM | POA: Diagnosis not present

## 2021-10-09 LAB — RAD ONC ARIA SESSION SUMMARY
Course Elapsed Days: 22
Plan Fractions Treated to Date: 17
Plan Prescribed Dose Per Fraction: 2 Gy
Plan Total Fractions Prescribed: 35
Plan Total Prescribed Dose: 70 Gy
Reference Point Dosage Given to Date: 34 Gy
Reference Point Session Dosage Given: 2 Gy
Session Number: 17

## 2021-10-10 ENCOUNTER — Ambulatory Visit
Admission: RE | Admit: 2021-10-10 | Discharge: 2021-10-10 | Disposition: A | Discharge status: Home / Self Care | Payer: Commercial Managed Care - PPO | Source: Ambulatory Visit | Attending: Radiation Oncology | Admitting: Radiation Oncology

## 2021-10-10 ENCOUNTER — Other Ambulatory Visit: Payer: Self-pay

## 2021-10-10 DIAGNOSIS — Z5111 Encounter for antineoplastic chemotherapy: Secondary | ICD-10-CM | POA: Diagnosis not present

## 2021-10-10 LAB — RAD ONC ARIA SESSION SUMMARY
Course Elapsed Days: 23
Plan Fractions Treated to Date: 18
Plan Prescribed Dose Per Fraction: 2 Gy
Plan Total Fractions Prescribed: 35
Plan Total Prescribed Dose: 70 Gy
Reference Point Dosage Given to Date: 36 Gy
Reference Point Session Dosage Given: 2 Gy
Session Number: 18

## 2021-10-14 ENCOUNTER — Other Ambulatory Visit: Payer: Self-pay

## 2021-10-14 ENCOUNTER — Encounter: Payer: Self-pay | Admitting: Hematology and Oncology

## 2021-10-14 ENCOUNTER — Ambulatory Visit: Payer: Commercial Managed Care - PPO | Attending: Radiation Oncology

## 2021-10-14 ENCOUNTER — Inpatient Hospital Stay: Payer: Commercial Managed Care - PPO

## 2021-10-14 ENCOUNTER — Ambulatory Visit
Admission: RE | Admit: 2021-10-14 | Discharge: 2021-10-14 | Disposition: A | Discharge status: Home / Self Care | Payer: Commercial Managed Care - PPO | Source: Ambulatory Visit | Attending: Radiation Oncology | Admitting: Radiation Oncology

## 2021-10-14 ENCOUNTER — Inpatient Hospital Stay (HOSPITAL_BASED_OUTPATIENT_CLINIC_OR_DEPARTMENT_OTHER): Payer: Commercial Managed Care - PPO | Admitting: Hematology and Oncology

## 2021-10-14 DIAGNOSIS — H9311 Tinnitus, right ear: Secondary | ICD-10-CM | POA: Diagnosis not present

## 2021-10-14 DIAGNOSIS — Z95828 Presence of other vascular implants and grafts: Secondary | ICD-10-CM

## 2021-10-14 DIAGNOSIS — C090 Malignant neoplasm of tonsillar fossa: Secondary | ICD-10-CM

## 2021-10-14 DIAGNOSIS — K1231 Oral mucositis (ulcerative) due to antineoplastic therapy: Secondary | ICD-10-CM | POA: Diagnosis not present

## 2021-10-14 DIAGNOSIS — C113 Malignant neoplasm of anterior wall of nasopharynx: Secondary | ICD-10-CM | POA: Insufficient documentation

## 2021-10-14 DIAGNOSIS — Z5111 Encounter for antineoplastic chemotherapy: Secondary | ICD-10-CM | POA: Diagnosis not present

## 2021-10-14 DIAGNOSIS — R634 Abnormal weight loss: Secondary | ICD-10-CM | POA: Diagnosis not present

## 2021-10-14 DIAGNOSIS — C108 Malignant neoplasm of overlapping sites of oropharynx: Secondary | ICD-10-CM | POA: Insufficient documentation

## 2021-10-14 LAB — RAD ONC ARIA SESSION SUMMARY
Course Elapsed Days: 27
Plan Fractions Treated to Date: 19
Plan Prescribed Dose Per Fraction: 2 Gy
Plan Total Fractions Prescribed: 35
Plan Total Prescribed Dose: 70 Gy
Reference Point Dosage Given to Date: 38 Gy
Reference Point Session Dosage Given: 2 Gy
Session Number: 19

## 2021-10-14 LAB — CBC WITH DIFFERENTIAL (CANCER CENTER ONLY)
Abs Immature Granulocytes: 0.02 10*3/uL (ref 0.00–0.07)
Basophils Absolute: 0 10*3/uL (ref 0.0–0.1)
Basophils Relative: 0 %
Eosinophils Absolute: 0 10*3/uL (ref 0.0–0.5)
Eosinophils Relative: 1 %
HCT: 30.4 % — ABNORMAL LOW (ref 39.0–52.0)
Hemoglobin: 10.7 g/dL — ABNORMAL LOW (ref 13.0–17.0)
Immature Granulocytes: 1 %
Lymphocytes Relative: 13 %
Lymphs Abs: 0.4 10*3/uL — ABNORMAL LOW (ref 0.7–4.0)
MCH: 31.6 pg (ref 26.0–34.0)
MCHC: 35.2 g/dL (ref 30.0–36.0)
MCV: 89.7 fL (ref 80.0–100.0)
Monocytes Absolute: 0.5 10*3/uL (ref 0.1–1.0)
Monocytes Relative: 13 %
Neutro Abs: 2.5 10*3/uL (ref 1.7–7.7)
Neutrophils Relative %: 72 %
Platelet Count: 165 10*3/uL (ref 150–400)
RBC: 3.39 MIL/uL — ABNORMAL LOW (ref 4.22–5.81)
RDW: 13.1 % (ref 11.5–15.5)
WBC Count: 3.5 10*3/uL — ABNORMAL LOW (ref 4.0–10.5)
nRBC: 0 % (ref 0.0–0.2)

## 2021-10-14 LAB — BASIC METABOLIC PANEL - CANCER CENTER ONLY
Anion gap: 5 (ref 5–15)
BUN: 22 mg/dL — ABNORMAL HIGH (ref 6–20)
CO2: 30 mmol/L (ref 22–32)
Calcium: 9.1 mg/dL (ref 8.9–10.3)
Chloride: 100 mmol/L (ref 98–111)
Creatinine: 0.64 mg/dL (ref 0.61–1.24)
GFR, Estimated: >60 mL/min (ref >60)
Glucose, Bld: 96 mg/dL (ref 70–99)
Potassium: 4.2 mmol/L (ref 3.5–5.1)
Sodium: 135 mmol/L (ref 135–145)

## 2021-10-14 LAB — MAGNESIUM: Magnesium: 2.2 mg/dL (ref 1.7–2.4)

## 2021-10-14 MED ORDER — SODIUM CHLORIDE 0.9% FLUSH
10.0000 mL | Freq: Once | INTRAVENOUS | Status: AC
  Administered 2021-10-14: 10 mL

## 2021-10-14 MED ORDER — HYDROCODONE-ACETAMINOPHEN 5-325 MG PO TABS
1.0000 | ORAL_TABLET | Freq: Four times a day (QID) | ORAL | 0 refills | Status: DC | PRN
Start: 2021-10-14 — End: 2021-10-24

## 2021-10-14 MED FILL — Fosaprepitant Dimeglumine For IV Infusion 150 MG (Base Eq): INTRAVENOUS | Qty: 5 | Status: AC

## 2021-10-14 MED FILL — Dexamethasone Sodium Phosphate Inj 100 MG/10ML: INTRAMUSCULAR | Qty: 1 | Status: AC

## 2021-10-14 NOTE — Assessment & Plan Note (Signed)
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  I have also dose reduce cisplatin to 30 mg per metered square since he reported worsening tinnitus which since then has not worsened.

## 2021-10-14 NOTE — Assessment & Plan Note (Signed)
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  He is here before cycle 5 of cisplatin. He denies any new health complaints except for ongoing severe sore throat.  He could not tolerate the liquid hydrocodone.  He was hoping to get pill refills.  He will bring in a liquid prescription for safe disposal. Okay to proceed with chemotherapy tomorrow if labs are all within parameters

## 2021-10-14 NOTE — Progress Notes (Signed)
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  He is here before cycle 5 of cisplatin. He denies any new health complaints except for ongoing severe sore throat.  He could not tolerate the liquid hydrocodone.  He was hoping to get pill refills.  He will bring in a liquid prescription for safe disposal. Okay to proceed with chemotherapy tomorrow if labs are all within parameters  Mucositis due to antineoplastic therapy Mucositis from treatment, severe. He will continue oral Vicodin for management of severe pain.  Weight loss, unintentional Weight has stabilized since last visit.  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  I have also dose reduce cisplatin to 30 mg per metered square since he reported worsening tinnitus which since then has not worsened.   No orders of the defined types were placed in this encounter.  Total time spent: 30 minutes  HISTORY OF PRESENTING ILLNESS:  Johnny Mata 46 y.o. 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  Oncology History  Primary cancer of tonsillar fossa ([place])  08/19/2021 Imaging   Johnny complained of spitting chunks of tissue hence had a CT maxillofacial with contrast as well as with the neck with contrast.  Mucosal  hyperenhancement in the right nasopharynx extending to the oropharynx with mostly soft tissue density in the region of right palatine tonsil most suspicious for malignancy.  There is also a 1.8 x 1.5 x 2.4 cm peripherally enhancing hyperintense lesion in the right retropharynx suspicious for necrotic lymph node with additional prominent right level 2 lymph nodes measuring up to 1.3 cm in short axis with suspected extracapsular extension at level to me.   08/27/2021 Initial Diagnosis   Primary cancer of tonsillar fossa ([place])    09/01/2021 PET scan   PET scan from April 17 showed locally advanced right palatine tonsil primary with ipsilateral cervical nodal metastasis no evidence of hypermetabolic extracervical metastasis.  Tiny pulmonary nodules below PET resolution consider chest CT follow-up at 6 months   09/03/2021 Cancer Staging   Staging form: Pharynx - P16 Negative Oropharynx, AJCC 8th Edition - Clinical stage from 09/03/2021: Stage IVA (cT3, cN2b, cM0, p16-) - Signed by Eppie Gibson, MD on 09/03/2021 Stage prefix: Initial diagnosis    09/17/2021 -  Chemotherapy   Johnny is on Treatment Plan : HEAD/NECK Cisplatin q7d       He started cycle 1 on 09/17/2021.    Interval History  Johnny Mata is here for follow up before planned C5 of chemotherapy. Most important bothersome complaint is severe sorethroat. He cant tolerate the liquid , makes him sick, He would rather take the pill Ringing noise in right ear is stable. HE had baseline tinnitus from air craft work. No change in breathing. Bowels are moving well. No tingling or numbness of hands/feet Urine pale colored. No weight loss Rest of the pertinent 10 point ROS reviewed and negative  MEDICAL HISTORY:  Past Medical History:  Diagnosis Date   Anxiety    Never officially diagnosed; does not take medication for anxiety   Asthma    Hasn't had an episode since childhood   Depression     SURGICAL HISTORY: Past Surgical History:   Procedure Laterality Date   ELBOW SURGERY Bilateral    HIP SURGERY Left    IR GASTROSTOMY TUBE MOD SED  09/11/2021   IR IMAGING GUIDED PORT INSERTION  09/11/2021   NECK SURGERY  05/09/2018   Cervical fusion (Dr. Atilano Ina)   PANENDOSCOPY N/A 08/19/2021   Procedure: PANENDOSCOPY WITH BIPOSY--direct laryngoscopy, flexible bronchoscopy, rigid esophagoscopy, biopsy of lesion in throat;  Surgeon: Marcina Millard, MD;  Location: [place];  Service: ENT;  Laterality: N/A;   PLANTAR FASCIA SURGERY     SHOULDER SURGERY Left    TONSILLECTOMY     WISDOM TOOTH EXTRACTION Bilateral 1995    SOCIAL HISTORY: Social History   Socioeconomic History   Marital status: Single    Spouse name: Not on file   Number of children: Not on file   Years of education: Not on file   Highest education level: Not on file  Occupational History   Not on file  Tobacco Use   Smoking status: Former    Packs/day: 1.50    Years: 20.00    Pack years: 30.00    Types: Cigarettes    Quit date: 10/02/2019    Years since quitting: 2.0   Smokeless tobacco: Never  Vaping Use   Vaping Use: Never used  Substance and Sexual Activity   Alcohol use: Not Currently    Alcohol/week: 7.0 standard drinks    Types: 7 Shots of liquor per week   Drug use: Yes    Types: Marijuana   Sexual activity: Not Currently  Other Topics Concern   Not on file  Social History Narrative   Not on file   Social Determinants of Health   Financial Resource Strain: Medium Risk   Difficulty of Paying Living Expenses: Somewhat hard  Food Insecurity: Not on file  Transportation Needs: No Transportation Needs   Lack of Transportation (Medical): No   Lack of Transportation (Non-Medical): No  Physical Activity: Not on file  Stress: Not on file  Social Connections: Not on file  Intimate Partner Violence: Not on file    FAMILY HISTORY: Family History  Problem Relation Age of Onset   Colon cancer Mother     ALLERGIES:  is allergic  to dust mite extract, other, and pollen extract-tree extract [pollen extract].  MEDICATIONS:  Current Outpatient Medications  Medication Sig Dispense Refill   HYDROcodone-acetaminophen (NORCO/VICODIN) 5-325 MG tablet Take 1-2 tablets by mouth every 6 (six) hours as needed for moderate pain. 60 tablet 0   dexamethasone (DECADRON) 4 MG tablet Take 2 tablets (8 mg total) by mouth daily. Take daily x 3 days starting the day after cisplatin chemotherapy. Take with food. 30 tablet 1   diphenhydrAMINE (BENADRYL) 25 mg capsule Take 1 capsule (25 mg total) by mouth daily as needed for sleep. 30 capsule 0   ibuprofen (ADVIL) 200 MG tablet Take 400 mg by mouth See admin instructions. Pt states they are taking 400 mg every 2-3 hours as needed.     Ibuprofen-diphenhydrAMINE Cit (IBUPROFEN PM PO) Take 2 tablets by mouth at bedtime.     lidocaine (XYLOCAINE) 2 % solution Johnny: Mix 1part 2% viscous lidocaine, 1part H20. Swish & swallow 29m of diluted mixture,  88mn before meals and at bedtime, up to QID 200 mL 3   lidocaine-prilocaine (EMLA) cream Apply to affected area once 30 g 3   Nutritional Supplements (KATE FARMS STANDARD 1.4) LIQD 6 cartons via tube split over four feedings. Flush tube with 90 ml water before and after feedings. Drink by mouth or give via tube additional 2 cups water. This provides 2730 kcal, 120 grams protein, 1404 ml free water (2598 ml total water with flushes). Meets 100% needs      ondansetron (ZOFRAN) 8 MG tablet Take 1 tablet (8 mg total) by mouth 2 (two) times daily as needed. Start on the third day after cisplatin chemotherapy. 30 tablet 1   prochlorperazine (COMPAZINE) 10 MG tablet Take 1 tablet (10 mg total) by mouth every 6 (six) hours as needed (Nausea or vomiting). 30 tablet 1   sertraline (ZOLOFT) 100 MG tablet Take 100 mg by mouth daily.     sucralfate (CARAFATE) 1 g tablet Dissolve 1 tablet in 10 mL H20 and swallow 10-20 min prior to meals and bedtime up to QID PRN  soreness. 40 tablet 3   traZODone (DESYREL) 50 MG tablet Take 50 mg by mouth at bedtime. Able to take 50-'200mg'$  as needed for sleep     No current facility-administered medications for this visit.     PHYSICAL EXAMINATION: ECOG PERFORMANCE STATUS: 0 - Asymptomatic  Vitals:   10/14/21 1507  BP: 131/80  Pulse: 91  Resp: 18  Temp: 98.2 F (36.8 C)  SpO2: 100%     Filed Weights   10/14/21 1507  Weight: 159 lb 11.2 oz (72.4 kg)    GENERAL:alert, no distress and comfortable Neck: Right cervical lymphadenopathy significantly improved. Skin: Radiation changes noted. Lower extremities: No lower extremity edema   LABORATORY DATA:  I have reviewed the data as listed Lab Results  Component Value Date   WBC 3.5 (L) 10/14/2021   HGB 10.7 (L) 10/14/2021   HCT 30.4 (L) 10/14/2021   MCV 89.7 10/14/2021   PLT 165 10/14/2021     Chemistry      Component Value Date/Time   NA 135 10/14/2021 1500   K 4.2 10/14/2021 1500   CL 100 10/14/2021 1500   CO2 30 10/14/2021 1500   BUN 22 (H) 10/14/2021 1500   CREATININE 0.64 10/14/2021 1500      Component Value Date/Time   CALCIUM 9.1 10/14/2021 1500   ALKPHOS 57 08/19/2021 1012   AST 16 08/19/2021 1012   ALT 12 08/19/2021 1012   BILITOT 0.5 08/19/2021 1012       RADIOGRAPHIC STUDIES: I have personally reviewed the radiological images as listed and agreed with the findings in the report. No results found.  All questions were answered. The Johnny knows to call the clinic with any problems, questions or concerns    PBenay Pike MD 10/14/2021 3:36 PM

## 2021-10-14 NOTE — Assessment & Plan Note (Signed)
Weight has stabilized since last visit.  He will continue G-tube feeding

## 2021-10-14 NOTE — Assessment & Plan Note (Signed)
Mucositis from treatment, severe. He will continue oral Vicodin for management of severe pain.

## 2021-10-15 ENCOUNTER — Inpatient Hospital Stay: Payer: Commercial Managed Care - PPO | Admitting: Dietician

## 2021-10-15 ENCOUNTER — Other Ambulatory Visit: Payer: Self-pay

## 2021-10-15 ENCOUNTER — Ambulatory Visit
Admission: RE | Admit: 2021-10-15 | Discharge: 2021-10-15 | Disposition: A | Discharge status: Home / Self Care | Payer: Commercial Managed Care - PPO | Source: Ambulatory Visit | Attending: Radiation Oncology | Admitting: Radiation Oncology

## 2021-10-15 ENCOUNTER — Inpatient Hospital Stay: Payer: Commercial Managed Care - PPO

## 2021-10-15 VITALS — BP 132/78 | HR 89 | Temp 98.2°F | Resp 18 | Wt 159.7 lb

## 2021-10-15 DIAGNOSIS — C090 Malignant neoplasm of tonsillar fossa: Secondary | ICD-10-CM

## 2021-10-15 DIAGNOSIS — Z5111 Encounter for antineoplastic chemotherapy: Secondary | ICD-10-CM | POA: Diagnosis not present

## 2021-10-15 LAB — RAD ONC ARIA SESSION SUMMARY
Course Elapsed Days: 28
Plan Fractions Treated to Date: 20
Plan Prescribed Dose Per Fraction: 2 Gy
Plan Total Fractions Prescribed: 35
Plan Total Prescribed Dose: 70 Gy
Reference Point Dosage Given to Date: 40 Gy
Reference Point Session Dosage Given: 2 Gy
Session Number: 20

## 2021-10-15 MED ORDER — SODIUM CHLORIDE 0.9 % IV SOLN: Freq: Once | INTRAVENOUS | Status: AC

## 2021-10-15 MED ORDER — SODIUM CHLORIDE 0.9 % IV SOLN
10.0000 mg | Freq: Once | INTRAVENOUS | Status: AC
  Administered 2021-10-15: 10 mg via INTRAVENOUS
  Filled 2021-10-15: qty 10

## 2021-10-15 MED ORDER — HEPARIN SOD (PORK) LOCK FLUSH 100 UNIT/ML IV SOLN
500.0000 [IU] | Freq: Once | INTRAVENOUS | Status: AC | PRN
  Administered 2021-10-15: 500 [IU]

## 2021-10-15 MED ORDER — MAGNESIUM SULFATE 2 GM/50ML IV SOLN
2.0000 g | Freq: Once | INTRAVENOUS | Status: AC
  Administered 2021-10-15: 2 g via INTRAVENOUS
  Filled 2021-10-15: qty 50

## 2021-10-15 MED ORDER — SODIUM CHLORIDE 0.9 % IV SOLN
150.0000 mg | Freq: Once | INTRAVENOUS | Status: AC
  Administered 2021-10-15: 150 mg via INTRAVENOUS
  Filled 2021-10-15: qty 150

## 2021-10-15 MED ORDER — POTASSIUM CHLORIDE IN NACL 20-0.9 MEQ/L-% IV SOLN
Freq: Once | INTRAVENOUS | Status: AC
  Filled 2021-10-15: qty 1000

## 2021-10-15 MED ORDER — SODIUM CHLORIDE 0.9 % IV SOLN
30.0000 mg/m2 | Freq: Once | INTRAVENOUS | Status: AC
  Administered 2021-10-15: 59 mg via INTRAVENOUS
  Filled 2021-10-15: qty 59

## 2021-10-15 MED ORDER — SODIUM CHLORIDE 0.9% FLUSH
10.0000 mL | INTRAVENOUS | Status: DC | PRN
  Administered 2021-10-15: 10 mL

## 2021-10-15 MED ORDER — PALONOSETRON HCL INJECTION 0.25 MG/5ML
0.2500 mg | Freq: Once | INTRAVENOUS | Status: AC
  Administered 2021-10-15: 0.25 mg via INTRAVENOUS
  Filled 2021-10-15: qty 5

## 2021-10-15 NOTE — Patient Instructions (Signed)
[facility] MEDICAL ONCOLOGY  Discharge Instructions: Thank you for choosing [facility] to provide your oncology and hematology care.   If you have a lab appointment with the Cancer Center, please go directly to the Cancer Center and check in at the registration area.   Wear comfortable clothing and clothing appropriate for easy access to any Portacath or PICC line.   We strive to give you quality time with your provider. You may need to reschedule your appointment if you arrive late (15 or more minutes).  Arriving late affects you and other patients whose appointments are after yours.  Also, if you miss three or more appointments without notifying the office, you may be dismissed from the clinic at the provider's discretion.      For prescription refill requests, have your pharmacy contact our office and allow 72 hours for refills to be completed.    Today you received the following chemotherapy and/or immunotherapy agents : Cisplatin    To help prevent nausea and vomiting after your treatment, we encourage you to take your nausea medication as directed.  BELOW ARE SYMPTOMS THAT SHOULD BE REPORTED IMMEDIATELY: *FEVER GREATER THAN 100.4 F (38 C) OR HIGHER *CHILLS OR SWEATING *NAUSEA AND VOMITING THAT IS NOT CONTROLLED WITH YOUR NAUSEA MEDICATION *UNUSUAL SHORTNESS OF BREATH *UNUSUAL BRUISING OR BLEEDING *URINARY PROBLEMS (pain or burning when urinating, or frequent urination) *BOWEL PROBLEMS (unusual diarrhea, constipation, pain near the anus) TENDERNESS IN MOUTH AND THROAT WITH OR WITHOUT PRESENCE OF ULCERS (sore throat, sores in mouth, or a toothache) UNUSUAL RASH, SWELLING OR PAIN  UNUSUAL VAGINAL DISCHARGE OR ITCHING   Items with * indicate a potential emergency and should be followed up as soon as possible or go to the Emergency Department if any problems should occur.  Please show the CHEMOTHERAPY ALERT CARD or IMMUNOTHERAPY ALERT CARD at check-in to  the Emergency Department and triage nurse.  Should you have questions after your visit or need to cancel or reschedule your appointment, please contact [facility]ICAL [facility]  Dept: [phone]  and follow the prompts.  Office hours are 8:00 a.m. to 4:30 p.m. Monday - Friday. Please note that voicemails left after 4:00 p.m. may not be returned until the following business day.  We are closed weekends and major holidays. You have access to a nurse at all times for urgent questions. Please call the main number to the clinic Dept: [phone] and follow the prompts.   For any non-urgent questions, you may also contact your provider using MyChart. We now offer e-Visits for anyone 18 and older to request care online for non-urgent symptoms. For details visit [email or web address].   Also download the MyChart app! Go to the app store, search "MyChart", open the app, select [facility], and log in with your MyChart username and password.  Due to Covid, a mask is required upon entering the hospital/clinic. If you do not have a mask, one will be given to you upon arrival. For doctor visits, patients may have 1 support person aged 18 or older with them. For treatment visits, patients cannot have anyone with them due to current Covid guidelines and our immunocompromised population.   

## 2021-10-15 NOTE — Progress Notes (Signed)
Nutrition Follow-up:  Patient with tonsil cancer. He is receiving concurrent chemoradiation with weekly cisplatin. Dose reduced starting C2 due to tinnitus.   Met with patient in infusion. He reports significant mouth pain persists. Patient has been doing baking soda salt water rinses as well as sprite rinse several times daily. These have been helping. Patient unable to tolerate liquid pain medication. This makes him vomit. He reports drinking water by mouth. He is able to swallow pills without difficulty. Patient is not eating orally. He endorses tolerating tube feedings. Patient unsure how many he is doing daily. He is awaiting shipment of formula and supplies, reports this was shipped out yesterday. Patient requesting additional formula, syringes, split gauze today.    Medications: reviewed  Labs: BUN 22  Anthropometrics: Weight 159 lb 11 oz today increased   5/23 - 157 lb 6.4 oz 5/16 - 151 lb 8 oz 5/09 - 156 lb 9.6 oz 5/02 - 159 lb 1.6 oz  Estimated Energy Needs   Kcals: 2358-2625 Protein: 120-134 Fluid: 2.6 L  NUTRITION DIAGNOSIS: Inadequate oral intake ongoing, pt meeting nutrition/hydration needs with tube feedings   INTERVENTION:  Continue [employer] 1.4 - 6 cartons daily  Patient awaiting shipment from [employer], provided one case Nutren 1.5, syringes, mesh briefs, split gauze today Continue baking soda/salt water + sprite rinse several times daily Provided support and encouragement    MONITORING, EVALUATION, GOAL: weight trends, tube feeding   NEXT VISIT: Wednesday June 7

## 2021-10-16 ENCOUNTER — Other Ambulatory Visit: Payer: Self-pay

## 2021-10-16 ENCOUNTER — Ambulatory Visit
Admission: RE | Admit: 2021-10-16 | Discharge: 2021-10-16 | Disposition: A | Discharge status: Home / Self Care | Payer: Commercial Managed Care - PPO | Source: Ambulatory Visit | Attending: Radiation Oncology | Admitting: Radiation Oncology

## 2021-10-16 ENCOUNTER — Ambulatory Visit: Payer: Commercial Managed Care - PPO | Attending: Radiation Oncology

## 2021-10-16 DIAGNOSIS — R1310 Dysphagia, unspecified: Secondary | ICD-10-CM | POA: Insufficient documentation

## 2021-10-16 DIAGNOSIS — K1231 Oral mucositis (ulcerative) due to antineoplastic therapy: Secondary | ICD-10-CM | POA: Diagnosis not present

## 2021-10-16 DIAGNOSIS — C113 Malignant neoplasm of anterior wall of nasopharynx: Secondary | ICD-10-CM | POA: Insufficient documentation

## 2021-10-16 DIAGNOSIS — Z87891 Personal history of nicotine dependence: Secondary | ICD-10-CM | POA: Diagnosis not present

## 2021-10-16 DIAGNOSIS — G893 Neoplasm related pain (acute) (chronic): Secondary | ICD-10-CM | POA: Diagnosis not present

## 2021-10-16 DIAGNOSIS — Z931 Gastrostomy status: Secondary | ICD-10-CM | POA: Diagnosis not present

## 2021-10-16 DIAGNOSIS — K5900 Constipation, unspecified: Secondary | ICD-10-CM | POA: Diagnosis not present

## 2021-10-16 DIAGNOSIS — C090 Malignant neoplasm of tonsillar fossa: Secondary | ICD-10-CM | POA: Diagnosis present

## 2021-10-16 DIAGNOSIS — R634 Abnormal weight loss: Secondary | ICD-10-CM | POA: Diagnosis not present

## 2021-10-16 DIAGNOSIS — D72819 Decreased white blood cell count, unspecified: Secondary | ICD-10-CM | POA: Diagnosis not present

## 2021-10-16 DIAGNOSIS — Z5111 Encounter for antineoplastic chemotherapy: Secondary | ICD-10-CM | POA: Diagnosis present

## 2021-10-16 DIAGNOSIS — Z800 Family history of malignant neoplasm of digestive organs: Secondary | ICD-10-CM | POA: Diagnosis not present

## 2021-10-16 LAB — RAD ONC ARIA SESSION SUMMARY
Course Elapsed Days: 29
Plan Fractions Treated to Date: 21
Plan Prescribed Dose Per Fraction: 2 Gy
Plan Total Fractions Prescribed: 35
Plan Total Prescribed Dose: 70 Gy
Reference Point Dosage Given to Date: 42 Gy
Reference Point Session Dosage Given: 2 Gy
Session Number: 21

## 2021-10-16 NOTE — Therapy (Signed)
OUTPATIENT SPEECH LANGUAGE PATHOLOGY ONCOLOGY EVALUATION   Patient Name: Johnny Mata MRN: [id number] DOB:11-02-1975, 46 y.o., male Today's Date: 10/16/2021  PCP: No PCP REFERRING PROVIDER: Eppie Gibson, MD   End of Session - 10/16/21 1314     Visit Number 2    Number of Visits 4    Date for SLP Re-Evaluation 12/17/21    SLP Start Time 1308    Activity Tolerance Patient tolerated treatment well             Past Medical History:  Diagnosis Date   Anxiety    Never officially diagnosed; does not take medication for anxiety   Asthma    Hasn't had an episode since childhood   Depression    Past Surgical History:  Procedure Laterality Date   ELBOW SURGERY Bilateral    HIP SURGERY Left    IR GASTROSTOMY TUBE MOD SED  09/11/2021   IR IMAGING GUIDED PORT INSERTION  09/11/2021   NECK SURGERY  05/09/2018   Cervical fusion (Dr. 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  THERAPY DIAG:  Dysphagia, unspecified type  SUBJECTIVE:   SUBJECTIVE STATEMENT: Pt stated he has all but ceased eating solid PO due to dysgeusia, mainly. Pt accompanied by: self  PERTINENT HISTORY:  He presented to the ED 08/19/21 with severe pain on posterior roof of his mouth and endorsed coughing up grey chunks of tissue. 4/4 CT neck showed a mucosal hyperenhancement in the right nasopharynx extending to the oropharynx, with masslike soft tissue density in the region of the right palatine tonsil, suspicious for malignancy. CT also showed a 1.8 cm x 1.5 cm x 2.4 cm peripherally enhancing hypodense lesion in the lateral right retropharynx suspicious for a necrotic node, as well as additional prominent right level II lymph nodes measuring up to 1.3 cm in short axis with suspected extracapsular extension at level IIB. 4/4 Biopsy of the tonsillar fossa and soft palate revealed invasive poorly differentiated SCC. 4/5 CT chest showed no definitive signs of metastatic disease to his chest. 4/17 PET revealed locally advanced right palatine tonsil primary with ipsilateral cervical node metastasis, no evidence of hypermetabolic extra cervical metastasis, tiny pulmonary nodules, below PET resolution, consider chest CT follow up at 6 months. Consult with Dr. Isidore Moos on 4/12 and Dr. Chryl Heck on 4/21. He will receive weekly chemotherapy and 35 fractions of radiation to his Oropharynx and bilateral neck.  He started on 09/17/21 and will complete on  11/05/21. PAC & PEG 09/12/21. He has a history of smoking 1.5 packs per day and quit 09/2019. He uses marijuana daily (smoking and edibles). He denies alcohol consumption.  PAIN:  Are you having pain? Yes: NPRS scale: 10/10 Pain location: rt and lt lateral tongue; surface of tongue Pain description: throbbing, soreness Aggravating factors: swallowing Relieving factors: meds   PATIENT GOALS Maintain WNL swallowing. "Keep my throat from becoming a tree  trunk."  OBJECTIVE:   TODAY'S TREATMENT:  10-16-21 Pt reports dysgeusia is the major reason he is not having more solid PO. Rarely but especially with meds pt reported nasal regurgitation with thin liquid. SLP explained to pt why this might be happening and when pt took smaller sips and swallowed with effort he reported feeling like he would NOT experience nasal regurgitation. Pt did not have med nor SLP had pseudo-med to try this with but pt again stated he felt like he would not regurgitate with smaller sips and effortful swallow. Johnny Mata did not want to have any applesauce due to dysgeusia. With HEP, pt also politely refused (attempted but did not feel he could perform) [place]. He req'd consistent min A on the other exercises; Pt was not performing scope of HEP as directed. SLP reiterated that he should do at least 20 reps/day each exercise, at least 5 reps at a time. When pain in tongue subsides/reduces he should aim for 30 reps/day/each with at least 5 reps at a time. Pt told SLP 3 overt s/sx aspiration PNA with modified independence, and told SLP rationale fo HEP independently.  Eval date: Research states the risk for dysphagia increases due to radiation and/or chemotherapy treatment due to a variety of factors, so SLP educated the pt about the possibility of reduced/limited ability for PO intake during rad tx. SLP also educated pt regarding possible changes to swallowing musculature after rad tx, and why adherence to dysphagia HEP provided today and PO consumption was necessary to inhibit muscle fibrosis following rad tx and to mitigate muscle disuse atrophy. SLP informed pt why this would be detrimental to their swallowing status and to their pulmonary health. Pt demonstrated understanding of these things to SLP. SLP encouraged pt to safely eat and drink as deep into their radiation/chemotherapy as possible to provide the best possible long-term swallowing outcome for pt.    SLP then developed an  individualized HEP for pt involving oral and pharyngeal strengthening and ROM and pt was instructed how to perform these exercises, including SLP demonstration. After SLP demonstration, pt return demonstrated each exercise. SLP ensured pt performance was correct prior to educating pt on next exercise. Pt required min cues faded to modified independent to perform HEP. Pt was instructed to complete this program 6-7 days/week, at least 2 times a day until 6 months after his or her last day of rad tx, and then x2 a week after that, indefinitely. Among other modifications for days when pt cannot functionally swallow, SLP also suggested pt to perform only non-swallowing tasks on the handout/HEP, and if necessary to cycle through the swallowing portion so the full program of exercises can be completed instead of fatiguing on one of the swallowing exercises and being unable to perform the other swallowing exercises. SLP instructed that swallowing exercises should then be added back into the regimen as pt is able to do so. Secondly, pt was told that former patients have told SLP that during their course of radiation therapy, taking prescribed pain medication just prior to performing HEP (and  eating/drinking) has proven helpful in completing HEP (and eating and drinking) more regularly when going through their course of radiation treatment.    PATIENT EDUCATION: Education details: late effects head/neck radiation on swallow function, HEP procedure, and modification to HEP when difficulty experienced with swallowing during and after radiation course Person educated: Patient Education method: Explanation, Demonstration, Verbal cues, and Handouts Education comprehension: verbalized understanding, returned demonstration, verbal cues required, and needs further education   ASSESSMENT:  CLINICAL IMPRESSION: Patient is a 46 y.o. male who was seen today for assessment of swallowing as they undergo  radiation/chemoradiation therapy. Today pt drank thin liquids using strong swallow and smaller sips - said this felt more normal and didn't have the same feeling in posterior oral cavity/nasopharynx that he did when he experienced nasal regurgitation. Pt politely refused applesauce due to dysgeusia. At this time pt swallowing is deemed [employer] with thin liquids using strategies above. No oral or overt s/sx pharyngeal deficits, including aspiration were observed. There are no overt s/s aspiration PNA observed by SLP nor any reported by pt at this time. Data indicate that pt's swallow ability will likely decrease over the course of radiation/chemoradiation therapy and could very well decline over time following the conclusion of that therapy due to muscle disuse atrophy and/or muscle fibrosis. Completion of HEP was ultimately done with modified independence. Pt will cont to need to be seen by SLP in order to assess safety of PO intake, assess the need for recommending any objective swallow assessment, and ensuring pt is correctly completing the individualized HEP.  OBJECTIVE IMPAIRMENTS include dysphagia. These impairments are limiting patient from safety when swallowing. Factors affecting potential to achieve goals and functional outcome are (possibly) cooperation/participation level, and pain level. Patient will benefit from skilled SLP services to address above impairments and improve overall function.  REHAB POTENTIAL: Good   GOALS: SHORT TERM GOALS: Target date: 10-24-21  pt will complete HEP with rare min A Baseline: Goal status: Not met  2.  pt will tell SLP why pt is completing HEP with modified independence  Baseline:  Goal status: Met  3.  pt will describe 3 overt s/s aspiration PNA with modified independence  Baseline:  Goal status: Met   LONG TERM GOALS: Target date: 12-17-21  pt will tell SLP how a food journal could hasten return to a more normalized diet Baseline:  Goal status:  Ongoing  2.  pt will complete HEP with modified independence over 2 visits Baseline:  Goal status: Ongoing  3.  pt will describe how to modify HEP over time, and the timeline associated with reduction in HEP frequency with modified independence over 2 sessions Baseline:  Goal status: Ongoing    PLAN: SLP FREQUENCY: once approx every 4 weeks  SLP DURATION: 90 days  PLANNED INTERVENTIONS: Aspiration precaution training, Pharyngeal strengthening exercises, Diet toleration management , Trials of upgraded texture/liquids, Internal/external aids, SLP instruction and feedback, Compensatory strategies, and Patient/family education     [facility], [place] 10/16/2021, 2:03 PM

## 2021-10-17 ENCOUNTER — Ambulatory Visit
Admission: RE | Admit: 2021-10-17 | Discharge: 2021-10-17 | Disposition: A | Discharge status: Home / Self Care | Payer: Commercial Managed Care - PPO | Source: Ambulatory Visit | Attending: Radiation Oncology | Admitting: Radiation Oncology

## 2021-10-17 ENCOUNTER — Other Ambulatory Visit: Payer: Self-pay

## 2021-10-17 DIAGNOSIS — Z5111 Encounter for antineoplastic chemotherapy: Secondary | ICD-10-CM | POA: Diagnosis not present

## 2021-10-17 LAB — RAD ONC ARIA SESSION SUMMARY
Course Elapsed Days: 30
Plan Fractions Treated to Date: 22
Plan Prescribed Dose Per Fraction: 2 Gy
Plan Total Fractions Prescribed: 35
Plan Total Prescribed Dose: 70 Gy
Reference Point Dosage Given to Date: 44 Gy
Reference Point Session Dosage Given: 2 Gy
Session Number: 22

## 2021-10-20 ENCOUNTER — Ambulatory Visit: Payer: Commercial Managed Care - PPO

## 2021-10-20 ENCOUNTER — Ambulatory Visit
Admission: RE | Admit: 2021-10-20 | Discharge: 2021-10-20 | Disposition: A | Discharge status: Home / Self Care | Payer: Commercial Managed Care - PPO | Source: Ambulatory Visit | Attending: Radiation Oncology | Admitting: Radiation Oncology

## 2021-10-20 ENCOUNTER — Other Ambulatory Visit: Payer: Self-pay

## 2021-10-20 ENCOUNTER — Other Ambulatory Visit: Payer: Self-pay | Admitting: Radiation Oncology

## 2021-10-20 DIAGNOSIS — Z5111 Encounter for antineoplastic chemotherapy: Secondary | ICD-10-CM | POA: Diagnosis not present

## 2021-10-20 DIAGNOSIS — C113 Malignant neoplasm of anterior wall of nasopharynx: Secondary | ICD-10-CM

## 2021-10-20 LAB — RAD ONC ARIA SESSION SUMMARY
Course Elapsed Days: 33
Plan Fractions Treated to Date: 23
Plan Prescribed Dose Per Fraction: 2 Gy
Plan Total Fractions Prescribed: 35
Plan Total Prescribed Dose: 70 Gy
Reference Point Dosage Given to Date: 46 Gy
Reference Point Session Dosage Given: 2 Gy
Session Number: 23

## 2021-10-20 MED ORDER — OMEPRAZOLE 2 MG/ML ORAL SUSPENSION: 20.0000 mg | Freq: Every day | ORAL | 2 refills | Status: DC

## 2021-10-20 MED ORDER — SCOPOLAMINE 1 MG/3DAYS TD PT72: 1.0000 | MEDICATED_PATCH | TRANSDERMAL | 2 refills | Status: DC

## 2021-10-20 MED ORDER — ONDANSETRON 8 MG PO TBDP: 8.0000 mg | ORAL_TABLET | Freq: Three times a day (TID) | ORAL | 3 refills | Status: DC | PRN

## 2021-10-21 ENCOUNTER — Encounter: Payer: Self-pay | Admitting: Hematology and Oncology

## 2021-10-21 ENCOUNTER — Inpatient Hospital Stay (HOSPITAL_BASED_OUTPATIENT_CLINIC_OR_DEPARTMENT_OTHER): Payer: Commercial Managed Care - PPO | Admitting: Hematology and Oncology

## 2021-10-21 ENCOUNTER — Ambulatory Visit
Admission: RE | Admit: 2021-10-21 | Discharge: 2021-10-21 | Disposition: A | Discharge status: Home / Self Care | Payer: Commercial Managed Care - PPO | Source: Ambulatory Visit | Attending: Radiation Oncology | Admitting: Radiation Oncology

## 2021-10-21 ENCOUNTER — Other Ambulatory Visit: Payer: Self-pay

## 2021-10-21 ENCOUNTER — Inpatient Hospital Stay: Payer: Commercial Managed Care - PPO | Attending: Hematology and Oncology

## 2021-10-21 DIAGNOSIS — Z931 Gastrostomy status: Secondary | ICD-10-CM | POA: Insufficient documentation

## 2021-10-21 DIAGNOSIS — C090 Malignant neoplasm of tonsillar fossa: Secondary | ICD-10-CM | POA: Insufficient documentation

## 2021-10-21 DIAGNOSIS — Z800 Family history of malignant neoplasm of digestive organs: Secondary | ICD-10-CM | POA: Insufficient documentation

## 2021-10-21 DIAGNOSIS — K1231 Oral mucositis (ulcerative) due to antineoplastic therapy: Secondary | ICD-10-CM | POA: Diagnosis not present

## 2021-10-21 DIAGNOSIS — R634 Abnormal weight loss: Secondary | ICD-10-CM

## 2021-10-21 DIAGNOSIS — Z87891 Personal history of nicotine dependence: Secondary | ICD-10-CM | POA: Insufficient documentation

## 2021-10-21 DIAGNOSIS — G893 Neoplasm related pain (acute) (chronic): Secondary | ICD-10-CM

## 2021-10-21 DIAGNOSIS — Z5111 Encounter for antineoplastic chemotherapy: Secondary | ICD-10-CM | POA: Insufficient documentation

## 2021-10-21 DIAGNOSIS — C113 Malignant neoplasm of anterior wall of nasopharynx: Secondary | ICD-10-CM | POA: Insufficient documentation

## 2021-10-21 DIAGNOSIS — K5900 Constipation, unspecified: Secondary | ICD-10-CM | POA: Insufficient documentation

## 2021-10-21 DIAGNOSIS — Z95828 Presence of other vascular implants and grafts: Secondary | ICD-10-CM

## 2021-10-21 DIAGNOSIS — D72819 Decreased white blood cell count, unspecified: Secondary | ICD-10-CM | POA: Insufficient documentation

## 2021-10-21 LAB — RAD ONC ARIA SESSION SUMMARY
Course Elapsed Days: 34
Plan Fractions Treated to Date: 24
Plan Prescribed Dose Per Fraction: 2 Gy
Plan Total Fractions Prescribed: 35
Plan Total Prescribed Dose: 70 Gy
Reference Point Dosage Given to Date: 48 Gy
Reference Point Session Dosage Given: 2 Gy
Session Number: 24

## 2021-10-21 LAB — CBC WITH DIFFERENTIAL (CANCER CENTER ONLY)
Abs Immature Granulocytes: 0.01 10*3/uL (ref 0.00–0.07)
Basophils Absolute: 0 10*3/uL (ref 0.0–0.1)
Basophils Relative: 0 %
Eosinophils Absolute: 0 10*3/uL (ref 0.0–0.5)
Eosinophils Relative: 0 %
HCT: 28.8 % — ABNORMAL LOW (ref 39.0–52.0)
Hemoglobin: 10.1 g/dL — ABNORMAL LOW (ref 13.0–17.0)
Immature Granulocytes: 0 %
Lymphocytes Relative: 9 %
Lymphs Abs: 0.2 10*3/uL — ABNORMAL LOW (ref 0.7–4.0)
MCH: 31.6 pg (ref 26.0–34.0)
MCHC: 35.1 g/dL (ref 30.0–36.0)
MCV: 90 fL (ref 80.0–100.0)
Monocytes Absolute: 0.4 10*3/uL (ref 0.1–1.0)
Monocytes Relative: 18 %
Neutro Abs: 1.7 10*3/uL (ref 1.7–7.7)
Neutrophils Relative %: 73 %
Platelet Count: 139 10*3/uL — ABNORMAL LOW (ref 150–400)
RBC: 3.2 MIL/uL — ABNORMAL LOW (ref 4.22–5.81)
RDW: 13.6 % (ref 11.5–15.5)
WBC Count: 2.4 10*3/uL — ABNORMAL LOW (ref 4.0–10.5)
nRBC: 0 % (ref 0.0–0.2)

## 2021-10-21 LAB — BASIC METABOLIC PANEL - CANCER CENTER ONLY
Anion gap: 6 (ref 5–15)
BUN: 18 mg/dL (ref 6–20)
CO2: 32 mmol/L (ref 22–32)
Calcium: 9.4 mg/dL (ref 8.9–10.3)
Chloride: 100 mmol/L (ref 98–111)
Creatinine: 0.72 mg/dL (ref 0.61–1.24)
GFR, Estimated: >60 mL/min (ref >60)
Glucose, Bld: 101 mg/dL — ABNORMAL HIGH (ref 70–99)
Potassium: 4.2 mmol/L (ref 3.5–5.1)
Sodium: 138 mmol/L (ref 135–145)

## 2021-10-21 LAB — MAGNESIUM: Magnesium: 2.2 mg/dL (ref 1.7–2.4)

## 2021-10-21 MED ORDER — SODIUM CHLORIDE 0.9% FLUSH
10.0000 mL | Freq: Once | INTRAVENOUS | Status: AC
  Administered 2021-10-21: 10 mL

## 2021-10-21 MED ORDER — HEPARIN SOD (PORK) LOCK FLUSH 100 UNIT/ML IV SOLN
500.0000 [IU] | Freq: Once | INTRAVENOUS | Status: AC
  Administered 2021-10-21: 500 [IU]

## 2021-10-21 MED FILL — Fosaprepitant Dimeglumine For IV Infusion 150 MG (Base Eq): INTRAVENOUS | Qty: 5 | Status: AC

## 2021-10-21 MED FILL — Dexamethasone Sodium Phosphate Inj 100 MG/10ML: INTRAMUSCULAR | Qty: 1 | Status: AC

## 2021-10-21 NOTE — Assessment & Plan Note (Signed)
Severe mucositis from ongoing chemoradiation

## 2021-10-21 NOTE — Progress Notes (Signed)
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  He is here before cycle 6 of cisplatin.  He has ongoing sore throat from chemoradiation, has been using oxycodone per G-tube every 6 hours as recommended.  Pain is stable since last visit. Okay to proceed with chemotherapy as planned tomorrow if labs are all within parameters.  CBC reviewed, leukopenia noted absolute neutrophil count is still within normal limits.  Cancer related pain He initially and wanted to switch to liquid hydrocodone but he could not tolerate it, severe gag hence went back to Vicodin tablets which he uses via the G-tube every 6 hours for pain control.  Currently his pain is well managed with this regimen.  Mucositis due to antineoplastic therapy Severe mucositis from ongoing chemoradiation  Weight loss, unintentional He has lost 1 pound since last visit.  He continues on G-tube feedings.  We will continue to monitor his weight  Plan Return to clinic in 1 week before cycle 7 of cisplatin.   No orders of the defined types were placed in this encounter.  Total time spent: 30 minutes  HISTORY OF PRESENTING ILLNESS:  HAN VEJAR 46 y.o. 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  Oncology History  Primary cancer of tonsillar fossa ([place])  08/19/2021 Imaging   Patient  complained of spitting chunks of tissue hence had a CT maxillofacial with contrast as well as with the neck with contrast.  Mucosal hyperenhancement in the right nasopharynx extending to the oropharynx with mostly soft tissue density in the region of right palatine tonsil most suspicious for malignancy.  There is also a 1.8 x 1.5 x 2.4 cm peripherally enhancing hyperintense lesion in the right retropharynx suspicious for necrotic lymph node with additional prominent right level 2 lymph nodes measuring up to 1.3 cm in short axis with suspected extracapsular extension at level to me.   08/27/2021 Initial Diagnosis   Primary cancer of tonsillar fossa ([place])    09/01/2021 PET scan   PET scan from April 17 showed locally advanced right palatine tonsil primary with ipsilateral cervical nodal metastasis no evidence of hypermetabolic extracervical metastasis.  Tiny pulmonary nodules below PET resolution consider chest CT follow-up at 6 months   09/03/2021 Cancer Staging   Staging form: Pharynx - P16 Negative Oropharynx, AJCC 8th Edition - Clinical stage from 09/03/2021: Stage IVA (cT3, cN2b, cM0, p16-) - Signed by Eppie Gibson, MD on 09/03/2021 Stage prefix: Initial diagnosis    09/17/2021 -  Chemotherapy   Patient is on Treatment Plan : HEAD/NECK Cisplatin q7d       He started cycle 1 on 09/17/2021.    Interval History  Mr. Brockman is here for follow up before planned C6 of chemotherapy. Last day of radiation 21st. He has been using the tab in the G tube every 6 hrs. He is wearing scopolamine patch, changes every 3 days, this helps with the gag. No hearing, tingling or numbness. He has baseline tinnitus,  hasnt gotten worse after the first cycle. Rest of the pertinent 10 point ROS reviewed and negative  MEDICAL HISTORY:  Past Medical History:  Diagnosis Date   Anxiety    Never officially diagnosed; does not take medication for anxiety   Asthma    Hasn't had an episode since childhood    Depression     SURGICAL HISTORY: Past Surgical History:  Procedure Laterality Date   ELBOW SURGERY Bilateral    HIP SURGERY Left    IR GASTROSTOMY TUBE MOD SED  09/11/2021   IR IMAGING GUIDED PORT INSERTION  09/11/2021   NECK SURGERY  05/09/2018   Cervical fusion (Dr. Atilano Ina)   PANENDOSCOPY N/A 08/19/2021   Procedure: PANENDOSCOPY WITH BIPOSY--direct laryngoscopy, flexible bronchoscopy, rigid esophagoscopy, biopsy of lesion in throat;  Surgeon: Marcina Millard, MD;  Location: [place];  Service: ENT;  Laterality: N/A;   PLANTAR FASCIA SURGERY     SHOULDER SURGERY Left    TONSILLECTOMY     WISDOM TOOTH EXTRACTION Bilateral 1995    SOCIAL HISTORY: Social History   Socioeconomic History   Marital status: Single    Spouse name: Not on file   Number of children: Not on file   Years of education: Not on file   Highest education level: Not on file  Occupational History   Not on file  Tobacco Use   Smoking status: Former    Packs/day: 1.50    Years: 20.00    Pack years: 30.00    Types: Cigarettes    Quit date: 10/02/2019    Years since quitting: 2.0   Smokeless tobacco: Never  Vaping Use   Vaping Use: Never used  Substance and Sexual Activity   Alcohol use: Not Currently    Alcohol/week: 7.0 standard drinks    Types: 7 Shots of liquor per week   Drug use: Yes    Types: Marijuana   Sexual activity: Not Currently  Other Topics Concern   Not on file  Social History Narrative   Not on file   Social Determinants of Health   Financial Resource Strain: Medium Risk   Difficulty of Paying Living Expenses: Somewhat hard  Food Insecurity: Not on file  Transportation Needs: No Transportation Needs   Lack of Transportation (Medical): No   Lack of Transportation (Non-Medical): No  Physical Activity: Not on file  Stress: Not on file  Social Connections: Not on file  Intimate Partner Violence: Not on file    FAMILY HISTORY: Family History  Problem Relation Age of  Onset   Colon cancer Mother     ALLERGIES:  is allergic to dust mite extract, other, and pollen extract-tree extract [pollen extract].  MEDICATIONS:  Current Outpatient Medications  Medication Sig Dispense Refill   dexamethasone (DECADRON) 4 MG tablet Take 2 tablets (8 mg total) by mouth daily. Take daily x 3 days starting the day after cisplatin chemotherapy. Take with food. 30 tablet 1   diphenhydrAMINE (BENADRYL) 25 mg capsule Take 1 capsule (25 mg total) by mouth daily as needed for sleep. 30 capsule 0   HYDROcodone-acetaminophen (NORCO/VICODIN) 5-325 MG tablet Take 1-2 tablets by mouth every 6 (six) hours as needed for moderate pain. 60 tablet 0   ibuprofen (ADVIL) 200 MG tablet Take 400 mg by mouth See admin instructions. Pt states they are taking 400 mg every 2-3 hours as needed.     Ibuprofen-diphenhydrAMINE Cit (IBUPROFEN PM PO) Take 2 tablets by mouth at bedtime.     lidocaine (XYLOCAINE)  2 % solution Patient: Mix 1part 2% viscous lidocaine, 1part H20. Swish & swallow 77m of diluted mixture, 352m before meals and at bedtime, up to QID 200 mL 3   lidocaine-prilocaine (EMLA) cream Apply to affected area once 30 g 3   Nutritional Supplements (K[employer]AR[employer] STANDARD 1.4) LIQD 6 cartons via tube split over four feedings. Flush tube with 90 ml water before and after feedings. Drink by mouth or give via tube additional 2 cups water. This provides 2730 kcal, 120 grams protein, 1404 ml free water (2598 ml total water with flushes). Meets 100% needs      omeprazole (FIRST-OMEPRAZOLE) 2 mg/mL SUSP oral suspension Place 10 mLs (20 mg total) into feeding tube daily. To prevent heartburn. 300 mL 2   ondansetron (ZOFRAN-ODT) 8 MG disintegrating tablet Take 1 tablet (8 mg total) by mouth every 8 (eight) hours as needed for nausea or vomiting (Take around the clock prophylactically if nausea is severe.). 60 tablet 3   prochlorperazine (COMPAZINE) 10 MG tablet Take 1 tablet (10 mg total) by mouth every 6  (six) hours as needed (Nausea or vomiting). 30 tablet 1   scopolamine (TRANSDERM-SCOP) 1 MG/3DAYS Place 1 patch (1.5 mg total) onto the skin every 3 (three) days. To help with thick secretions. 10 patch 2   sertraline (ZOLOFT) 100 MG tablet Take 100 mg by mouth daily.     sucralfate (CARAFATE) 1 g tablet Dissolve 1 tablet in 10 mL H20 and swallow 10-20 min prior to meals and bedtime up to QID PRN soreness. 40 tablet 3   traZODone (DESYREL) 50 MG tablet Take 50 mg by mouth at bedtime. Able to take 50-'200mg'$  as needed for sleep     No current facility-administered medications for this visit.     PHYSICAL EXAMINATION: ECOG PERFORMANCE STATUS: 0 - Asymptomatic  Vitals:   10/21/21 1500  BP: (!) 127/96  Pulse: (!) 107  Resp: 18  Temp: 98.1 F (36.7 C)  SpO2: 98%      Filed Weights   10/21/21 1500  Weight: 158 lb 12.8 oz (72 kg)   GENERAL:alert, no distress and comfortable Neck: Right cervical lymphadenopathy significantly improved, almost nonpalpable Mouth: Severe mucositis posterior oropharynx Skin: Radiation changes noted. Chest: Clear to auscultation bilaterally, rate rhythm regular Lower extremities: No lower extremity edema   LABORATORY DATA:  I have reviewed the data as listed Lab Results  Component Value Date   WBC 2.4 (L) 10/21/2021   HGB 10.1 (L) 10/21/2021   HCT 28.8 (L) 10/21/2021   MCV 90.0 10/21/2021   PLT 139 (L) 10/21/2021     Chemistry      Component Value Date/Time   NA 138 10/21/2021 1436   K 4.2 10/21/2021 1436   CL 100 10/21/2021 1436   CO2 32 10/21/2021 1436   BUN 18 10/21/2021 1436   CREATININE 0.72 10/21/2021 1436      Component Value Date/Time   CALCIUM 9.4 10/21/2021 1436   ALKPHOS 57 08/19/2021 1012   AST 16 08/19/2021 1012   ALT 12 08/19/2021 1012   BILITOT 0.5 08/19/2021 1012       RADIOGRAPHIC STUDIES: I have personally reviewed the radiological images as listed and agreed with the findings in the report. No results  found.  All questions were answered. The patient knows to call the clinic with any problems, questions or concerns    PrBenay PikeMD 10/21/2021 3:25 PM

## 2021-10-21 NOTE — Assessment & Plan Note (Signed)
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  He is here before cycle 6 of cisplatin.  He has ongoing sore throat from chemoradiation, has been using oxycodone per G-tube every 6 hours as recommended.  Pain is stable since last visit. Okay to proceed with chemotherapy as planned tomorrow if labs are all within parameters.  CBC reviewed, leukopenia noted absolute neutrophil count is still within normal limits.

## 2021-10-21 NOTE — Assessment & Plan Note (Signed)
He has lost 1 pound since last visit.  He continues on G-tube feedings.  We will continue to monitor his weight

## 2021-10-21 NOTE — Assessment & Plan Note (Signed)
He initially and wanted to switch to liquid hydrocodone but he could not tolerate it, severe gag hence went back to Vicodin tablets which he uses via the G-tube every 6 hours for pain control.  Currently his pain is well managed with this regimen.

## 2021-10-22 ENCOUNTER — Other Ambulatory Visit: Payer: Self-pay

## 2021-10-22 ENCOUNTER — Ambulatory Visit
Admission: RE | Admit: 2021-10-22 | Discharge: 2021-10-22 | Disposition: A | Discharge status: Home / Self Care | Payer: Commercial Managed Care - PPO | Source: Ambulatory Visit | Attending: Radiation Oncology | Admitting: Radiation Oncology

## 2021-10-22 ENCOUNTER — Inpatient Hospital Stay: Payer: Commercial Managed Care - PPO | Admitting: Dietician

## 2021-10-22 ENCOUNTER — Inpatient Hospital Stay: Payer: Commercial Managed Care - PPO

## 2021-10-22 VITALS — BP 135/86 | HR 97 | Temp 99.3°F | Resp 17

## 2021-10-22 DIAGNOSIS — C090 Malignant neoplasm of tonsillar fossa: Secondary | ICD-10-CM

## 2021-10-22 DIAGNOSIS — Z5111 Encounter for antineoplastic chemotherapy: Secondary | ICD-10-CM | POA: Diagnosis not present

## 2021-10-22 LAB — RAD ONC ARIA SESSION SUMMARY
Course Elapsed Days: 35
Plan Fractions Treated to Date: 25
Plan Prescribed Dose Per Fraction: 2 Gy
Plan Total Fractions Prescribed: 35
Plan Total Prescribed Dose: 70 Gy
Reference Point Dosage Given to Date: 50 Gy
Reference Point Session Dosage Given: 2 Gy
Session Number: 25

## 2021-10-22 MED ORDER — SODIUM CHLORIDE 0.9 % IV SOLN: Freq: Once | INTRAVENOUS | Status: AC

## 2021-10-22 MED ORDER — SODIUM CHLORIDE 0.9% FLUSH
10.0000 mL | INTRAVENOUS | Status: DC | PRN
  Administered 2021-10-22: 10 mL

## 2021-10-22 MED ORDER — SODIUM CHLORIDE 0.9 % IV SOLN
10.0000 mg | Freq: Once | INTRAVENOUS | Status: AC
  Administered 2021-10-22: 10 mg via INTRAVENOUS
  Filled 2021-10-22: qty 10

## 2021-10-22 MED ORDER — POTASSIUM CHLORIDE IN NACL 20-0.9 MEQ/L-% IV SOLN
Freq: Once | INTRAVENOUS | Status: AC
  Filled 2021-10-22: qty 1000

## 2021-10-22 MED ORDER — SODIUM CHLORIDE 0.9 % IV SOLN
150.0000 mg | Freq: Once | INTRAVENOUS | Status: AC
  Administered 2021-10-22: 150 mg via INTRAVENOUS
  Filled 2021-10-22: qty 150

## 2021-10-22 MED ORDER — SODIUM CHLORIDE 0.9 % IV SOLN
30.0000 mg/m2 | Freq: Once | INTRAVENOUS | Status: AC
  Administered 2021-10-22: 59 mg via INTRAVENOUS
  Filled 2021-10-22: qty 59

## 2021-10-22 MED ORDER — HEPARIN SOD (PORK) LOCK FLUSH 100 UNIT/ML IV SOLN
500.0000 [IU] | Freq: Once | INTRAVENOUS | Status: AC | PRN
  Administered 2021-10-22: 500 [IU]

## 2021-10-22 MED ORDER — MAGNESIUM SULFATE 2 GM/50ML IV SOLN
2.0000 g | Freq: Once | INTRAVENOUS | Status: AC
  Administered 2021-10-22: 2 g via INTRAVENOUS
  Filled 2021-10-22: qty 50

## 2021-10-22 MED ORDER — PALONOSETRON HCL INJECTION 0.25 MG/5ML
0.2500 mg | Freq: Once | INTRAVENOUS | Status: AC
  Administered 2021-10-22: 0.25 mg via INTRAVENOUS
  Filled 2021-10-22: qty 5

## 2021-10-22 NOTE — Progress Notes (Signed)
Nutrition Follow-up:  Patient with tonsil cancer. He is receiving concurrent chemoradiation with weekly cisplatin. Dose reduced starting C2 due to tinnitus.   Brief nutrition follow-up completed with patient during infusion as he is attempting to rest at time of visit. Patient has not been sleeping well over the past few weeks, reports this has gotten worse. Patient continues to report significant mucositis. He is not eating or drinking by mouth. Patient is crushing pain medication and administering via feeding tube. He reports receiving shipment of formula and supplies. Patient recalls giving 6-8 cartons [employer] 1.4 via tube. He is tolerating well.    Medications: omeprazole, scopolamine, zofran-odt  Labs: glucose 101  Anthropometrics: Weight 158 lb 12.8 oz on 6/6 stable.   5/31 - 159 lb 11 oz  5/23 - 157 lb 6.4 oz 5/16 - 151 lb 8 oz 5/09 - 156 lb 9.6 oz 5/02 - 159 lb 1.6 oz   Estimated Energy Needs   Kcals: 2358-2625 Protein: 120-134 Fluid: 2.6 L   NUTRITION DIAGNOSIS: Inadequate oral intake ongoing, pt meeting nutrition/hydration needs with tube feedings    INTERVENTION:  Continue [employer] 1.4 - 6 cartons daily    MONITORING, EVALUATION, GOAL: weight trends, tube feeding   NEXT VISIT: Wednesday June 14 during infusion

## 2021-10-23 ENCOUNTER — Other Ambulatory Visit: Payer: Self-pay

## 2021-10-23 ENCOUNTER — Ambulatory Visit
Admission: RE | Admit: 2021-10-23 | Discharge: 2021-10-23 | Disposition: A | Discharge status: Home / Self Care | Payer: Commercial Managed Care - PPO | Source: Ambulatory Visit | Attending: Radiation Oncology | Admitting: Radiation Oncology

## 2021-10-23 DIAGNOSIS — Z5111 Encounter for antineoplastic chemotherapy: Secondary | ICD-10-CM | POA: Diagnosis not present

## 2021-10-23 LAB — RAD ONC ARIA SESSION SUMMARY
Course Elapsed Days: 36
Plan Fractions Treated to Date: 26
Plan Prescribed Dose Per Fraction: 2 Gy
Plan Total Fractions Prescribed: 35
Plan Total Prescribed Dose: 70 Gy
Reference Point Dosage Given to Date: 52 Gy
Reference Point Session Dosage Given: 2 Gy
Session Number: 26

## 2021-10-24 ENCOUNTER — Other Ambulatory Visit: Payer: Self-pay | Admitting: Hematology and Oncology

## 2021-10-24 ENCOUNTER — Other Ambulatory Visit (HOSPITAL_COMMUNITY): Payer: Self-pay

## 2021-10-24 ENCOUNTER — Other Ambulatory Visit: Payer: Self-pay

## 2021-10-24 ENCOUNTER — Encounter: Payer: Self-pay | Admitting: Hematology and Oncology

## 2021-10-24 ENCOUNTER — Ambulatory Visit
Admission: RE | Admit: 2021-10-24 | Discharge: 2021-10-24 | Disposition: A | Discharge status: Home / Self Care | Payer: Commercial Managed Care - PPO | Source: Ambulatory Visit | Attending: Radiation Oncology | Admitting: Radiation Oncology

## 2021-10-24 DIAGNOSIS — Z5111 Encounter for antineoplastic chemotherapy: Secondary | ICD-10-CM | POA: Diagnosis not present

## 2021-10-24 LAB — RAD ONC ARIA SESSION SUMMARY
Course Elapsed Days: 37
Plan Fractions Treated to Date: 27
Plan Prescribed Dose Per Fraction: 2 Gy
Plan Total Fractions Prescribed: 35
Plan Total Prescribed Dose: 70 Gy
Reference Point Dosage Given to Date: 54 Gy
Reference Point Session Dosage Given: 2 Gy
Session Number: 27

## 2021-10-24 MED ORDER — KONVOMEP 2-84 MG/ML PO SUSR
ORAL | 2 refills | Status: DC
  Filled 2021-10-24: qty 300, 30d supply, fill #0

## 2021-10-24 MED ORDER — HYDROCODONE-ACETAMINOPHEN 5-325 MG PO TABS
1.0000 | ORAL_TABLET | Freq: Four times a day (QID) | ORAL | 0 refills | Status: DC | PRN
  Filled 2021-10-24: qty 60, 8d supply, fill #0

## 2021-10-25 ENCOUNTER — Other Ambulatory Visit (HOSPITAL_COMMUNITY): Payer: Self-pay

## 2021-10-27 ENCOUNTER — Ambulatory Visit
Admission: RE | Admit: 2021-10-27 | Discharge: 2021-10-27 | Disposition: A | Discharge status: Home / Self Care | Payer: Commercial Managed Care - PPO | Source: Ambulatory Visit | Attending: Radiation Oncology | Admitting: Radiation Oncology

## 2021-10-27 ENCOUNTER — Other Ambulatory Visit: Payer: Self-pay

## 2021-10-27 DIAGNOSIS — Z5111 Encounter for antineoplastic chemotherapy: Secondary | ICD-10-CM | POA: Diagnosis not present

## 2021-10-27 LAB — RAD ONC ARIA SESSION SUMMARY
Course Elapsed Days: 40
Plan Fractions Treated to Date: 28
Plan Prescribed Dose Per Fraction: 2 Gy
Plan Total Fractions Prescribed: 35
Plan Total Prescribed Dose: 70 Gy
Reference Point Dosage Given to Date: 56 Gy
Reference Point Session Dosage Given: 2 Gy
Session Number: 28

## 2021-10-28 ENCOUNTER — Other Ambulatory Visit (HOSPITAL_COMMUNITY): Payer: Self-pay

## 2021-10-28 ENCOUNTER — Ambulatory Visit: Payer: Commercial Managed Care - PPO

## 2021-10-28 ENCOUNTER — Inpatient Hospital Stay (HOSPITAL_BASED_OUTPATIENT_CLINIC_OR_DEPARTMENT_OTHER): Payer: Commercial Managed Care - PPO | Admitting: Physician Assistant

## 2021-10-28 ENCOUNTER — Inpatient Hospital Stay: Payer: Commercial Managed Care - PPO

## 2021-10-28 ENCOUNTER — Ambulatory Visit: Admission: RE | Admit: 2021-10-28 | Payer: Commercial Managed Care - PPO | Source: Ambulatory Visit

## 2021-10-28 VITALS — BP 137/89 | HR 97 | Temp 99.3°F | Resp 18 | Ht 72.0 in | Wt 161.1 lb

## 2021-10-28 DIAGNOSIS — K1231 Oral mucositis (ulcerative) due to antineoplastic therapy: Secondary | ICD-10-CM | POA: Diagnosis not present

## 2021-10-28 DIAGNOSIS — Z5111 Encounter for antineoplastic chemotherapy: Secondary | ICD-10-CM | POA: Diagnosis not present

## 2021-10-28 DIAGNOSIS — C090 Malignant neoplasm of tonsillar fossa: Secondary | ICD-10-CM | POA: Diagnosis not present

## 2021-10-28 DIAGNOSIS — C113 Malignant neoplasm of anterior wall of nasopharynx: Secondary | ICD-10-CM | POA: Diagnosis not present

## 2021-10-28 DIAGNOSIS — R6889 Other general symptoms and signs: Secondary | ICD-10-CM

## 2021-10-28 DIAGNOSIS — G893 Neoplasm related pain (acute) (chronic): Secondary | ICD-10-CM

## 2021-10-28 DIAGNOSIS — Z95828 Presence of other vascular implants and grafts: Secondary | ICD-10-CM

## 2021-10-28 LAB — CBC WITH DIFFERENTIAL (CANCER CENTER ONLY)
Abs Immature Granulocytes: 0.02 10*3/uL (ref 0.00–0.07)
Basophils Absolute: 0 10*3/uL (ref 0.0–0.1)
Basophils Relative: 1 %
Eosinophils Absolute: 0 10*3/uL (ref 0.0–0.5)
Eosinophils Relative: 1 %
HCT: 24.8 % — ABNORMAL LOW (ref 39.0–52.0)
Hemoglobin: 8.9 g/dL — ABNORMAL LOW (ref 13.0–17.0)
Immature Granulocytes: 1 %
Lymphocytes Relative: 6 %
Lymphs Abs: 0.1 10*3/uL — ABNORMAL LOW (ref 0.7–4.0)
MCH: 32 pg (ref 26.0–34.0)
MCHC: 35.9 g/dL (ref 30.0–36.0)
MCV: 89.2 fL (ref 80.0–100.0)
Monocytes Absolute: 0.4 10*3/uL (ref 0.1–1.0)
Monocytes Relative: 19 %
Neutro Abs: 1.3 10*3/uL — ABNORMAL LOW (ref 1.7–7.7)
Neutrophils Relative %: 72 %
Platelet Count: 123 10*3/uL — ABNORMAL LOW (ref 150–400)
RBC: 2.78 MIL/uL — ABNORMAL LOW (ref 4.22–5.81)
RDW: 14.3 % (ref 11.5–15.5)
Smear Review: NORMAL
WBC Count: 1.8 10*3/uL — ABNORMAL LOW (ref 4.0–10.5)
nRBC: 0 % (ref 0.0–0.2)

## 2021-10-28 LAB — BASIC METABOLIC PANEL - CANCER CENTER ONLY
Anion gap: 7 (ref 5–15)
BUN: 13 mg/dL (ref 6–20)
CO2: 31 mmol/L (ref 22–32)
Calcium: 9.5 mg/dL (ref 8.9–10.3)
Chloride: 94 mmol/L — ABNORMAL LOW (ref 98–111)
Creatinine: 0.74 mg/dL (ref 0.61–1.24)
GFR, Estimated: >60 mL/min (ref >60)
Glucose, Bld: 139 mg/dL — ABNORMAL HIGH (ref 70–99)
Potassium: 4.1 mmol/L (ref 3.5–5.1)
Sodium: 132 mmol/L — ABNORMAL LOW (ref 135–145)

## 2021-10-28 LAB — MAGNESIUM: Magnesium: 1.9 mg/dL (ref 1.7–2.4)

## 2021-10-28 MED ORDER — LIDOCAINE VISCOUS HCL 2 % MT SOLN
OROMUCOSAL | 3 refills | Status: DC
  Filled 2021-10-28: qty 200, fill #0

## 2021-10-28 MED ORDER — PROCHLORPERAZINE MALEATE 10 MG PO TABS
10.0000 mg | ORAL_TABLET | Freq: Four times a day (QID) | ORAL | 1 refills | Status: AC | PRN
  Filled 2021-10-28: qty 30, 8d supply, fill #0

## 2021-10-28 MED ORDER — FENTANYL 12 MCG/HR TD PT72
1.0000 | MEDICATED_PATCH | TRANSDERMAL | 0 refills | Status: DC
  Filled 2021-10-28: qty 5, 15d supply, fill #0

## 2021-10-28 MED ORDER — ATROPINE ORAL SOLUTION 0.08 MG/ML
0.0800 mg | Freq: Three times a day (TID) | ORAL | 0 refills | Status: DC
  Filled 2021-10-28: qty 120, 40d supply, fill #0

## 2021-10-28 MED ORDER — SODIUM CHLORIDE 0.9% FLUSH
10.0000 mL | Freq: Once | INTRAVENOUS | Status: AC
  Administered 2021-10-28: 10 mL

## 2021-10-28 MED ORDER — HEPARIN SOD (PORK) LOCK FLUSH 100 UNIT/ML IV SOLN
500.0000 [IU] | Freq: Once | INTRAVENOUS | Status: AC
  Administered 2021-10-28: 500 [IU]

## 2021-10-28 MED FILL — Dexamethasone Sodium Phosphate Inj 100 MG/10ML: INTRAMUSCULAR | Qty: 1 | Status: AC

## 2021-10-28 MED FILL — Fosaprepitant Dimeglumine For IV Infusion 150 MG (Base Eq): INTRAVENOUS | Qty: 5 | Status: AC

## 2021-10-29 ENCOUNTER — Inpatient Hospital Stay (HOSPITAL_BASED_OUTPATIENT_CLINIC_OR_DEPARTMENT_OTHER): Payer: Commercial Managed Care - PPO | Admitting: Physician Assistant

## 2021-10-29 ENCOUNTER — Other Ambulatory Visit: Payer: Self-pay

## 2021-10-29 ENCOUNTER — Ambulatory Visit
Admission: RE | Admit: 2021-10-29 | Discharge: 2021-10-29 | Disposition: A | Discharge status: Home / Self Care | Payer: Commercial Managed Care - PPO | Source: Ambulatory Visit | Attending: Radiation Oncology | Admitting: Radiation Oncology

## 2021-10-29 ENCOUNTER — Inpatient Hospital Stay: Payer: Commercial Managed Care - PPO | Admitting: Dietician

## 2021-10-29 ENCOUNTER — Inpatient Hospital Stay: Payer: Commercial Managed Care - PPO

## 2021-10-29 VITALS — BP 144/87 | HR 100 | Resp 18

## 2021-10-29 DIAGNOSIS — G893 Neoplasm related pain (acute) (chronic): Secondary | ICD-10-CM

## 2021-10-29 DIAGNOSIS — C090 Malignant neoplasm of tonsillar fossa: Secondary | ICD-10-CM | POA: Diagnosis not present

## 2021-10-29 DIAGNOSIS — Z5111 Encounter for antineoplastic chemotherapy: Secondary | ICD-10-CM | POA: Diagnosis not present

## 2021-10-29 LAB — RAD ONC ARIA SESSION SUMMARY
Course Elapsed Days: 42
Plan Fractions Treated to Date: 29
Plan Prescribed Dose Per Fraction: 2 Gy
Plan Total Fractions Prescribed: 35
Plan Total Prescribed Dose: 70 Gy
Reference Point Dosage Given to Date: 58 Gy
Reference Point Session Dosage Given: 2 Gy
Session Number: 29

## 2021-10-29 MED ORDER — MORPHINE SULFATE (PF) 2 MG/ML IV SOLN
2.0000 mg | Freq: Once | INTRAVENOUS | Status: AC
  Administered 2021-10-29: 2 mg via INTRAVENOUS
  Filled 2021-10-29: qty 1

## 2021-10-29 MED ORDER — HEPARIN SOD (PORK) LOCK FLUSH 100 UNIT/ML IV SOLN
500.0000 [IU] | Freq: Once | INTRAVENOUS | Status: AC | PRN
  Administered 2021-10-29: 500 [IU]

## 2021-10-29 MED ORDER — SODIUM CHLORIDE 0.9 % IV SOLN
150.0000 mg | Freq: Once | INTRAVENOUS | Status: AC
  Administered 2021-10-29: 150 mg via INTRAVENOUS
  Filled 2021-10-29: qty 150

## 2021-10-29 MED ORDER — SODIUM CHLORIDE 0.9 % IV SOLN: Freq: Once | INTRAVENOUS | Status: AC

## 2021-10-29 MED ORDER — MAGNESIUM SULFATE 2 GM/50ML IV SOLN
2.0000 g | Freq: Once | INTRAVENOUS | Status: AC
  Administered 2021-10-29: 2 g via INTRAVENOUS
  Filled 2021-10-29: qty 50

## 2021-10-29 MED ORDER — HYDROMORPHONE HCL 1 MG/ML IJ SOLN
1.0000 mg | Freq: Once | INTRAMUSCULAR | Status: AC
  Administered 2021-10-29: 1 mg via INTRAVENOUS
  Filled 2021-10-29: qty 1

## 2021-10-29 MED ORDER — SODIUM CHLORIDE 0.9 % IV SOLN
10.0000 mg | Freq: Once | INTRAVENOUS | Status: AC
  Administered 2021-10-29: 10 mg via INTRAVENOUS
  Filled 2021-10-29: qty 10

## 2021-10-29 MED ORDER — HYDROMORPHONE HCL 1 MG/ML IJ SOLN
2.0000 mg | Freq: Once | INTRAMUSCULAR | Status: AC
  Administered 2021-10-29: 2 mg via INTRAVENOUS
  Filled 2021-10-29: qty 2

## 2021-10-29 MED ORDER — SODIUM CHLORIDE 0.9 % IV SOLN
30.0000 mg/m2 | Freq: Once | INTRAVENOUS | Status: AC
  Administered 2021-10-29: 59 mg via INTRAVENOUS
  Filled 2021-10-29: qty 59

## 2021-10-29 MED ORDER — PALONOSETRON HCL INJECTION 0.25 MG/5ML
0.2500 mg | Freq: Once | INTRAVENOUS | Status: AC
  Administered 2021-10-29: 0.25 mg via INTRAVENOUS
  Filled 2021-10-29: qty 5

## 2021-10-29 MED ORDER — POTASSIUM CHLORIDE IN NACL 20-0.9 MEQ/L-% IV SOLN
Freq: Once | INTRAVENOUS | Status: AC
  Filled 2021-10-29: qty 1000

## 2021-10-29 MED ORDER — SODIUM CHLORIDE 0.9% FLUSH
10.0000 mL | INTRAVENOUS | Status: DC | PRN
  Administered 2021-10-29: 10 mL

## 2021-10-29 NOTE — Progress Notes (Signed)
Orders placed per Lexine Baton, NP for [facility] infusion on 10/30/21

## 2021-10-29 NOTE — Progress Notes (Signed)
Nutrition Follow-up:  Patient with tonsil cancer. He is receiving concurrent chemoradiation with weekly cisplatin (started 5/3) Patient will complete  C7 today. Last radiation planned for 6/21.  Met with patient in infusion. Patient reports significant mouth pain today, says he wants a new mouth. He continues using baking soda salt water rinses. Patient is out of lidocaine rinse. He has been unable to have this filled, "every pharmacy is out of stock." Patient reports fentanyl patches are working, but not today. He reports taking oxycodone via tube ~40 minutes prior to visit. This has not helped. Patient requesting additional pain medications. RN made aware, reports sending secure chat MD. He is unsure if he can tolerate radiation treatment today. Patient is not eating or drinking orally. He reports giving 6-8 cartons [employer] 1.4 via tube. Patient reports increasing amount of water, but unsure of how much. He will add 1/2 tsp salt to water flush once daily. He denies diarrhea, constipation, nausea. Patient reports intermittent episodes of vomiting "because of all this stuff in my mouth" Scopolamine patches have been helpful for him.   Medications: atropine solution, fentanyl, xylocaine  Labs: 6/13 - Na 132, glucose 139  Anthropometrics: Weight 161 lb 1.6 oz on 6/13 increased   6/6 - 158 lb 12.8 oz  5/31 - 159 lb 11 oz  5/23 - 157 lb 6.4 oz 5/16 - 151 lb 8 oz 5/09 - 156 lb 9.6 oz 5/02 - 159 lb 1.6 oz  Estimated Energy Needs   Kcals: 2358-2625 Protein: 120-134 Fluid: 2.6 L   NUTRITION DIAGNOSIS: Inadequate oral intake ongoing, pt meeting nutrition/hydration needs with tube feedings    INTERVENTION:  Continue [employer] 1.4 - 6 cartons daily Pt with hyponatremia. Instructed to add 1/2 tsp salt to water flush once daily - will continue to monitor labs   Ongoing encouragement and support provided  MONITORING, EVALUATION, GOAL: weight trends, tube feedings, labs   NEXT VISIT:  Thursday June 22 after radiation (pt aware)

## 2021-10-29 NOTE — Progress Notes (Signed)
Per Dede Query, PA okay for patient to proceed with treatment today with ANC 1.3.

## 2021-10-29 NOTE — Progress Notes (Signed)
Symptom Management Consult note [place]    Patient Care Team: Patient, No Pcp Per as PCP - General (General Practice) Powers, Sheppard Coil, MD as Referring Physician (Neurosurgery) Malmfelt, Stephani Police, RN as Oncology Nurse Navigator Eppie Gibson, MD as Consulting Physician (Radiation Oncology) Benay Pike, MD as Consulting Physician (Hematology and Oncology)    Name of the patient: Johnny Mata  [id number]  01-02-1976   Date of visit: 10/29/2021    Chief complaint/ Reason for visit- sore throat  Oncology History  Primary cancer of tonsillar fossa ([facility])  08/19/2021 Imaging   Patient complained of spitting chunks of tissue hence had a CT maxillofacial with contrast as well as with the neck with contrast.  Mucosal hyperenhancement in the right nasopharynx extending to the oropharynx with mostly soft tissue density in the region of right palatine tonsil most suspicious for malignancy.  There is also a 1.8 x 1.5 x 2.4 cm peripherally enhancing hyperintense lesion in the right retropharynx suspicious for necrotic lymph node with additional prominent right level 2 lymph nodes measuring up to 1.3 cm in short axis with suspected extracapsular extension at level to me.   08/27/2021 Initial Diagnosis   Primary cancer of tonsillar fossa ([place])   09/01/2021 PET scan   PET scan from April 17 showed locally advanced right palatine tonsil primary with ipsilateral cervical nodal metastasis no evidence of hypermetabolic extracervical metastasis.  Tiny pulmonary nodules below PET resolution consider chest CT follow-up at 6 months   09/03/2021 Cancer Staging   Staging form: Pharynx - P16 Negative Oropharynx, AJCC 8th Edition - Clinical stage from 09/03/2021: Stage IVA (cT3, cN2b, cM0, p16-) - Signed by Eppie Gibson, MD on 09/03/2021 Stage prefix: Initial diagnosis   09/17/2021 -  Chemotherapy   Patient is on Treatment Plan : HEAD/NECK Cisplatin q7d       Current Therapy: Cisplatin   Day 1 Cycle 7 today  Interval history- Johnny Mata is a 46 y.o. with oncologic history as above seen in the infusion center today with chief complaint of sore throat.  Patient states his sore throat has been ongoing x 2 months.  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  Patient was previously taking Vicodin for pain however did not feel like it was helping much.  He had an appointment yesterday in medical oncology and was prescribed fentanyl patches.  He is wearing 1 currently and denies any improvement in pain.  He tells me he has 10 out of 10 sharp and burning pain on the right side of his throat.  He states his pain is constant and worse than usual.  He did take 2 Vicodin this morning prior to my evaluation with no improvement in pain either.  He has been tolerating foods per his G-tube.  He denies any fever or chills today.  Denies any difficulty breathing, chest pain, nausea, vomiting.    ROS  All other systems are reviewed and are negative for acute change except as noted in the HPI.    Allergies  Allergen Reactions   Dust Mite Extract Shortness Of Breath and Other (See Comments)    Wheezing   Other Shortness Of Breath and Other (See Comments)    Animal Dander. Wheezing Other reaction(s): Wheezing   Pollen Extract-Tree Extract [Pollen Extract] Shortness Of Breath and Cough    Eye redness, wheezing, runny nose  Past Medical History:  Diagnosis Date   Anxiety    Never officially diagnosed; does not take medication for anxiety   Asthma    Hasn't had an episode since childhood   Depression      Past Surgical History:  Procedure Laterality Date   ELBOW SURGERY Bilateral    HIP SURGERY Left    IR GASTROSTOMY TUBE MOD SED  09/11/2021   IR IMAGING GUIDED PORT INSERTION  09/11/2021   NECK SURGERY  05/09/2018   Cervical fusion (Dr. Atilano Ina)    PANENDOSCOPY N/A 08/19/2021   Procedure: PANENDOSCOPY WITH BIPOSY--direct laryngoscopy, flexible bronchoscopy, rigid esophagoscopy, biopsy of lesion in throat;  Surgeon: Marcina Millard, MD;  Location: [facility];  Service: ENT;  Laterality: N/A;   PLANTAR FASCIA SURGERY     SHOULDER SURGERY Left    TONSILLECTOMY     WISDOM TOOTH EXTRACTION Bilateral 1995    Social History   Socioeconomic History   Marital status: Single    Spouse name: Not on file   Number of children: Not on file   Years of education: Not on file   Highest education level: Not on file  Occupational History   Not on file  Tobacco Use   Smoking status: Former    Packs/day: 1.50    Years: 20.00    Total pack years: 30.00    Types: Cigarettes    Quit date: 10/02/2019    Years since quitting: 2.0   Smokeless tobacco: Never  Vaping Use   Vaping Use: Never used  Substance and Sexual Activity   Alcohol use: Not Currently    Alcohol/week: 7.0 standard drinks of alcohol    Types: 7 Shots of liquor per week   Drug use: Yes    Types: Marijuana   Sexual activity: Not Currently  Other Topics Concern   Not on file  Social History Narrative   Not on file   Social Determinants of Health   Financial Resource Strain: Medium Risk (09/12/2021)   Overall Financial Resource Strain (CARDIA)    Difficulty of Paying Living Expenses: Somewhat hard  Food Insecurity: Not on file  Transportation Needs: No Transportation Needs (09/12/2021)   PRAPARE - Transportation    Lack of Transportation (Medical): No    Lack of Transportation (Non-Medical): No  Physical Activity: Not on file  Stress: Not on file  Social Connections: Not on file  Intimate Partner Violence: Not on file    Family History  Problem Relation Age of Onset   Colon cancer Mother      Current Outpatient Medications:    atropine 0.08 mg/mL SOLN, Take 1 mL (0.08 mg total) by mouth 3 (three) times daily. For excessive secretions., Disp: 120 mL, Rfl: 0    dexamethasone (DECADRON) 4 MG tablet, Take 2 tablets (8 mg total) by mouth daily. Take daily x 3 days starting the day after cisplatin chemotherapy. Take with food., Disp: 30 tablet, Rfl: 1   diphenhydrAMINE (BENADRYL) 25 mg capsule, Take 1 capsule (25 mg total) by mouth daily as needed for sleep., Disp: 30 capsule, Rfl: 0   fentaNYL (DURAGESIC) 12 MCG/HR, Place 1 patch onto the skin every 3 (three) days., Disp: 5 patch, Rfl: 0   HYDROcodone-acetaminophen (NORCO/VICODIN) 5-325 MG tablet, Take 1-2 tablets by mouth every 6 (six) hours as needed for moderate pain., Disp: 60 tablet, Rfl: 0   ibuprofen (ADVIL) 200 MG tablet, Take 400 mg by mouth See admin instructions. Pt states they are taking 400 mg every  2-3 hours as needed., Disp: , Rfl:    Ibuprofen-diphenhydrAMINE Cit (IBUPROFEN PM PO), Take 2 tablets by mouth at bedtime., Disp: , Rfl:    lidocaine (XYLOCAINE) 2 % solution, Patient: Mix 1part 2% viscous lidocaine, 1part H20. Swish & swallow 28m of diluted mixture, 384m before meals and at bedtime, up to four times daily, Disp: 200 mL, Rfl: 3   lidocaine-prilocaine (EMLA) cream, Apply to affected area once, Disp: 30 g, Rfl: 3   Nutritional Supplements (KATE FARMS STANDARD 1.4) LIQD, 6 cartons via tube split over four feedings. Flush tube with 90 ml water before and after feedings. Drink by mouth or give via tube additional 2 cups water. This provides 2730 kcal, 120 grams protein, 1404 ml free water (2598 ml total water with flushes). Meets 100% needs , Disp: , Rfl:    omeprazole (FIRST-OMEPRAZOLE) 2 mg/mL SUSP oral suspension, Place 10 mLs (20 mg total) into feeding tube daily. To prevent heartburn., Disp: 300 mL, Rfl: 2   Omeprazole-Sodium Bicarbonate (KONVOMEP) 2-84 MG/ML SUSR, Take 10 mls by feeding tube once daily to prevent heartburn, Disp: 300 mL, Rfl: 2   ondansetron (ZOFRAN-ODT) 8 MG disintegrating tablet, Take 1 tablet (8 mg total) by mouth every 8 (eight) hours as needed for nausea or vomiting  (Take around the clock prophylactically if nausea is severe.)., Disp: 60 tablet, Rfl: 3   prochlorperazine (COMPAZINE) 10 MG tablet, Take 1 tablet (10 mg total) by mouth every 6 (six) hours as needed (Nausea or vomiting)., Disp: 30 tablet, Rfl: 1   scopolamine (TRANSDERM-SCOP) 1 MG/3DAYS, Place 1 patch (1.5 mg total) onto the skin every 3 (three) days. To help with thick secretions., Disp: 10 patch, Rfl: 2   sertraline (ZOLOFT) 100 MG tablet, Take 100 mg by mouth daily., Disp: , Rfl:    sucralfate (CARAFATE) 1 g tablet, Dissolve 1 tablet in 10 mL H20 and swallow 10-20 min prior to meals and bedtime up to QID PRN soreness., Disp: 40 tablet, Rfl: 3   traZODone (DESYREL) 50 MG tablet, Take 50 mg by mouth at bedtime. Able to take 50-'200mg'$  as needed for sleep, Disp: , Rfl:  No current facility-administered medications for this visit.  Facility-Administered Medications Ordered in Other Visits:    sodium chloride flush (NS) 0.9 % injection 10 mL, 10 mL, Intracatheter, PRN, Iruku, Praveena, MD, 10 mL at 10/29/21 1550  PHYSICAL EXAM: ECOG FS:1 - Symptomatic but completely ambulatory   T: 98.2   BP: 144/87   HR: 100   Resp: 18  O2: 99% Physical Exam Vitals and nursing note reviewed.  Constitutional:      Appearance: He is well-developed. He is not ill-appearing or toxic-appearing.  HENT:     Head: Normocephalic and atraumatic.     Nose: Nose normal.     Mouth/Throat:     Comments: Patient handling secretions without difficulty. He is able to speak in short sentences 2/2 pain.  Mild mucositis  Eyes:     General: No scleral icterus.       Right eye: No discharge.        Left eye: No discharge.     Conjunctiva/sclera: Conjunctivae normal.  Neck:     Vascular: No JVD.  Cardiovascular:     Rate and Rhythm: Normal rate and regular rhythm.     Pulses: Normal pulses.     Heart sounds: Normal heart sounds.  Pulmonary:     Effort: Pulmonary effort is normal.     Breath sounds: Normal breath  sounds.  Abdominal:     General: There is no distension.  Musculoskeletal:        General: Normal range of motion.     Cervical back: Normal range of motion.  Skin:    General: Skin is warm and dry.  Neurological:     Mental Status: He is oriented to person, place, and time.     GCS: GCS eye subscore is 4. GCS verbal subscore is 5. GCS motor subscore is 6.     Comments: Fluent speech, no facial droop.  Psychiatric:        Behavior: Behavior normal.        LABORATORY DATA: I have reviewed the data as listed    Latest Ref Rng & Units 10/28/2021    2:26 PM 10/21/2021    2:36 PM 10/14/2021    3:00 PM  CBC  WBC 4.0 - 10.5 K/uL 1.8  2.4  3.5   Hemoglobin 13.0 - 17.0 g/dL 8.9  10.1  10.7   Hematocrit 39.0 - 52.0 % 24.8  28.8  30.4   Platelets 150 - 400 K/uL 123  139  165         Latest Ref Rng & Units 10/28/2021    2:26 PM 10/21/2021    2:36 PM 10/14/2021    3:00 PM  CMP  Glucose 70 - 99 mg/dL 139  101  96   BUN 6 - 20 mg/dL '13  18  22   '$ Creatinine 0.61 - 1.24 mg/dL 0.74  0.72  0.64   Sodium 135 - 145 mmol/L 132  138  135   Potassium 3.5 - 5.1 mmol/L 4.1  4.2  4.2   Chloride 98 - 111 mmol/L 94  100  100   CO2 22 - 32 mmol/L 31  32  30   Calcium 8.9 - 10.3 mg/dL 9.5  9.4  9.1        RADIOGRAPHIC STUDIES: I have personally reviewed the radiological images as listed and agreed with the findings in the report. No images are attached to the encounter. No results found.   ASSESSMENT & PLAN: Patient is a 46 y.o. 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 Chryl Heck.  I have viewed most recent oncology note as well as ENT note and lab work.   #)Cancer Related Pain- Paint having severe pain unrelieved with home medications. Patient given IV pain medications to include morphine and dilaudid. Reassessed patient after medications and he is feeling improved. He is agreeable with plan to have  radiation today now that he can tolerate pain. Patient would benefit from palliative care evaluation to assist with pain management. Patient will return to clinic tomorrow establish care with palliative care team. Appreciate their assistance in quickly seeing the patient.  Visit Diagnosis: 1. Primary cancer of tonsillar fossa ([place])   2. Cancer associated pain      Orders Placed This Encounter  Procedures   Amb Referral to Palliative Care    Referral Priority:   Routine    Referral Type:   Consultation    Referral Reason:   Symptom Managment    Referred to Provider:   Jimmy Footman, NP    Number of Visits Requested:   1    All questions were answered. The patient knows to call the clinic with any problems, questions or concerns. No barriers to learning was detected.  I have  spent a total of 10 minutes minutes of face-to-face and non-face-to-face time, preparing to see the patient, obtaining and/or reviewing separately obtained history, performing a medically appropriate examination, counseling and educating the patient, documenting clinical information in the electronic health record, and care coordination.     Thank you for allowing me to participate in the care of this patient.    Barrie Folk, PA-C Department of Hematology/Oncology [facility] at [facility] Phone: [phone]  [phone]    10/29/2021 4:55 PM

## 2021-10-29 NOTE — Patient Instructions (Signed)
[facility] MEDICAL ONCOLOGY  Discharge Instructions: Thank you for choosing [facility] to provide your oncology and hematology care.   If you have a lab appointment with the Cancer Center, please go directly to the Cancer Center and check in at the registration area.   Wear comfortable clothing and clothing appropriate for easy access to any Portacath or PICC line.   We strive to give you quality time with your provider. You may need to reschedule your appointment if you arrive late (15 or more minutes).  Arriving late affects you and other patients whose appointments are after yours.  Also, if you miss three or more appointments without notifying the office, you may be dismissed from the clinic at the provider's discretion.      For prescription refill requests, have your pharmacy contact our office and allow 72 hours for refills to be completed.    Today you received the following chemotherapy and/or immunotherapy agents : Cisplatin    To help prevent nausea and vomiting after your treatment, we encourage you to take your nausea medication as directed.  BELOW ARE SYMPTOMS THAT SHOULD BE REPORTED IMMEDIATELY: *FEVER GREATER THAN 100.4 F (38 C) OR HIGHER *CHILLS OR SWEATING *NAUSEA AND VOMITING THAT IS NOT CONTROLLED WITH YOUR NAUSEA MEDICATION *UNUSUAL SHORTNESS OF BREATH *UNUSUAL BRUISING OR BLEEDING *URINARY PROBLEMS (pain or burning when urinating, or frequent urination) *BOWEL PROBLEMS (unusual diarrhea, constipation, pain near the anus) TENDERNESS IN MOUTH AND THROAT WITH OR WITHOUT PRESENCE OF ULCERS (sore throat, sores in mouth, or a toothache) UNUSUAL RASH, SWELLING OR PAIN  UNUSUAL VAGINAL DISCHARGE OR ITCHING   Items with * indicate a potential emergency and should be followed up as soon as possible or go to the Emergency Department if any problems should occur.  Please show the CHEMOTHERAPY ALERT CARD or IMMUNOTHERAPY ALERT CARD at check-in to  the Emergency Department and triage nurse.  Should you have questions after your visit or need to cancel or reschedule your appointment, please contact [facility] MEDICAL ONCOLOG[facility]  Dept: [phone]  and follow the prompts.  Office hours are 8:00 a.m. to 4:30 p.m. Monday - Friday. Please note that voicemails left after 4:00 p.m. may not be returned until the following business day.  We are closed weekends and major holidays. You have access to a nurse at all times for urgent questions. Please call the main number to the clinic Dept: [phone] and follow the prompts.   For any non-urgent questions, you may also contact your provider using MyChart. We now offer e-Visits for anyone 18 and older to request care online for non-urgent symptoms. For details visit [email or web address].   Also download the MyChart app! Go to the app store, search "MyChart", open the app, select [facility], and log in with your MyChart username and password.  Due to Covid, a mask is required upon entering the hospital/clinic. If you do not have a mask, one will be given to you upon arrival. For doctor visits, patients may have 1 support person aged 18 or older with them. For treatment visits, patients cannot have anyone with them due to current Covid guidelines and our immunocompromised population.   

## 2021-10-30 ENCOUNTER — Encounter: Payer: Self-pay | Admitting: Hematology and Oncology

## 2021-10-30 ENCOUNTER — Other Ambulatory Visit: Payer: Self-pay

## 2021-10-30 ENCOUNTER — Inpatient Hospital Stay: Payer: Commercial Managed Care - PPO

## 2021-10-30 ENCOUNTER — Other Ambulatory Visit (HOSPITAL_COMMUNITY): Payer: Self-pay

## 2021-10-30 ENCOUNTER — Encounter: Payer: Self-pay | Admitting: Nurse Practitioner

## 2021-10-30 ENCOUNTER — Inpatient Hospital Stay (HOSPITAL_BASED_OUTPATIENT_CLINIC_OR_DEPARTMENT_OTHER): Payer: Commercial Managed Care - PPO | Admitting: Nurse Practitioner

## 2021-10-30 ENCOUNTER — Ambulatory Visit
Admission: RE | Admit: 2021-10-30 | Discharge: 2021-10-30 | Disposition: A | Discharge status: Home / Self Care | Payer: Commercial Managed Care - PPO | Source: Ambulatory Visit | Attending: Radiation Oncology | Admitting: Radiation Oncology

## 2021-10-30 VITALS — BP 154/99 | HR 99 | Temp 98.4°F | Resp 20 | Wt 161.9 lb

## 2021-10-30 DIAGNOSIS — C090 Malignant neoplasm of tonsillar fossa: Secondary | ICD-10-CM | POA: Diagnosis not present

## 2021-10-30 DIAGNOSIS — R6889 Other general symptoms and signs: Secondary | ICD-10-CM

## 2021-10-30 DIAGNOSIS — C113 Malignant neoplasm of anterior wall of nasopharynx: Secondary | ICD-10-CM

## 2021-10-30 DIAGNOSIS — R233 Spontaneous ecchymoses: Secondary | ICD-10-CM | POA: Diagnosis not present

## 2021-10-30 DIAGNOSIS — Z95828 Presence of other vascular implants and grafts: Secondary | ICD-10-CM

## 2021-10-30 DIAGNOSIS — Z515 Encounter for palliative care: Secondary | ICD-10-CM

## 2021-10-30 DIAGNOSIS — G893 Neoplasm related pain (acute) (chronic): Secondary | ICD-10-CM

## 2021-10-30 DIAGNOSIS — K1231 Oral mucositis (ulcerative) due to antineoplastic therapy: Secondary | ICD-10-CM | POA: Diagnosis not present

## 2021-10-30 DIAGNOSIS — Z800 Family history of malignant neoplasm of digestive organs: Secondary | ICD-10-CM

## 2021-10-30 DIAGNOSIS — R530 Neoplastic (malignant) related fatigue: Secondary | ICD-10-CM

## 2021-10-30 DIAGNOSIS — Z5111 Encounter for antineoplastic chemotherapy: Secondary | ICD-10-CM | POA: Diagnosis not present

## 2021-10-30 DIAGNOSIS — Z87891 Personal history of nicotine dependence: Secondary | ICD-10-CM

## 2021-10-30 LAB — RAD ONC ARIA SESSION SUMMARY
Course Elapsed Days: 43
Plan Fractions Treated to Date: 30
Plan Prescribed Dose Per Fraction: 2 Gy
Plan Total Fractions Prescribed: 35
Plan Total Prescribed Dose: 70 Gy
Reference Point Dosage Given to Date: 60 Gy
Reference Point Session Dosage Given: 2 Gy
Session Number: 30

## 2021-10-30 MED ORDER — SODIUM CHLORIDE 0.9 % IV SOLN
10.0000 mg | Freq: Once | INTRAVENOUS | Status: DC
  Filled 2021-10-30: qty 1

## 2021-10-30 MED ORDER — HYDROMORPHONE HCL 1 MG/ML IJ SOLN
1.0000 mg | Freq: Once | INTRAMUSCULAR | Status: AC
  Administered 2021-10-30: 1 mg via INTRAVENOUS
  Filled 2021-10-30: qty 1

## 2021-10-30 MED ORDER — HYOSCYAMINE SULFATE 0.125 MG PO TBDP
0.1250 mg | ORAL_TABLET | Freq: Four times a day (QID) | ORAL | 0 refills | Status: DC | PRN
Start: 2021-10-30 — End: 2021-11-10
  Filled 2021-10-30: qty 30, 8d supply, fill #0

## 2021-10-30 MED ORDER — HYDROMORPHONE HCL 1 MG/ML IJ SOLN
2.0000 mg | Freq: Once | INTRAMUSCULAR | Status: AC
  Administered 2021-10-30: 2 mg via INTRAVENOUS
  Filled 2021-10-30: qty 2

## 2021-10-30 MED ORDER — HYDROCODONE-ACETAMINOPHEN 5-325 MG PO TABS
1.0000 | ORAL_TABLET | ORAL | 0 refills | Status: DC | PRN
  Filled 2021-10-31: qty 90, 8d supply, fill #0

## 2021-10-30 MED ORDER — HEPARIN SOD (PORK) LOCK FLUSH 100 UNIT/ML IV SOLN
500.0000 [IU] | Freq: Once | INTRAVENOUS | Status: AC
  Administered 2021-10-30: 500 [IU]

## 2021-10-30 MED ORDER — HYDROMORPHONE HCL 1 MG/ML IJ SOLN: 1.0000 mg | Freq: Once | INTRAMUSCULAR | Status: DC

## 2021-10-30 MED ORDER — SODIUM CHLORIDE 0.9 % IV SOLN: Freq: Once | INTRAVENOUS | Status: AC

## 2021-10-30 MED ORDER — HYOSCYAMINE SULFATE 0.125 MG/5ML PO ELIX
0.1250 mg | ORAL_SOLUTION | Freq: Four times a day (QID) | ORAL | 0 refills | Status: DC | PRN
  Filled 2021-10-30: qty 473, 24d supply, fill #0

## 2021-10-30 MED ORDER — SODIUM CHLORIDE 0.9% FLUSH
10.0000 mL | Freq: Once | INTRAVENOUS | Status: AC
  Administered 2021-10-30: 10 mL

## 2021-10-30 NOTE — Progress Notes (Signed)
Pain meds a fluids ordered for [facility] tomorrow

## 2021-10-30 NOTE — Progress Notes (Signed)
This RN unintentionally released the '2mg'$  dose of dilaudid intended for tomorrow (6/16) [facility] visit instead of '1mg'$  dose for today. Ginna, RPH, made aware and order changed in [facility] for '1mg'$  dose. Maygan, RN, made aware so orders can be updated for tomorrow's visit.

## 2021-10-30 NOTE — Progress Notes (Signed)
[place]  PROGRESS NOTE  Patient Care Team: Patient, No Pcp Per as PCP - General (General Practice) Powers, Sheppard Coil, MD as Referring Physician (Neurosurgery) Malmfelt, Stephani Police, RN as Oncology Nurse Navigator Eppie Gibson, MD as Consulting Physician (Radiation Oncology) Benay Pike, MD as Consulting Physician (Hematology and Oncology)  CHIEF COMPLAINTS/PURPOSE OF CONSULTATION:  SCC oropharynx  Oncology History  Primary cancer of tonsillar fossa ([place])  08/19/2021 Imaging   Patient complained of spitting chunks of tissue hence had a CT maxillofacial with contrast as well as with the neck with contrast.  Mucosal hyperenhancement in the right nasopharynx extending to the oropharynx with mostly soft tissue density in the region of right palatine tonsil most suspicious for malignancy.  There is also a 1.8 x 1.5 x 2.4 cm peripherally enhancing hyperintense lesion in the right retropharynx suspicious for necrotic lymph node with additional prominent right level 2 lymph nodes measuring up to 1.3 cm in short axis with suspected extracapsular extension at level to me.   08/27/2021 Initial Diagnosis   Primary cancer of tonsillar fossa ([place])   09/01/2021 PET scan   PET scan from April 17 showed locally advanced right palatine tonsil primary with ipsilateral cervical nodal metastasis no evidence of hypermetabolic extracervical metastasis.  Tiny pulmonary nodules below PET resolution consider chest CT follow-up at 6 months   09/03/2021 Cancer Staging   Staging form: Pharynx - P16 Negative Oropharynx, AJCC 8th Edition - Clinical stage from 09/03/2021: Stage IVA (cT3, cN2b, cM0, p16-) - Signed by Eppie Gibson, MD on 09/03/2021 Stage prefix: Initial diagnosis   09/17/2021 -  Chemotherapy   Patient is on Treatment Plan : HEAD/NECK Cisplatin q7d      INTERIM HISTORY: Johnny Mata is returns today for a follow up visit prior to cycle 7 of cisplatin. He is unaccompanied for this visit.  He is struggling with persistent pain in his mouth, not improved with Vidocin. He continues to have excessive secretions which causes him to choke/regurgitate. This prevents him from sleeping. He is compliant with wearing scopolamine patch. He has mucositis but symptoms are improved with lidocaine mouthwash. He denies nausea, vomiting. His bowel habits are unchanged with a BM every 2 days. He denies easy bruising or signs of bleeding. He reports intermittent episodes of fevers, with Tmax of 100 F yesterday. He denies fevers today. He denies  His continues to have tinnitus which was present before chemotherapy and hasnt gotten worse. He has no other complaints.   Rest of the pertinent 10 point ROS reviewed and negative  MEDICAL HISTORY:  Past Medical History:  Diagnosis Date   Anxiety    Never officially diagnosed; does not take medication for anxiety   Asthma    Hasn't had an episode since childhood   Depression     SURGICAL HISTORY: Past Surgical History:  Procedure Laterality Date   ELBOW SURGERY Bilateral    HIP SURGERY Left    IR GASTROSTOMY TUBE MOD SED  09/11/2021   IR IMAGING GUIDED PORT INSERTION  09/11/2021   NECK SURGERY  05/09/2018   Cervical fusion (Dr. Atilano Ina)   PANENDOSCOPY N/A 08/19/2021   Procedure: PANENDOSCOPY WITH BIPOSY--direct laryngoscopy, flexible bronchoscopy, rigid esophagoscopy, biopsy of lesion in throat;  Surgeon: Marcina Millard, MD;  Location: [facility];  Service: ENT;  Laterality: N/A;   PLANTAR FASCIA SURGERY     SHOULDER SURGERY Left    TONSILLECTOMY     WISDOM TOOTH EXTRACTION Bilateral 1995    SOCIAL HISTORY: Social  History   Socioeconomic History   Marital status: Single    Spouse name: Not on file   Number of children: Not on file   Years of education: Not on file   Highest education level: Not on file  Occupational History   Not on file  Tobacco Use   Smoking status: Former    Packs/day: 1.50    Years: 20.00    Total pack  years: 30.00    Types: Cigarettes    Quit date: 10/02/2019    Years since quitting: 2.0   Smokeless tobacco: Never  Vaping Use   Vaping Use: Never used  Substance and Sexual Activity   Alcohol use: Not Currently    Alcohol/week: 7.0 standard drinks of alcohol    Types: 7 Shots of liquor per week   Drug use: Yes    Types: Marijuana   Sexual activity: Not Currently  Other Topics Concern   Not on file  Social History Narrative   Not on file   Social Determinants of Health   Financial Resource Strain: Medium Risk (09/12/2021)   Overall Financial Resource Strain (CARDIA)    Difficulty of Paying Living Expenses: Somewhat hard  Food Insecurity: Not on file  Transportation Needs: No Transportation Needs (09/12/2021)   PRAPARE - Transportation    Lack of Transportation (Medical): No    Lack of Transportation (Non-Medical): No  Physical Activity: Not on file  Stress: Not on file  Social Connections: Not on file  Intimate Partner Violence: Not on file    FAMILY HISTORY: Family History  Problem Relation Age of Onset   Colon cancer Mother     ALLERGIES:  is allergic to dust mite extract, other, and pollen extract-tree extract [pollen extract].  MEDICATIONS:  Current Outpatient Medications  Medication Sig Dispense Refill   atropine 0.08 mg/mL SOLN Take 1 mL (0.08 mg total) by mouth 3 (three) times daily. For excessive secretions. 120 mL 0   fentaNYL (DURAGESIC) 12 MCG/HR Place 1 patch onto the skin every 3 (three) days. 5 patch 0   dexamethasone (DECADRON) 4 MG tablet Take 2 tablets (8 mg total) by mouth daily. Take daily x 3 days starting the day after cisplatin chemotherapy. Take with food. 30 tablet 1   diphenhydrAMINE (BENADRYL) 25 mg capsule Take 1 capsule (25 mg total) by mouth daily as needed for sleep. 30 capsule 0   HYDROcodone-acetaminophen (NORCO/VICODIN) 5-325 MG tablet Take 1-2 tablets by mouth every 6 (six) hours as needed for moderate pain. 60 tablet 0   ibuprofen  (ADVIL) 200 MG tablet Take 400 mg by mouth See admin instructions. Pt states they are taking 400 mg every 2-3 hours as needed.     Ibuprofen-diphenhydrAMINE Cit (IBUPROFEN PM PO) Take 2 tablets by mouth at bedtime.     lidocaine (XYLOCAINE) 2 % solution Patient: Mix 1part 2% viscous lidocaine, 1part H20. Swish & swallow 10m of diluted mixture, 372m before meals and at bedtime, up to four times daily 200 mL 3   lidocaine-prilocaine (EMLA) cream Apply to affected area once 30 g 3   Nutritional Supplements (KATE FARMS STANDARD 1.4) LIQD 6 cartons via tube split over four feedings. Flush tube with 90 ml water before and after feedings. Drink by mouth or give via tube additional 2 cups water. This provides 2730 kcal, 120 grams protein, 1404 ml free water (2598 ml total water with flushes). Meets 100% needs      omeprazole (FIRST-OMEPRAZOLE) 2 mg/mL SUSP oral suspension Place 10 mLs (  20 mg total) into feeding tube daily. To prevent heartburn. 300 mL 2   Omeprazole-Sodium Bicarbonate (KONVOMEP) 2-84 MG/ML SUSR Take 10 mls by feeding tube once daily to prevent heartburn 300 mL 2   ondansetron (ZOFRAN-ODT) 8 MG disintegrating tablet Take 1 tablet (8 mg total) by mouth every 8 (eight) hours as needed for nausea or vomiting (Take around the clock prophylactically if nausea is severe.). 60 tablet 3   prochlorperazine (COMPAZINE) 10 MG tablet Take 1 tablet (10 mg total) by mouth every 6 (six) hours as needed (Nausea or vomiting). 30 tablet 1   scopolamine (TRANSDERM-SCOP) 1 MG/3DAYS Place 1 patch (1.5 mg total) onto the skin every 3 (three) days. To help with thick secretions. 10 patch 2   sertraline (ZOLOFT) 100 MG tablet Take 100 mg by mouth daily.     sucralfate (CARAFATE) 1 g tablet Dissolve 1 tablet in 10 mL H20 and swallow 10-20 min prior to meals and bedtime up to QID PRN soreness. 40 tablet 3   traZODone (DESYREL) 50 MG tablet Take 50 mg by mouth at bedtime. Able to take 50-'200mg'$  as needed for sleep      No current facility-administered medications for this visit.     PHYSICAL EXAMINATION: ECOG PERFORMANCE STATUS: 0 - Asymptomatic  Vitals:   10/28/21 1505  BP: 137/89  Pulse: 97  Resp: 18  Temp: 99.3 F (37.4 C)  SpO2: 100%      Filed Weights   10/28/21 1505  Weight: 161 lb 1.6 oz (73.1 kg)   GENERAL:alert, no distress and comfortable Mouth:Mucositis posterior oropharynx Skin: Radiation changes noted. Chest: Clear to auscultation bilaterally, rate rhythm regular Lower extremities: No lower extremity edema   LABORATORY DATA:  I have reviewed the data as listed Lab Results  Component Value Date   WBC 1.8 (L) 10/28/2021   HGB 8.9 (L) 10/28/2021   HCT 24.8 (L) 10/28/2021   MCV 89.2 10/28/2021   PLT 123 (L) 10/28/2021     Chemistry      Component Value Date/Time   NA 132 (L) 10/28/2021 1426   K 4.1 10/28/2021 1426   CL 94 (L) 10/28/2021 1426   CO2 31 10/28/2021 1426   BUN 13 10/28/2021 1426   CREATININE 0.74 10/28/2021 1426      Component Value Date/Time   CALCIUM 9.5 10/28/2021 1426   ALKPHOS 57 08/19/2021 1012   AST 16 08/19/2021 1012   ALT 12 08/19/2021 1012   BILITOT 0.5 08/19/2021 1012       RADIOGRAPHIC STUDIES: I have personally reviewed the radiological images as listed and agreed with the findings in the report. No results found.  All questions were answered. The patient knows to call the clinic with any problems, questions or concerns   ASSESSMENT & PLAN:  Johnny Mata returns for a follow up for squamous cell carcinoma of the oropharynx.   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  --Patient presents today for a follow up visit prior to Cycle 7 of cisplatin tomorrow.  -- Labs from today were reviewed and adequate for treatment tomorrow. WBC 1.8, Hgb 8.9, Plt 123, ANC 1.3.  --Okay to proceed with treatment tomorrow. RTC in one  week for a follow up visit to see Dr. Chryl Heck.   #Cancer related pain: --Currently crushing two Vicodin tablets via G-tube every 6 hours. Pain is not well controlled so added fentanyl 12.5 mcg patch.   #Mucositis:  --  Secondary to radiation --Currently using lidocaine mouthwash, sent refill  #Excessive secretions: --Currently on scopolamine patch with persistent symptoms.  --sent atropine drops.   Follow up: --RTC in 1 week with labs to see Dr. Chryl Heck  Patient expressed understanding of the plan provided  I have spent a total of 30 minutes minutes of face-to-face and non-face-to-face time, preparing to see the patient, performing a medically appropriate examination, counseling and educating the patient, ordering medications/tests,  documenting clinical information in the electronic health record, and care coordination.   Dede Query PA-C Dept of Hematology and [place] at [facility] Phone: [phone]

## 2021-10-31 ENCOUNTER — Other Ambulatory Visit: Payer: Self-pay

## 2021-10-31 ENCOUNTER — Other Ambulatory Visit: Payer: Self-pay | Admitting: Nurse Practitioner

## 2021-10-31 ENCOUNTER — Encounter: Payer: Self-pay | Admitting: Hematology and Oncology

## 2021-10-31 ENCOUNTER — Inpatient Hospital Stay: Payer: Commercial Managed Care - PPO

## 2021-10-31 ENCOUNTER — Other Ambulatory Visit (HOSPITAL_COMMUNITY): Payer: Self-pay

## 2021-10-31 ENCOUNTER — Ambulatory Visit
Admission: RE | Admit: 2021-10-31 | Discharge: 2021-10-31 | Disposition: A | Discharge status: Home / Self Care | Payer: Commercial Managed Care - PPO | Source: Ambulatory Visit | Attending: Radiation Oncology | Admitting: Radiation Oncology

## 2021-10-31 VITALS — BP 129/83 | HR 97 | Resp 16

## 2021-10-31 DIAGNOSIS — C113 Malignant neoplasm of anterior wall of nasopharynx: Secondary | ICD-10-CM

## 2021-10-31 DIAGNOSIS — C090 Malignant neoplasm of tonsillar fossa: Secondary | ICD-10-CM

## 2021-10-31 DIAGNOSIS — E860 Dehydration: Secondary | ICD-10-CM

## 2021-10-31 DIAGNOSIS — G893 Neoplasm related pain (acute) (chronic): Secondary | ICD-10-CM

## 2021-10-31 DIAGNOSIS — Z95828 Presence of other vascular implants and grafts: Secondary | ICD-10-CM

## 2021-10-31 DIAGNOSIS — Z5111 Encounter for antineoplastic chemotherapy: Secondary | ICD-10-CM | POA: Diagnosis not present

## 2021-10-31 LAB — RAD ONC ARIA SESSION SUMMARY
Course Elapsed Days: 44
Plan Fractions Treated to Date: 31
Plan Prescribed Dose Per Fraction: 2 Gy
Plan Total Fractions Prescribed: 35
Plan Total Prescribed Dose: 70 Gy
Reference Point Dosage Given to Date: 62 Gy
Reference Point Session Dosage Given: 2 Gy
Session Number: 31

## 2021-10-31 MED ORDER — SODIUM CHLORIDE 0.9 % IV SOLN: Freq: Once | INTRAVENOUS | Status: AC

## 2021-10-31 MED ORDER — SODIUM CHLORIDE 0.9% FLUSH
10.0000 mL | Freq: Once | INTRAVENOUS | Status: AC
  Administered 2021-10-31: 10 mL

## 2021-10-31 MED ORDER — HEPARIN SOD (PORK) LOCK FLUSH 100 UNIT/ML IV SOLN
500.0000 [IU] | Freq: Once | INTRAVENOUS | Status: AC
  Administered 2021-10-31: 500 [IU]

## 2021-10-31 MED ORDER — HYDROMORPHONE HCL 1 MG/ML IJ SOLN
2.0000 mg | Freq: Once | INTRAMUSCULAR | Status: AC
  Administered 2021-10-31: 2 mg via INTRAVENOUS
  Filled 2021-10-31: qty 2

## 2021-10-31 NOTE — Progress Notes (Signed)
[place]  Telephone:([phone]) [phone] [phone]) [phone]   Name: Johnny Mata Date: 10/31/2021 MRN: [id number]  DOB: 10-07-1975  Patient Care Team: Patient, No Pcp Per as PCP - General (General Practice) Atilano Ina, MD as Referring Physician (Neurosurgery) Malmfelt, Stephani Police, RN as Oncology Nurse Navigator Eppie Gibson, MD as Consulting Physician (Radiation Oncology) Benay Pike, MD as Consulting Physician (Hematology and Oncology)    REASON FOR CONSULTATION: Johnny Mata is a 46 y.o. male with medical history including tonsillar fossa (08/2021) with cervical nodal metastasis currently receiving treatment (Cisplatin) and radiation, PEG tube in place. Palliative ask to see for symptom management.    SOCIAL HISTORY:     reports that he quit smoking about 2 years ago. His smoking use included cigarettes. He has a 30.00 pack-year smoking history. He has never used smokeless tobacco. He reports that he does not currently use alcohol after a past usage of about 7.0 standard drinks of alcohol per week. He reports current drug use. Drug: Marijuana.  ADVANCE DIRECTIVES:    CODE STATUS:   PAST MEDICAL HISTORY: Past Medical History:  Diagnosis Date   Anxiety    Never officially diagnosed; does not take medication for anxiety   Asthma    Hasn't had an episode since childhood   Depression     PAST SURGICAL HISTORY:  Past Surgical History:  Procedure Laterality Date   ELBOW SURGERY Bilateral    HIP SURGERY Left    IR GASTROSTOMY TUBE MOD SED  09/11/2021   IR IMAGING GUIDED PORT INSERTION  09/11/2021   NECK SURGERY  05/09/2018   Cervical fusion (Dr. Atilano Ina)   PANENDOSCOPY N/A 08/19/2021   Procedure: PANENDOSCOPY WITH BIPOSY--direct laryngoscopy, flexible bronchoscopy, rigid esophagoscopy, biopsy of lesion in throat;  Surgeon: Marcina Millard, MD;  Location: [facility];  Service: ENT;  Laterality: N/A;   PLANTAR FASCIA  SURGERY     SHOULDER SURGERY Left    TONSILLECTOMY     WISDOM TOOTH EXTRACTION Bilateral 1995    HEMATOLOGY/ONCOLOGY HISTORY:  Oncology History  Primary cancer of tonsillar fossa ([place])  08/19/2021 Imaging   Patient complained of spitting chunks of tissue hence had a CT maxillofacial with contrast as well as with the neck with contrast.  Mucosal hyperenhancement in the right nasopharynx extending to the oropharynx with mostly soft tissue density in the region of right palatine tonsil most suspicious for malignancy.  There is also a 1.8 x 1.5 x 2.4 cm peripherally enhancing hyperintense lesion in the right retropharynx suspicious for necrotic lymph node with additional prominent right level 2 lymph nodes measuring up to 1.3 cm in short axis with suspected extracapsular extension at level to me.   08/27/2021 Initial Diagnosis   Primary cancer of tonsillar fossa ([place])   09/01/2021 PET scan   PET scan from April 17 showed locally advanced right palatine tonsil primary with ipsilateral cervical nodal metastasis no evidence of hypermetabolic extracervical metastasis.  Tiny pulmonary nodules below PET resolution consider chest CT follow-up at 6 months   09/03/2021 Cancer Staging   Staging form: Pharynx - P16 Negative Oropharynx, AJCC 8th Edition - Clinical stage from 09/03/2021: Stage IVA (cT3, cN2b, cM0, p16-) - Signed by Eppie Gibson, MD on 09/03/2021 Stage prefix: Initial diagnosis   09/17/2021 -  Chemotherapy   Patient is on Treatment Plan : HEAD/NECK Cisplatin q7d       ALLERGIES:  is allergic to dust mite extract, other, and pollen extract-tree extract [  pollen extract].  MEDICATIONS:  Current Outpatient Medications  Medication Sig Dispense Refill   hyoscyamine (ANASPAZ) 0.125 MG TBDP disintergrating tablet Place 1 tablet (0.125 mg total) under the tongue every 6 (six) hours as needed (secretions). 30 tablet 0   atropine 0.08 mg/mL SOLN Take 1 mL (0.08 mg total) by mouth 3 (three) times daily.  For excessive secretions. 120 mL 0   dexamethasone (DECADRON) 4 MG tablet Take 2 tablets (8 mg total) by mouth daily. Take daily x 3 days starting the day after cisplatin chemotherapy. Take with food. 30 tablet 1   diphenhydrAMINE (BENADRYL) 25 mg capsule Take 1 capsule (25 mg total) by mouth daily as needed for sleep. 30 capsule 0   fentaNYL (DURAGESIC) 12 MCG/HR Place 1 patch onto the skin every 3 (three) days. 5 patch 0   HYDROcodone-acetaminophen (NORCO/VICODIN) 5-325 MG tablet Take 1-2 tablets by mouth every 4 (four) hours as needed for moderate pain. 90 tablet 0   ibuprofen (ADVIL) 200 MG tablet Take 400 mg by mouth See admin instructions. Pt states they are taking 400 mg every 2-3 hours as needed.     Ibuprofen-diphenhydrAMINE Cit (IBUPROFEN PM PO) Take 2 tablets by mouth at bedtime.     lidocaine (XYLOCAINE) 2 % solution Patient: Mix 1part 2% viscous lidocaine, 1part H20. Swish & swallow 32m of diluted mixture, 324m before meals and at bedtime, up to four times daily 200 mL 3   lidocaine-prilocaine (EMLA) cream Apply to affected area once 30 g 3   Nutritional Supplements (KATE FARMS STANDARD 1.4) LIQD 6 cartons via tube split over four feedings. Flush tube with 90 ml water before and after feedings. Drink by mouth or give via tube additional 2 cups water. This provides 2730 kcal, 120 grams protein, 1404 ml free water (2598 ml total water with flushes). Meets 100% needs      omeprazole (FIRST-OMEPRAZOLE) 2 mg/mL SUSP oral suspension Place 10 mLs (20 mg total) into feeding tube daily. To prevent heartburn. 300 mL 2   Omeprazole-Sodium Bicarbonate (KONVOMEP) 2-84 MG/ML SUSR Take 10 mls by feeding tube once daily to prevent heartburn 300 mL 2   ondansetron (ZOFRAN-ODT) 8 MG disintegrating tablet Take 1 tablet (8 mg total) by mouth every 8 (eight) hours as needed for nausea or vomiting (Take around the clock prophylactically if nausea is severe.). 60 tablet 3   oxyCODONE-acetaminophen (PERCOCET)  10-325 MG tablet Take 1 tablet by mouth every 4 (four) hours as needed.     prochlorperazine (COMPAZINE) 10 MG tablet Take 1 tablet (10 mg total) by mouth every 6 (six) hours as needed (Nausea or vomiting). 30 tablet 1   scopolamine (TRANSDERM-SCOP) 1 MG/3DAYS Place 1 patch (1.5 mg total) onto the skin every 3 (three) days. To help with thick secretions. 10 patch 2   sertraline (ZOLOFT) 100 MG tablet Take 100 mg by mouth daily.     sucralfate (CARAFATE) 1 g tablet Dissolve 1 tablet in 10 mL H20 and swallow 10-20 min prior to meals and bedtime up to QID PRN soreness. 40 tablet 3   traZODone (DESYREL) 50 MG tablet Take 50 mg by mouth at bedtime. Able to take 50-'200mg'$  as needed for sleep     No current facility-administered medications for this visit.    VITAL SIGNS: There were no vitals taken for this visit. There were no vitals filed for this visit.  Estimated body mass index is 21.96 kg/m as calculated from the following:   Height as of 10/28/21: 6' (  1.829 m).   Weight as of an earlier encounter on 10/30/21: 161 lb 14.4 oz (73.4 kg).  LABS: CBC:    Component Value Date/Time   WBC 1.8 (L) 10/28/2021 1426   WBC 7.8 09/11/2021 0945   HGB 8.9 (L) 10/28/2021 1426   HCT 24.8 (L) 10/28/2021 1426   PLT 123 (L) 10/28/2021 1426   MCV 89.2 10/28/2021 1426   NEUTROABS 1.3 (L) 10/28/2021 1426   LYMPHSABS 0.1 (L) 10/28/2021 1426   MONOABS 0.4 10/28/2021 1426   EOSABS 0.0 10/28/2021 1426   BASOSABS 0.0 10/28/2021 1426   Comprehensive Metabolic Panel:    Component Value Date/Time   NA 132 (L) 10/28/2021 1426   K 4.1 10/28/2021 1426   CL 94 (L) 10/28/2021 1426   CO2 31 10/28/2021 1426   BUN 13 10/28/2021 1426   CREATININE 0.74 10/28/2021 1426   GLUCOSE 139 (H) 10/28/2021 1426   CALCIUM 9.5 10/28/2021 1426   AST 16 08/19/2021 1012   ALT 12 08/19/2021 1012   ALKPHOS 57 08/19/2021 1012   BILITOT 0.5 08/19/2021 1012   PROT 6.8 08/19/2021 1012   ALBUMIN 4.1 08/19/2021 1012     RADIOGRAPHIC STUDIES: No results found.  PERFORMANCE STATUS (ECOG) : 1 - Symptomatic but completely ambulatory  Review of Systems  Constitutional:  Positive for fatigue.  HENT:  Positive for sore throat.        Oral secretions, mucositis   Unless otherwise noted, a complete review of systems is negative.  Physical Exam General: NAD, well-developed  Cardiovascular: regular rate and rhythm Pulmonary: clear ant fields HEENT: oral secretions (able to maintain) pain with talking  Abdomen: no distension, PEG tube in place GU: no suprapubic tenderness Extremities: no edema, no joint deformities Skin: no rashes Neurological: AAO x3, mood appropriate  IMPRESSION: This is my initial visit with Johnny Mata. He was seen while in symptom management. Awake and alert. Able to engage in discussions however with some limitations due to pain when speaking.   I introduced myself, [place] RN, and Palliative's role in collaboration with the oncology team. Concept of Palliative Care was introduced as specialized medical care for people and their families living with serious illness.  It focuses on providing relief from the symptoms and stress of a serious illness.  The goal is to improve quality of life for both the patient and the family. Values and goals of care important to patient and family were attempted to be elicited.   Johnny Mata lives alone. He has a sister for support who lives in [place]. He is on medical leave currently. He is works in Control and instrumentation engineer.   Is able to perform all ADLs independently. Manages PEG tube feedings and medications.   Neoplasm related pain Sung is complaining of ongoing pain. He speaks in short sentences due to pain. Observed grimacing and tearful during discussion. We discussed at length his current pain regimen. He recently started on fentanyl 12 mcg patch (10/28/2021). He reports some improvement however pain is still intense. Tolerating Oxycodone/APAP as needed but is  taking around the clock. Reports pain begins to intensify in 4-5 hours. Discussed increasing frequency to every 4-6 hours as needed until pain is better controlled. He verbalized understanding.   He has contemplated missing radiation treatments on days that his pain is severe. Agreeable to allow for symptom management and taking medication prior treatment to allow him to tolerate.    Will continue with close monitory and adjust as needed. He has only been on fentanyl  patch for 3 days. We will plan to follow-up in a week and access pain at that time.   Excessive secretions   Some improvement in symptoms with use of scopolamine patch. Observed during visit continuously spitting out secretions and wiping mouth with wash cloth.   We discussed ways to manage secretions. Education provided on use of atropine vs levsin. He verbalized understanding and is open to any available options.   I discussed the importance of continued conversation with family and their medical providers regarding overall plan of care and treatment options, ensuring decisions are within the context of the patients values and GOCs.  PLAN: Established therapeutic relationship. Education provided on palliative's role in collaboration with his oncology team.  Continue fentanyl patch 72mg as prescribed  Oxycodone/APAP 10 mg every 4 hours as needed for breakthrough pain.  Hyoscyamine 0.125 every 6 hours as needed for excessive secretions per tube Scopolamine patch as prescribed IV fluids and pain medications prior to radiation. Final treatment 6/22.  Will continue to closely monitor symptom as adjust accordingly.  I will plan to see patient back in a week in collaboration to other oncology appointments.    Patient expressed understanding and was in agreement with this plan. He also understands that He can call the clinic at any time with any questions, concerns, or complaints.   Thank you for your referral and allowing  Palliative to assist in Mr. JSAMAD THONcare.   Number and complexity of problems addressed: 3 HIGH - 1 or more chronic illnesses with SEVERE exacerbation, progression, or side effects of treatment - advanced cancer, pain. Any controlled substances utilized were prescribed in the context of palliative care.  Time Total: 50 min   Visit consisted of counseling and education dealing with the complex and emotionally intense issues of symptom management and palliative care in the setting of serious and potentially life-threatening illness.Greater than 50%  of this time was spent counseling and coordinating care related to the above assessment and plan.  Signed by: NAlda Lea AGPCNP-BC Palliative Medicine Team/[place]

## 2021-11-03 ENCOUNTER — Ambulatory Visit: Payer: Commercial Managed Care - PPO

## 2021-11-03 ENCOUNTER — Inpatient Hospital Stay: Payer: Commercial Managed Care - PPO

## 2021-11-03 ENCOUNTER — Inpatient Hospital Stay (HOSPITAL_COMMUNITY)
Admission: EM | Admit: 2021-11-03 | Discharge: 2021-11-10 | DRG: 948 | Disposition: A | Discharge status: Home / Self Care | Payer: Commercial Managed Care - PPO | Attending: Internal Medicine | Admitting: Internal Medicine

## 2021-11-03 ENCOUNTER — Other Ambulatory Visit: Payer: Self-pay

## 2021-11-03 ENCOUNTER — Encounter (HOSPITAL_COMMUNITY): Payer: Self-pay

## 2021-11-03 DIAGNOSIS — R634 Abnormal weight loss: Secondary | ICD-10-CM | POA: Diagnosis present

## 2021-11-03 DIAGNOSIS — Z6821 Body mass index (BMI) 21.0-21.9, adult: Secondary | ICD-10-CM

## 2021-11-03 DIAGNOSIS — E46 Unspecified protein-calorie malnutrition: Secondary | ICD-10-CM | POA: Diagnosis present

## 2021-11-03 DIAGNOSIS — F411 Generalized anxiety disorder: Secondary | ICD-10-CM | POA: Diagnosis not present

## 2021-11-03 DIAGNOSIS — Z87891 Personal history of nicotine dependence: Secondary | ICD-10-CM

## 2021-11-03 DIAGNOSIS — T451X5A Adverse effect of antineoplastic and immunosuppressive drugs, initial encounter: Secondary | ICD-10-CM | POA: Diagnosis present

## 2021-11-03 DIAGNOSIS — R509 Fever, unspecified: Secondary | ICD-10-CM | POA: Diagnosis not present

## 2021-11-03 DIAGNOSIS — C140 Malignant neoplasm of pharynx, unspecified: Secondary | ICD-10-CM | POA: Diagnosis not present

## 2021-11-03 DIAGNOSIS — Z515 Encounter for palliative care: Secondary | ICD-10-CM

## 2021-11-03 DIAGNOSIS — T402X5A Adverse effect of other opioids, initial encounter: Secondary | ICD-10-CM | POA: Diagnosis not present

## 2021-11-03 DIAGNOSIS — D72819 Decreased white blood cell count, unspecified: Secondary | ICD-10-CM | POA: Diagnosis present

## 2021-11-03 DIAGNOSIS — K1231 Oral mucositis (ulcerative) due to antineoplastic therapy: Secondary | ICD-10-CM | POA: Diagnosis present

## 2021-11-03 DIAGNOSIS — F1010 Alcohol abuse, uncomplicated: Secondary | ICD-10-CM | POA: Diagnosis present

## 2021-11-03 DIAGNOSIS — K5903 Drug induced constipation: Secondary | ICD-10-CM | POA: Diagnosis not present

## 2021-11-03 DIAGNOSIS — D630 Anemia in neoplastic disease: Secondary | ICD-10-CM | POA: Diagnosis present

## 2021-11-03 DIAGNOSIS — Z79899 Other long term (current) drug therapy: Secondary | ICD-10-CM

## 2021-11-03 DIAGNOSIS — F32A Depression, unspecified: Secondary | ICD-10-CM | POA: Diagnosis present

## 2021-11-03 DIAGNOSIS — C090 Malignant neoplasm of tonsillar fossa: Secondary | ICD-10-CM | POA: Diagnosis present

## 2021-11-03 DIAGNOSIS — C113 Malignant neoplasm of anterior wall of nasopharynx: Secondary | ICD-10-CM | POA: Diagnosis present

## 2021-11-03 DIAGNOSIS — G893 Neoplasm related pain (acute) (chronic): Principal | ICD-10-CM | POA: Diagnosis present

## 2021-11-03 DIAGNOSIS — R5081 Fever presenting with conditions classified elsewhere: Secondary | ICD-10-CM | POA: Diagnosis not present

## 2021-11-03 DIAGNOSIS — D6481 Anemia due to antineoplastic chemotherapy: Secondary | ICD-10-CM | POA: Diagnosis present

## 2021-11-03 DIAGNOSIS — Z91199 Patient's noncompliance with other medical treatment and regimen due to unspecified reason: Secondary | ICD-10-CM

## 2021-11-03 DIAGNOSIS — Z800 Family history of malignant neoplasm of digestive organs: Secondary | ICD-10-CM

## 2021-11-03 DIAGNOSIS — D701 Agranulocytosis secondary to cancer chemotherapy: Secondary | ICD-10-CM | POA: Diagnosis present

## 2021-11-03 DIAGNOSIS — Z91048 Other nonmedicinal substance allergy status: Secondary | ICD-10-CM

## 2021-11-03 DIAGNOSIS — Z981 Arthrodesis status: Secondary | ICD-10-CM

## 2021-11-03 DIAGNOSIS — R52 Pain, unspecified: Secondary | ICD-10-CM | POA: Diagnosis not present

## 2021-11-03 DIAGNOSIS — E440 Moderate protein-calorie malnutrition: Secondary | ICD-10-CM | POA: Diagnosis present

## 2021-11-03 DIAGNOSIS — D649 Anemia, unspecified: Secondary | ICD-10-CM | POA: Diagnosis present

## 2021-11-03 HISTORY — DX: Other diseases of pharynx: J39.2

## 2021-11-03 HISTORY — DX: Malignant (primary) neoplasm, unspecified: C80.1

## 2021-11-03 LAB — COMPREHENSIVE METABOLIC PANEL
ALT: 15 U/L (ref 0–44)
AST: 12 U/L — ABNORMAL LOW (ref 15–41)
Albumin: 3.4 g/dL — ABNORMAL LOW (ref 3.5–5.0)
Alkaline Phosphatase: 44 U/L (ref 38–126)
Anion gap: 9 (ref 5–15)
BUN: 17 mg/dL (ref 6–20)
CO2: 28 mmol/L (ref 22–32)
Calcium: 8.3 mg/dL — ABNORMAL LOW (ref 8.9–10.3)
Chloride: 99 mmol/L (ref 98–111)
Creatinine, Ser: 0.61 mg/dL (ref 0.61–1.24)
GFR, Estimated: >60 mL/min (ref >60)
Glucose, Bld: 99 mg/dL (ref 70–99)
Potassium: 3.5 mmol/L (ref 3.5–5.1)
Sodium: 136 mmol/L (ref 135–145)
Total Bilirubin: 0.4 mg/dL (ref 0.3–1.2)
Total Protein: 6.2 g/dL — ABNORMAL LOW (ref 6.5–8.1)

## 2021-11-03 LAB — CBC WITH DIFFERENTIAL/PLATELET
Abs Immature Granulocytes: 0.06 10*3/uL (ref 0.00–0.07)
Basophils Absolute: 0 10*3/uL (ref 0.0–0.1)
Basophils Relative: 0 %
Eosinophils Absolute: 0 10*3/uL (ref 0.0–0.5)
Eosinophils Relative: 0 %
HCT: 24.8 % — ABNORMAL LOW (ref 39.0–52.0)
Hemoglobin: 8.6 g/dL — ABNORMAL LOW (ref 13.0–17.0)
Immature Granulocytes: 2 %
Lymphocytes Relative: 9 %
Lymphs Abs: 0.2 10*3/uL — ABNORMAL LOW (ref 0.7–4.0)
MCH: 32.5 pg (ref 26.0–34.0)
MCHC: 34.7 g/dL (ref 30.0–36.0)
MCV: 93.6 fL (ref 80.0–100.0)
Monocytes Absolute: 0.7 10*3/uL (ref 0.1–1.0)
Monocytes Relative: 28 %
Neutro Abs: 1.5 10*3/uL — ABNORMAL LOW (ref 1.7–7.7)
Neutrophils Relative %: 61 %
Platelets: 204 10*3/uL (ref 150–400)
RBC: 2.65 MIL/uL — ABNORMAL LOW (ref 4.22–5.81)
RDW: 15.4 % (ref 11.5–15.5)
WBC: 2.5 10*3/uL — ABNORMAL LOW (ref 4.0–10.5)
nRBC: 0 % (ref 0.0–0.2)

## 2021-11-03 MED ORDER — SCOPOLAMINE 1 MG/3DAYS TD PT72
1.0000 | MEDICATED_PATCH | TRANSDERMAL | Status: AC
  Administered 2021-11-03: 1.5 mg via TRANSDERMAL
  Filled 2021-11-03: qty 1

## 2021-11-03 MED ORDER — PROCHLORPERAZINE MALEATE 10 MG PO TABS
10.0000 mg | ORAL_TABLET | ORAL | Status: DC | PRN
  Administered 2021-11-04 – 2021-11-05 (×2): 10 mg
  Filled 2021-11-03 (×2): qty 1

## 2021-11-03 MED ORDER — FENTANYL 12 MCG/HR TD PT72
1.0000 | MEDICATED_PATCH | TRANSDERMAL | Status: DC
  Administered 2021-11-03: 1 via TRANSDERMAL
  Filled 2021-11-03: qty 1

## 2021-11-03 MED ORDER — POTASSIUM CHLORIDE IN NACL 20-0.9 MEQ/L-% IV SOLN
INTRAVENOUS | Status: DC
  Filled 2021-11-03 (×8): qty 1000

## 2021-11-03 MED ORDER — LIDOCAINE VISCOUS HCL 2 % MT SOLN
15.0000 mL | OROMUCOSAL | Status: DC | PRN
  Filled 2021-11-03: qty 15

## 2021-11-03 MED ORDER — KETOROLAC TROMETHAMINE 30 MG/ML IJ SOLN
30.0000 mg | Freq: Four times a day (QID) | INTRAMUSCULAR | Status: AC
  Administered 2021-11-03 – 2021-11-04 (×2): 30 mg via INTRAVENOUS
  Filled 2021-11-03 (×2): qty 1

## 2021-11-03 MED ORDER — ONDANSETRON HCL 4 MG PO TABS: 4.0000 mg | ORAL_TABLET | Freq: Four times a day (QID) | ORAL | Status: DC | PRN

## 2021-11-03 MED ORDER — KATE FARMS STANDARD 1.4 EN LIQD
6.0000 | ENTERAL | Status: DC
Start: 2021-11-03 — End: 2021-11-03

## 2021-11-03 MED ORDER — ONDANSETRON HCL 4 MG/2ML IJ SOLN
4.0000 mg | Freq: Four times a day (QID) | INTRAMUSCULAR | Status: DC | PRN
  Administered 2021-11-04: 4 mg via INTRAVENOUS
  Filled 2021-11-03: qty 2

## 2021-11-03 MED ORDER — LIDOCAINE VISCOUS HCL 2 % MT SOLN
15.0000 mL | Freq: Once | OROMUCOSAL | Status: AC
  Administered 2021-11-03: 15 mL via OROMUCOSAL
  Filled 2021-11-03: qty 15

## 2021-11-03 MED ORDER — OXYCODONE HCL 5 MG PO TABS
5.0000 mg | ORAL_TABLET | ORAL | Status: DC | PRN
  Administered 2021-11-03 – 2021-11-04 (×6): 5 mg
  Filled 2021-11-03 (×6): qty 1

## 2021-11-03 MED ORDER — POTASSIUM CHLORIDE IN NACL 20-0.9 MEQ/L-% IV SOLN
INTRAVENOUS | Status: AC
  Filled 2021-11-03 (×2): qty 1000

## 2021-11-03 MED ORDER — DIAZEPAM 5 MG/ML IJ SOLN
2.5000 mg | Freq: Once | INTRAMUSCULAR | Status: AC
  Administered 2021-11-03: 2.5 mg via INTRAVENOUS
  Filled 2021-11-03: qty 2

## 2021-11-03 MED ORDER — PANTOPRAZOLE 2 MG/ML SUSPENSION
40.0000 mg | Freq: Once | ORAL | Status: AC
Start: 2021-11-03 — End: 2021-11-03
  Administered 2021-11-03: 40 mg
  Filled 2021-11-03: qty 20

## 2021-11-03 MED ORDER — KETOROLAC TROMETHAMINE 15 MG/ML IJ SOLN
15.0000 mg | Freq: Once | INTRAMUSCULAR | Status: AC
Start: 2021-11-03 — End: 2021-11-03
  Administered 2021-11-03: 15 mg via INTRAVENOUS
  Filled 2021-11-03: qty 1

## 2021-11-03 MED ORDER — OMEPRAZOLE 2 MG/ML ORAL SUSPENSION
20.0000 mg | Freq: Once | ORAL | Status: DC
Start: 2021-11-03 — End: 2021-11-03

## 2021-11-03 MED ORDER — HYDROMORPHONE HCL 1 MG/ML IJ SOLN
1.0000 mg | INTRAMUSCULAR | Status: DC | PRN
  Administered 2021-11-03 (×2): 1 mg via INTRAVENOUS
  Filled 2021-11-03 (×2): qty 1

## 2021-11-03 MED ORDER — ACETAMINOPHEN 160 MG/5ML PO SOLN
650.0000 mg | Freq: Once | ORAL | Status: AC
  Administered 2021-11-03: 650 mg
  Filled 2021-11-03: qty 20.3

## 2021-11-03 MED ORDER — KATE FARMS STANDARD 1.4 PO LIQD
487.5000 mL | Freq: Four times a day (QID) | ORAL | Status: DC
Start: 2021-11-03 — End: 2021-11-10
  Administered 2021-11-03 – 2021-11-09 (×22): 487.5 mL via ORAL
  Administered 2021-11-09: 325 mL via ORAL
  Administered 2021-11-09 – 2021-11-10 (×3): 487.5 mL via ORAL
  Filled 2021-11-03 (×29): qty 650

## 2021-11-03 MED ORDER — SERTRALINE HCL 100 MG PO TABS
100.0000 mg | ORAL_TABLET | Freq: Every morning | ORAL | Status: DC
  Administered 2021-11-04 – 2021-11-10 (×7): 100 mg
  Filled 2021-11-03 (×7): qty 1

## 2021-11-03 MED ORDER — HYDROMORPHONE HCL 1 MG/ML IJ SOLN
1.0000 mg | INTRAMUSCULAR | Status: DC | PRN
  Administered 2021-11-03 – 2021-11-04 (×6): 1 mg via INTRAVENOUS
  Filled 2021-11-03 (×6): qty 1

## 2021-11-03 MED ORDER — HYDROMORPHONE HCL 1 MG/ML IJ SOLN
1.0000 mg | Freq: Once | INTRAMUSCULAR | Status: AC
  Administered 2021-11-03: 1 mg via INTRAVENOUS
  Filled 2021-11-03: qty 1

## 2021-11-03 MED ORDER — GUAIFENESIN 100 MG/5ML PO LIQD
5.0000 mL | ORAL | Status: DC | PRN
  Administered 2021-11-03: 5 mL via ORAL
  Filled 2021-11-03: qty 10

## 2021-11-03 MED ORDER — SODIUM CHLORIDE 0.9 % IV BOLUS
1000.0000 mL | Freq: Once | INTRAVENOUS | Status: AC
  Administered 2021-11-03: 1000 mL via INTRAVENOUS

## 2021-11-03 NOTE — ED Provider Notes (Addendum)
[place] DEPT Provider Note   CSN: [id number] Arrival date & time: 11/03/21  1696     History  Chief Complaint  Patient presents with   Throat Pain    Johnny Mata is a 46 y.o. male.  46 yO M with a chief complaints of a sore throat.  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  He has had terrible pain with this has gotten to the point where he is required multiple IV doses of pain medications usually in the oncology clinic prior to radiation therapy.  He tried different medications at home all without significant improvement of his pain.  There is a shortage of viscous lidocaine and so he has been unable to obtain that as an outpatient.  He does not feel like Tylenol or ibuprofen has been working either.  No fevers no significant change in his throat pain per him.        Home Medications Prior to Admission medications   Medication Sig Start Date End Date Taking? Authorizing Provider  atropine 0.08 mg/mL SOLN Take 1 mL (0.08 mg total) by mouth 3 (three) times daily. For excessive secretions. 10/28/21   Dede Query T, PA-C  dexamethasone (DECADRON) 4 MG tablet Take 2 tablets (8 mg total) by mouth daily. Take daily x 3 days starting the day after cisplatin chemotherapy. Take with food. 09/09/21   Benay Pike, MD  diphenhydrAMINE (BENADRYL) 25 mg capsule Take 1 capsule (25 mg total) by mouth daily as needed for sleep. 08/20/21   Boyce Medici., MD  fentaNYL (DURAGESIC) 12 MCG/HR Place 1 patch onto the skin every 3 (three) days. 10/28/21   Lincoln Brigham, PA-C  HYDROcodone-acetaminophen (NORCO/VICODIN) 5-325 MG tablet Take 1-2 tablets by mouth every 4 (four) hours as needed for moderate pain. 10/31/21   Pickenpack-Cousar, Carlena Sax, NP  hyoscyamine (ANASPAZ) 0.125 MG TBDP disintergrating tablet Place 1 tablet (0.125 mg total) under the tongue every 6 (six) hours as needed (secretions).  10/30/21   Pickenpack-Cousar, Carlena Sax, NP  ibuprofen (ADVIL) 200 MG tablet Take 400 mg by mouth See admin instructions. Pt states they are taking 400 mg every 2-3 hours as needed.    [provider]  Ibuprofen-diphenhydrAMINE Cit (IBUPROFEN PM PO) Take 2 tablets by mouth at bedtime.    [provider]  lidocaine (XYLOCAINE) 2 % solution Patient: Mix 1part 2% viscous lidocaine, 1part H20. Swish & swallow 37m of diluted mixture, 361m before meals and at bedtime, up to four times daily 10/28/21   ThDede Query, PA-C  lidocaine-prilocaine (EMLA) cream Apply to affected area once 09/09/21   IrBenay PikeMD  Nutritional Supplements (KATE FARMS STANDARD 1.4) LIQD 6 cartons via tube split over four feedings. Flush tube with 90 ml water before and after feedings. Drink by mouth or give via tube additional 2 cups water. This provides 2730 kcal, 120 grams protein, 1404 ml free water (2598 ml total water with flushes). Meets 100% needs  10/01/21   IrBenay PikeMD  omeprazole (FIRST-OMEPRAZOLE) 2 mg/mL SUSP oral suspension Place 10 mLs (20 mg total) into feeding tube daily. To prevent heartburn. 10/20/21   SqEppie GibsonMD  Omeprazole-Sodium Bicarbonate (KONVOMEP) 2-84 MG/ML SUSR Take 10 mls by feeding tube once daily to prevent heartburn 10/24/21   SqEppie GibsonMD  ondansetron (ZOFRAN-ODT) 8 MG disintegrating tablet Take 1 tablet (8 mg total) by mouth every 8 (eight) hours as needed for nausea  or vomiting (Take around the clock prophylactically if nausea is severe.). 10/20/21   Eppie Gibson, MD  oxyCODONE-acetaminophen (PERCOCET) 10-325 MG tablet Take 1 tablet by mouth every 4 (four) hours as needed. 08/26/21   [provider]  prochlorperazine (COMPAZINE) 10 MG tablet Take 1 tablet (10 mg total) by mouth every 6 (six) hours as needed (Nausea or vomiting). 10/28/21   Lincoln Brigham, PA-C  scopolamine (TRANSDERM-SCOP) 1 MG/3DAYS Place 1 patch (1.5 mg total) onto the skin every 3  (three) days. To help with thick secretions. 10/20/21   Eppie Gibson, MD  sertraline (ZOLOFT) 100 MG tablet Take 100 mg by mouth daily. 05/20/21   [provider]  sucralfate (CARAFATE) 1 g tablet Dissolve 1 tablet in 10 mL H20 and swallow 10-20 min prior to meals and bedtime up to QID PRN soreness. 10/06/21   Eppie Gibson, MD  traZODone (DESYREL) 50 MG tablet Take 50 mg by mouth at bedtime. Able to take 50-'200mg'$  as needed for sleep    [provider]      Allergies    Dust mite extract, Other, and Pollen extract-tree extract [pollen extract]    Review of Systems   Review of Systems  Physical Exam Updated Vital Signs BP 121/82   Pulse 95   Temp 98.9 F (37.2 C) (Oral)   Resp 18   SpO2 100%  Physical Exam Vitals and nursing note reviewed.  Constitutional:      Appearance: He is well-developed.  HENT:     Head: Normocephalic and atraumatic.  Eyes:     Pupils: Pupils are equal, round, and reactive to light.  Neck:     Vascular: No JVD.  Cardiovascular:     Rate and Rhythm: Normal rate and regular rhythm.     Heart sounds: No murmur heard.    No friction rub. No gallop.  Pulmonary:     Effort: No respiratory distress.     Breath sounds: No wheezing.  Abdominal:     General: There is no distension.     Tenderness: There is no abdominal tenderness. There is no guarding or rebound.  Musculoskeletal:        General: Normal range of motion.     Cervical back: Normal range of motion and neck supple.  Skin:    Coloration: Skin is not pale.     Findings: No rash.  Neurological:     Mental Status: He is alert and oriented to person, place, and time.  Psychiatric:        Behavior: Behavior normal.     ED Results / Procedures / Treatments   Labs (all labs ordered are listed, but only abnormal results are displayed) Labs Reviewed  CBC WITH DIFFERENTIAL/PLATELET - Abnormal; Notable for the following components:      Result Value   WBC 2.5 (*)    RBC 2.65 (*)     Hemoglobin 8.6 (*)    HCT 24.8 (*)    Neutro Abs 1.5 (*)    Lymphs Abs 0.2 (*)    All other components within normal limits  COMPREHENSIVE METABOLIC PANEL - Abnormal; Notable for the following components:   Calcium 8.3 (*)    Total Protein 6.2 (*)    Albumin 3.4 (*)    AST 12 (*)    All other components within normal limits    EKG None  Radiology No results found.  Procedures Procedures    Medications Ordered in ED Medications  0.9 % NaCl with  KCl 20 mEq/ L  infusion (0 mL/hr Intravenous Stopped 11/03/21 1251)  HYDROmorphone (DILAUDID) injection 1 mg (1 mg Intravenous Given 11/03/21 1007)  sodium chloride 0.9 % bolus 1,000 mL (0 mLs Intravenous Stopped 11/03/21 1251)  HYDROmorphone (DILAUDID) injection 1 mg (1 mg Intravenous Given 11/03/21 0759)  ketorolac (TORADOL) 15 MG/ML injection 15 mg (15 mg Intravenous Given 11/03/21 0801)  acetaminophen (TYLENOL) 160 MG/5ML solution 650 mg (650 mg Per Tube Given 11/03/21 0801)  diazepam (VALIUM) injection 2.5 mg (2.5 mg Intravenous Given 11/03/21 0809)  lidocaine (XYLOCAINE) 2 % viscous mouth solution 15 mL (15 mLs Mouth/Throat Given 11/03/21 4287)    ED Course/ Medical Decision Making/ A&P                           Medical Decision Making Amount and/or Complexity of Data Reviewed Labs: ordered. ECG/medicine tests: ordered.  Risk OTC drugs. Prescription drug management. Decision regarding hospitalization.   46 yo M with a chief complaint of a sore throat.  This has been going on for a couple months now.  He was diagnosed with squamous cell carcinoma of the throat.  Has been undergoing radiation and chemotherapy.  He has seen his oncologist multiple times this past week.  Has required multiple IV doses of narcotics.  He has had ongoing intractable pain.  Has not been able to function well at home.  We will attempt to control his pain here.  Patient is feeling a bit better on reassessment.  His hemoglobin is similar to last check  at 8.6.  Mild leukopenia platelets are normal.  No significant electrolyte abnormality no change in renal function.  Patient still complaining of some severe discomfort.  Will discuss with oncology.  I discussed the case with Dr. Lindi Adie, oncology he did feel it was reasonable to bring the patient in for acute pain management.  Will discuss with medicine for admission.  CRITICAL CARE Performed by: Cecilio Asper   Total critical care time: 35 minutes  Critical care time was exclusive of separately billable procedures and treating other patients.  Critical care was necessary to treat or prevent imminent or life-threatening deterioration.  Critical care was time spent personally by me on the following activities: development of treatment plan with patient and/or surrogate as well as nursing, discussions with consultants, evaluation of patient's response to treatment, examination of patient, obtaining history from patient or surrogate, ordering and performing treatments and interventions, ordering and review of laboratory studies, ordering and review of radiographic studies, pulse oximetry and re-evaluation of patient's condition.   The patients results and plan were reviewed and discussed.   Any x-rays performed were independently reviewed by myself.   Differential diagnosis were considered with the presenting HPI.  Medications  0.9 % NaCl with KCl 20 mEq/ L  infusion (0 mL/hr Intravenous Stopped 11/03/21 1251)  HYDROmorphone (DILAUDID) injection 1 mg (1 mg Intravenous Given 11/03/21 1007)  sodium chloride 0.9 % bolus 1,000 mL (0 mLs Intravenous Stopped 11/03/21 1251)  HYDROmorphone (DILAUDID) injection 1 mg (1 mg Intravenous Given 11/03/21 0759)  ketorolac (TORADOL) 15 MG/ML injection 15 mg (15 mg Intravenous Given 11/03/21 0801)  acetaminophen (TYLENOL) 160 MG/5ML solution 650 mg (650 mg Per Tube Given 11/03/21 0801)  diazepam (VALIUM) injection 2.5 mg (2.5 mg Intravenous Given  11/03/21 0809)  lidocaine (XYLOCAINE) 2 % viscous mouth solution 15 mL (15 mLs Mouth/Throat Given 11/03/21 0806)    Vitals:   11/03/21 1100 11/03/21 1115  11/03/21 1130 11/03/21 1200  BP: 133/88 (!) 132/95 129/77 121/82  Pulse: 97 (!) 108 (!) 104 95  Resp: (!) '22 20  18  '$ Temp:      TempSrc:      SpO2: 97% 95% 96% 100%    Final diagnoses:  Throat cancer [facility])    Admission/ observation were discussed with the admitting physician, patient and/or family and they are comfortable with the plan.           Final Clinical Impression(s) / ED Diagnoses Final diagnoses:  Throat cancer [facility])    Rx / DC Orders ED Discharge Orders     None         Deno Etienne, DO 11/03/21 [place], [place], DO 11/18/21 1502

## 2021-11-03 NOTE — ED Notes (Signed)
Pt fed himself a 325 mg katefarms standard sole-source nutrition formula 1.4 cal/ml plain flavor. Pt brought this item from home. The same supplement is ordered in vanilla flavor. Again, I reminded pt he should only consume items provided by the hospital.

## 2021-11-03 NOTE — ED Notes (Addendum)
Pt said, "I've got my Vicodin here and I'm going to take some." I advised pt to not take Vicodin or any medication from home. I explained the risks associated with additional opioid administration. Pt said, "Is this the last of it" and pointed to the fentyl patch I just applied to LRQ. Pt reporting 10/10 pain and IV dilaudid 1 mg "lasts 45 minutes."

## 2021-11-03 NOTE — ED Triage Notes (Signed)
Patient has stage 4 throat cancer. Diagnosed in April. Getting palliative care treatment, finished chemo, now has 4 more radiation treatments. Having terrible pain in his throat, tongue, nose. Unable to swallow at baseline.

## 2021-11-03 NOTE — Progress Notes (Signed)
When doing nursing admission history, pt states he did not bring any medications but someone will be bringing them to him. I told him that the nurse will provide him with the medications that his doctor orders and he should not have any home medications here. Pt states "They are going to bring them. You all won't give them to me. " Pt's rn informed. Doroteo Bradford BSN, RN-BC Throughput Nurse 11/03/2021 4:18 PM

## 2021-11-03 NOTE — ED Notes (Signed)
Pt reporting 10/10 pain, says pain medication lasts 45 min, and requests additional pain medication.

## 2021-11-03 NOTE — H&P (Signed)
History and Physical    Patient: Johnny Mata ZGY:[id number] DOB: 18-Feb-1976 DOA: 11/03/2021 DOS: the patient was seen and examined on 11/03/2021 PCP: Patient, No Pcp Per  Patient coming from: Home  Chief Complaint:  Chief Complaint  Patient presents with   Throat Pain   HPI: Johnny Mata is a 46 y.o. male with medical history significant of anxiety, asthma, depression, alcohol abuse, normocytic anemia, primary cancer of the tonsillar.,  Protein calorie malnutrition who is coming to the emergency department due to intractable throat cancer pain that is not responding to his usual home regimen. He denied fever, chills, rhinorrhea, wheezing or hemoptysis.  No chest pain, palpitations, diaphoresis, PND, orthopnea or pitting edema of the lower extremities.  No abdominal pain, nausea, emesis, diarrhea, constipation, melena or hematochezia.  No flank pain, dysuria, frequency or hematuria.  No polyuria, polydipsia, polyphagia or blurred vision.   ED course: Initial vital signs were temperature 98.9 F, pulse 112, respiration 20, BP 136/87 mmHg O2 sat 95% on room air.  Patient was given at 1000 mL NS bolus, ketorolac 30 mg IVP, hydromorphone 1 mg IVP, acetaminophen 650 mg p.o. and diazepam 2.5 mg IVP.  Lab work: CBC showed a white count 2.5, hemoglobin 8.6 g/dL platelets 204.  CMP with a calcium level of 8.3 mg/dL, total protein 6.2 and albumin 3.4 g/dL.  The rest of the CMP is unremarkable.   Review of Systems: As mentioned in the history of present illness. All other systems reviewed and are negative.  Past Medical History:  Diagnosis Date   Anxiety    Never officially diagnosed; does not take medication for anxiety   Asthma    Hasn't had an episode since childhood   Cancer [facility])    Depression    Past Surgical History:  Procedure Laterality Date   ELBOW SURGERY Bilateral    HIP SURGERY Left    IR GASTROSTOMY TUBE MOD SED  09/11/2021   IR IMAGING GUIDED PORT INSERTION  09/11/2021   NECK  SURGERY  05/09/2018   Cervical fusion (Dr. Atilano Ina)   PANENDOSCOPY N/A 08/19/2021   Procedure: PANENDOSCOPY WITH BIPOSY--direct laryngoscopy, flexible bronchoscopy, rigid esophagoscopy, biopsy of lesion in throat;  Surgeon: Marcina Millard, MD;  Location: [place];  Service: ENT;  Laterality: N/A;   PLANTAR FASCIA SURGERY     SHOULDER SURGERY Left    TONSILLECTOMY     WISDOM TOOTH EXTRACTION Bilateral 1995   Social History:  reports that he quit smoking about 2 years ago. His smoking use included cigarettes. He has a 30.00 pack-year smoking history. He has never used smokeless tobacco. He reports that he does not currently use alcohol after a past usage of about 7.0 standard drinks of alcohol per week. He reports current drug use. Drug: Marijuana.  Allergies  Allergen Reactions   Dust Mite Extract Shortness Of Breath and Other (See Comments)    Wheezing   Other Shortness Of Breath and Other (See Comments)    Animal Dander. Wheezing Other reaction(s): Wheezing   Pollen Extract-Tree Extract [Pollen Extract] Shortness Of Breath and Cough    Eye redness, wheezing, runny nose    Family History  Problem Relation Age of Onset   Colon cancer Mother     Prior to Admission medications   Medication Sig Start Date End Date Taking? Authorizing Provider  HYDROcodone-acetaminophen (NORCO/VICODIN) 5-325 MG tablet Take 1-2 tablets by mouth every 4 (four) hours as needed for moderate pain. Patient taking differently: Place 2 tablets  into feeding tube every 4 (four) hours as needed (pain). 10/31/21  Yes Pickenpack-Cousar, Carlena Sax, NP  Nutritional Supplements (KATE FARMS STANDARD 1.4) LIQD 6 cartons via tube split over four feedings. Flush tube with 90 ml water before and after feedings. Drink by mouth or give via tube additional 2 cups water. This provides 2730 kcal, 120 grams protein, 1404 ml free water (2598 ml total water with flushes). Meets 100% needs  Patient taking differently: by Per J  Tube route See admin instructions. 6 cartons via tube split over four feedings. Flush tube with 90 ml water before and after feedings. Drink by mouth or give via tube additional 2 cups water. This provides 2730 kcal, 120 grams protein, 1404 ml free water (2598 ml total water with flushes). Meets 100% needs  10/01/21  Yes Iruku, Arletha Pili, MD  ondansetron (ZOFRAN-ODT) 8 MG disintegrating tablet Take 1 tablet (8 mg total) by mouth every 8 (eight) hours as needed for nausea or vomiting (Take around the clock prophylactically if nausea is severe.). 10/20/21  Yes Eppie Gibson, MD  prochlorperazine (COMPAZINE) 10 MG tablet Take 1 tablet (10 mg total) by mouth every 6 (six) hours as needed (Nausea or vomiting). Patient taking differently: Place 10 mg into feeding tube every 4 (four) hours as needed for nausea or vomiting (with each dose of hydrocodone/apap). 10/28/21  Yes Dede Query T, PA-C  sertraline (ZOLOFT) 100 MG tablet Place 100 mg into feeding tube every morning. 05/20/21  Yes [provider]  atropine 0.08 mg/mL SOLN Take 1 mL (0.08 mg total) by mouth 3 (three) times daily. For excessive secretions. Patient not taking: Reported on 11/03/2021 10/28/21   Dede Query T, PA-C  dexamethasone (DECADRON) 4 MG tablet Take 2 tablets (8 mg total) by mouth daily. Take daily x 3 days starting the day after cisplatin chemotherapy. Take with food. Patient not taking: Reported on 11/03/2021 09/09/21   Benay Pike, MD  diphenhydrAMINE (BENADRYL) 25 mg capsule Take 1 capsule (25 mg total) by mouth daily as needed for sleep. Patient not taking: Reported on 11/03/2021 08/20/21   Boyce Medici., MD  fentaNYL (DURAGESIC) 12 MCG/HR Place 1 patch onto the skin every 3 (three) days. Patient not taking: Reported on 11/03/2021 10/28/21   Lincoln Brigham, PA-C  hyoscyamine (ANASPAZ) 0.125 MG TBDP disintergrating tablet Place 1 tablet (0.125 mg total) under the tongue every 6 (six) hours as needed  (secretions). Patient not taking: Reported on 11/03/2021 10/30/21   Pickenpack-Cousar, Carlena Sax, NP  lidocaine (XYLOCAINE) 2 % solution Patient: Mix 1part 2% viscous lidocaine, 1part H20. Swish & swallow 69m of diluted mixture, 366m before meals and at bedtime, up to four times daily Patient not taking: Reported on 11/03/2021 10/28/21   ThDede Query, PA-C  lidocaine-prilocaine (EMLA) cream Apply to affected area once Patient not taking: Reported on 11/03/2021 09/09/21   IrBenay PikeMD  omeprazole (FIRST-OMEPRAZOLE) 2 mg/mL SUSP oral suspension Place 10 mLs (20 mg total) into feeding tube daily. To prevent heartburn. Patient not taking: Reported on 11/03/2021 10/20/21   SqEppie GibsonMD  Omeprazole-Sodium Bicarbonate ([facility]2-84 MG/ML SUSR Take 10 mls by feeding tube once daily to prevent heartburn Patient not taking: Reported on 11/03/2021 10/24/21   SqEppie GibsonMD  scopolamine (TRANSDERM-SCOP) 1 MG/3DAYS Place 1 patch (1.5 mg total) onto the skin every 3 (three) days. To help with thick secretions. Patient not taking: Reported on 11/03/2021 10/20/21   SqEppie GibsonMD  sucralfate (CARAFATE) 1 g tablet  Dissolve 1 tablet in 10 mL H20 and swallow 10-20 min prior to meals and bedtime up to QID PRN soreness. Patient not taking: Reported on 11/03/2021 10/06/21   Eppie Gibson, MD    Physical Exam: Vitals:   11/03/21 1230 11/03/21 1315 11/03/21 1330 11/03/21 1345  BP:  (!) 139/94 (!) 141/88 140/89  Pulse: (!) 114 (!) 118 99 (!) 104  Resp:    18  Temp:      TempSrc:      SpO2: 95% 100% 100% 100%   Physical Exam Vitals and nursing note reviewed.  Constitutional:      General: He is awake.     Appearance: Normal appearance. He is ill-appearing.  HENT:     Head: Normocephalic.     Mouth/Throat:     Mouth: Mucous membranes are moist.  Eyes:     General: No scleral icterus.    Pupils: Pupils are equal, round, and reactive to light.  Cardiovascular:     Rate and Rhythm: Normal rate and  regular rhythm.  Pulmonary:     Effort: Pulmonary effort is normal.     Breath sounds: Normal breath sounds.  Abdominal:     General: Bowel sounds are normal. There is no distension.     Palpations: Abdomen is soft.     Tenderness: There is no abdominal tenderness. There is no guarding or rebound.  Musculoskeletal:     Cervical back: Neck supple.     Right lower leg: No edema.     Left lower leg: No edema.  Skin:    General: Skin is warm and dry.  Neurological:     General: No focal deficit present.     Mental Status: He is alert and oriented to person, place, and time.  Psychiatric:        Mood and Affect: Mood normal.        Behavior: Behavior normal. Behavior is cooperative.     Data Reviewed:  There are no new results to review at this time.  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 Consider nutritional services evaluation.    Alcohol abuse Not actively drinking.    Leukopenia Monitor WBC count.    Normocytic anemia Monitor H&H.    Advance Care Planning:   Code Status: Full Code   Consults:   Family Communication:   Severity of Illness: The appropriate patient status for this patient is OBSERVATION. Observation status is judged to be reasonable and necessary in order to provide the required intensity of service to ensure the patient's safety. The patient's presenting symptoms, physical exam findings, and initial radiographic and laboratory data in the context of their medical condition is felt to place them at decreased risk for further clinical deterioration. Furthermore, it is anticipated that the patient will be medically stable for discharge from the hospital within 2 midnights of  admission.   Author: Reubin Milan, MD 11/03/2021 3:20 PM  For on call review [email or web address].   This document was prepared using Dragon voice recognition software and may contain some unintended transcription errors.

## 2021-11-04 ENCOUNTER — Inpatient Hospital Stay: Payer: Commercial Managed Care - PPO

## 2021-11-04 ENCOUNTER — Other Ambulatory Visit: Payer: Self-pay

## 2021-11-04 ENCOUNTER — Ambulatory Visit: Payer: Commercial Managed Care - PPO

## 2021-11-04 ENCOUNTER — Ambulatory Visit
Admission: RE | Admit: 2021-11-04 | Discharge: 2021-11-04 | Disposition: A | Discharge status: Home / Self Care | Payer: Commercial Managed Care - PPO | Source: Ambulatory Visit | Attending: Radiation Oncology | Admitting: Radiation Oncology

## 2021-11-04 DIAGNOSIS — F1010 Alcohol abuse, uncomplicated: Secondary | ICD-10-CM | POA: Diagnosis present

## 2021-11-04 DIAGNOSIS — E440 Moderate protein-calorie malnutrition: Secondary | ICD-10-CM | POA: Diagnosis present

## 2021-11-04 DIAGNOSIS — D6481 Anemia due to antineoplastic chemotherapy: Secondary | ICD-10-CM | POA: Diagnosis present

## 2021-11-04 DIAGNOSIS — F411 Generalized anxiety disorder: Secondary | ICD-10-CM | POA: Diagnosis not present

## 2021-11-04 DIAGNOSIS — Z91199 Patient's noncompliance with other medical treatment and regimen due to unspecified reason: Secondary | ICD-10-CM | POA: Diagnosis not present

## 2021-11-04 DIAGNOSIS — G893 Neoplasm related pain (acute) (chronic): Secondary | ICD-10-CM | POA: Diagnosis present

## 2021-11-04 DIAGNOSIS — C090 Malignant neoplasm of tonsillar fossa: Secondary | ICD-10-CM | POA: Diagnosis present

## 2021-11-04 DIAGNOSIS — T451X5A Adverse effect of antineoplastic and immunosuppressive drugs, initial encounter: Secondary | ICD-10-CM | POA: Diagnosis present

## 2021-11-04 DIAGNOSIS — R509 Fever, unspecified: Secondary | ICD-10-CM | POA: Diagnosis not present

## 2021-11-04 DIAGNOSIS — Z981 Arthrodesis status: Secondary | ICD-10-CM | POA: Diagnosis not present

## 2021-11-04 DIAGNOSIS — R5081 Fever presenting with conditions classified elsewhere: Secondary | ICD-10-CM | POA: Diagnosis not present

## 2021-11-04 DIAGNOSIS — Z87891 Personal history of nicotine dependence: Secondary | ICD-10-CM | POA: Diagnosis not present

## 2021-11-04 DIAGNOSIS — D630 Anemia in neoplastic disease: Secondary | ICD-10-CM | POA: Diagnosis present

## 2021-11-04 DIAGNOSIS — Z800 Family history of malignant neoplasm of digestive organs: Secondary | ICD-10-CM | POA: Diagnosis not present

## 2021-11-04 DIAGNOSIS — K5903 Drug induced constipation: Secondary | ICD-10-CM | POA: Diagnosis not present

## 2021-11-04 DIAGNOSIS — R634 Abnormal weight loss: Secondary | ICD-10-CM | POA: Diagnosis present

## 2021-11-04 DIAGNOSIS — F32A Depression, unspecified: Secondary | ICD-10-CM | POA: Diagnosis present

## 2021-11-04 DIAGNOSIS — D701 Agranulocytosis secondary to cancer chemotherapy: Secondary | ICD-10-CM | POA: Diagnosis present

## 2021-11-04 DIAGNOSIS — T402X5A Adverse effect of other opioids, initial encounter: Secondary | ICD-10-CM | POA: Diagnosis not present

## 2021-11-04 DIAGNOSIS — C113 Malignant neoplasm of anterior wall of nasopharynx: Secondary | ICD-10-CM | POA: Diagnosis present

## 2021-11-04 DIAGNOSIS — K1231 Oral mucositis (ulcerative) due to antineoplastic therapy: Secondary | ICD-10-CM | POA: Diagnosis present

## 2021-11-04 DIAGNOSIS — Z6821 Body mass index (BMI) 21.0-21.9, adult: Secondary | ICD-10-CM | POA: Diagnosis not present

## 2021-11-04 DIAGNOSIS — Z91048 Other nonmedicinal substance allergy status: Secondary | ICD-10-CM | POA: Diagnosis not present

## 2021-11-04 DIAGNOSIS — R52 Pain, unspecified: Secondary | ICD-10-CM | POA: Diagnosis not present

## 2021-11-04 DIAGNOSIS — C140 Malignant neoplasm of pharynx, unspecified: Secondary | ICD-10-CM | POA: Diagnosis present

## 2021-11-04 DIAGNOSIS — Z515 Encounter for palliative care: Secondary | ICD-10-CM | POA: Diagnosis not present

## 2021-11-04 LAB — RAD ONC ARIA SESSION SUMMARY
Course Elapsed Days: 48
Plan Fractions Treated to Date: 32
Plan Prescribed Dose Per Fraction: 2 Gy
Plan Total Fractions Prescribed: 35
Plan Total Prescribed Dose: 70 Gy
Reference Point Dosage Given to Date: 64 Gy
Reference Point Session Dosage Given: 2 Gy
Session Number: 32

## 2021-11-04 LAB — COMPREHENSIVE METABOLIC PANEL
ALT: 14 U/L (ref 0–44)
AST: 13 U/L — ABNORMAL LOW (ref 15–41)
Albumin: 3.1 g/dL — ABNORMAL LOW (ref 3.5–5.0)
Alkaline Phosphatase: 44 U/L (ref 38–126)
Anion gap: 6 (ref 5–15)
BUN: 12 mg/dL (ref 6–20)
CO2: 27 mmol/L (ref 22–32)
Calcium: 8.5 mg/dL — ABNORMAL LOW (ref 8.9–10.3)
Chloride: 104 mmol/L (ref 98–111)
Creatinine, Ser: 0.53 mg/dL — ABNORMAL LOW (ref 0.61–1.24)
GFR, Estimated: >60 mL/min (ref >60)
Glucose, Bld: 102 mg/dL — ABNORMAL HIGH (ref 70–99)
Potassium: 4.7 mmol/L (ref 3.5–5.1)
Sodium: 137 mmol/L (ref 135–145)
Total Bilirubin: 0.3 mg/dL (ref 0.3–1.2)
Total Protein: 6.2 g/dL — ABNORMAL LOW (ref 6.5–8.1)

## 2021-11-04 LAB — CBC
HCT: 22.8 % — ABNORMAL LOW (ref 39.0–52.0)
Hemoglobin: 7.8 g/dL — ABNORMAL LOW (ref 13.0–17.0)
MCH: 32.6 pg (ref 26.0–34.0)
MCHC: 34.2 g/dL (ref 30.0–36.0)
MCV: 95.4 fL (ref 80.0–100.0)
Platelets: 174 10*3/uL (ref 150–400)
RBC: 2.39 MIL/uL — ABNORMAL LOW (ref 4.22–5.81)
RDW: 15.3 % (ref 11.5–15.5)
WBC: 2.8 10*3/uL — ABNORMAL LOW (ref 4.0–10.5)
nRBC: 0 % (ref 0.0–0.2)

## 2021-11-04 MED ORDER — HYDROMORPHONE HCL 1 MG/ML IJ SOLN
1.0000 mg | INTRAMUSCULAR | Status: DC | PRN
  Administered 2021-11-04 – 2021-11-08 (×26): 1 mg via INTRAVENOUS
  Filled 2021-11-04 (×27): qty 1

## 2021-11-04 MED ORDER — HYDROMORPHONE HCL 1 MG/ML IJ SOLN
1.0000 mg | Freq: Once | INTRAMUSCULAR | Status: AC
  Administered 2021-11-04: 1 mg via INTRAVENOUS
  Filled 2021-11-04: qty 1

## 2021-11-04 MED ORDER — HYDROMORPHONE HCL 1 MG/ML IJ SOLN
INTRAMUSCULAR | Status: AC
  Filled 2021-11-04: qty 1

## 2021-11-04 MED ORDER — CHLORHEXIDINE GLUCONATE CLOTH 2 % EX PADS
6.0000 | MEDICATED_PAD | Freq: Every day | CUTANEOUS | Status: DC
  Administered 2021-11-04 – 2021-11-10 (×7): 6 via TOPICAL

## 2021-11-04 MED ORDER — OXYCODONE HCL 5 MG/5ML PO SOLN
10.0000 mg | ORAL | Status: DC | PRN
  Administered 2021-11-04 – 2021-11-05 (×5): 10 mg
  Filled 2021-11-04 (×5): qty 10

## 2021-11-04 NOTE — Progress Notes (Cosign Needed)
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  He completed his seventh cycle of cisplatin on 10/29/2021.  He continues on radiation with 4 more treatments left.  He will continue radiation under the care of of Dr. Isidore Moos.  He has ongoing sore throat pain from chemoradiation.  He has been seen by palliative care who has been adjusting his pain medication.  Last palliative care note has been reviewed.  Recommend continuation of fentanyl patch.  The patient was taking 10 mg oxycodone as needed at home.  However, only 5 mg have been ordered here.  I have increased his oxycodone to 10 mg every 3 hours as needed for pain.  He reports that Dilaudid works well for him but is not lasting a full 3 hours.  I have adjusted his Dilaudid to 1 mg every 2 hours as needed for severe pain.  I have placed a palliative care consult and have updated the palliative care NP at the cancer center to please follow-up for further adjustments.  He continues to have mild leukopenia from recent chemotherapy.  White blood cell count slowly increasing and anticipate continued improvement since he is not receiving any additional chemotherapy.  He is anemic with a hemoglobin of 7.8.  Recommend close monitoring and transfuse for hemoglobin less than 7.   Cancer related pain Plan as noted above   Mucositis due to antineoplastic therapy Severe mucositis from ongoing chemoradiation   Weight loss, unintentional He has had weight loss during his treatment.  The cancer center dietitian has been following him.  He is on a feeding tube.  However, this has not been ordered in the hospital.  I have placed a dietitian consult to get his tube feedings restarted.   Mikey Bussing  SUBJECTIVE: The patient completed his last cycle of chemotherapy on 6/14.  He continues on radiation and has 4 more treatments left.  Admitted for uncontrolled pain.  Pain is primarily in his throat and face.  He states that pain is not fully controlled at this time.  He remains on a fentanyl patch along with oxycodone via PEG tube.  He is receiving IV Dilaudid as well.  He states that Dilaudid does help control his pain but does not last a full 3 hours.  The patient is supposed to receiving tube feedings but none are running at this time.  Dietitian has not yet been consulted.  Oncology History  Primary cancer of tonsillar fossa ([place])  08/19/2021 Imaging   Patient complained of spitting chunks of tissue hence had a CT maxillofacial with contrast as well as with the neck with contrast.  Mucosal hyperenhancement in the right nasopharynx extending to the oropharynx with mostly soft tissue density in the region of right palatine tonsil most suspicious for malignancy.  There is also a 1.8 x 1.5 x 2.4 cm peripherally enhancing hyperintense lesion in the right retropharynx suspicious for necrotic lymph node with additional prominent right level 2 lymph nodes measuring up to 1.3 cm in short axis with suspected extracapsular extension at level to me.   08/27/2021 Initial Diagnosis   Primary cancer of tonsillar fossa ([place])   09/01/2021 PET scan   PET scan from April 17 showed locally advanced right palatine tonsil primary with ipsilateral cervical nodal metastasis no evidence of hypermetabolic extracervical  metastasis.  Tiny pulmonary nodules below PET resolution consider chest CT follow-up at 6 months   09/03/2021 Cancer Staging   Staging form: Pharynx - P16 Negative Oropharynx, AJCC 8th Edition - Clinical stage from 09/03/2021: Stage IVA (cT3, cN2b, cM0, p16-) - Signed by Eppie Gibson, MD on 09/03/2021 Stage prefix: Initial diagnosis   09/17/2021 -  Chemotherapy   Patient is on Treatment Plan :  HEAD/NECK Cisplatin q7d        REVIEW OF SYSTEMS:   Review of Systems  Constitutional:        Low-grade fever noted  HENT:  Positive for hearing loss.   Respiratory:  Positive for cough and sputum production. Negative for shortness of breath.   Cardiovascular:  Negative for chest pain.  Gastrointestinal:  Negative for nausea and vomiting.  Neurological: Negative.    I have reviewed the past medical history, past surgical history, social history and family history with the patient and they are unchanged from previous note.   PHYSICAL EXAMINATION: ECOG PERFORMANCE STATUS: 1 - Symptomatic but completely ambulatory  Vitals:   11/04/21 0127 11/04/21 0417  BP: (!) 142/97 (!) 136/94  Pulse: (!) 109 (!) 102  Resp: 18 16  Temp: 98.9 F (37.2 C) 99.1 F (37.3 C)  SpO2: 95% 97%   There were no vitals filed for this visit.  Intake/Output from previous day: 06/19 0701 - 06/20 0700 In: 1000 [IV Piggyback:1000] Out: -   Physical Exam Vitals reviewed.  HENT:     Head: Normocephalic.  Eyes:     General: No scleral icterus.    Conjunctiva/sclera: Conjunctivae normal.  Skin:    General: Skin is warm and dry.  Neurological:     Mental Status: He is alert and oriented to person, place, and time.    LABORATORY DATA:  I have reviewed the data as listed    Latest Ref Rng & Units 11/04/2021    5:00 AM 11/03/2021    8:29 AM 10/28/2021    2:26 PM  CMP  Glucose 70 - 99 mg/dL 102  99  139   BUN 6 - 20 mg/dL '12  17  13   '$ Creatinine 0.61 - 1.24 mg/dL 0.53  0.61  0.74   Sodium 135 - 145 mmol/L 137  136  132   Potassium 3.5 - 5.1 mmol/L 4.7  3.5  4.1   Chloride 98 - 111 mmol/L 104  99  94   CO2 22 - 32 mmol/L '27  28  31   '$ Calcium 8.9 - 10.3 mg/dL 8.5  8.3  9.5   Total Protein 6.5 - 8.1 g/dL 6.2  6.2    Total Bilirubin 0.3 - 1.2 mg/dL 0.3  0.4    Alkaline Phos 38 - 126 U/L 44  44    AST 15 - 41 U/L 13  12    ALT 0 - 44 U/L 14  15      Lab Results  Component Value Date   WBC 2.8  (L) 11/04/2021   HGB 7.8 (L) 11/04/2021   HCT 22.8 (L) 11/04/2021   MCV 95.4 11/04/2021   PLT 174 11/04/2021   NEUTROABS 1.5 (L) 11/03/2021    No results found for: "CEA1", "CEA", "PZW258", "CA125", "PSA1"  No results found.   Future Appointments  Date Time Provider [place]  11/04/2021  1:45 PM [facility] LINAC 4 [facility] None  11/04/2021  4:00 PM LINAC-SQUIRE [facility] None  11/05/2021  2:00 PM [facility] INFUSION [facility] None  11/05/2021  3:45 PM [facility] LINAC 3 [facility] None  11/06/2021 11:00 AM [facility]NAG[facility] 2 [facility] None  11/06/2021 11:15 AM Pickenpack-Cousar, Carlena Sax, NP [facility] None  11/06/2021 12:00 PM [facility] LINAC 3 [facility] None  11/06/2021 12:00 PM Morrell Riddle, RD [facility] None  11/07/2021  9:45 AM Benay Pike, MD [facility] None  11/07/2021 11:15 AM [facility] LINAC 3 [facility] None  11/19/2021  9:00 AM Wynelle Beckmann, Blaire L, PT [facility] None  11/19/2021 10:15 AM Sharen Counter, CCC-SLP [facility]  11/21/2021 11:20 AM Eppie Gibson, MD [facility] None      LOS: 0 days

## 2021-11-04 NOTE — Progress Notes (Signed)
Initial Nutrition Assessment  INTERVENTION:   -Continue 487 ml [employer] 1.4 QID via PEG -Flush with 90 ml free water before and after each feeding -Provides 2730 kcals, 120g protein and 2484 ml H2O  NUTRITION DIAGNOSIS:   Increased nutrient needs related to cancer and cancer related treatments as evidenced by estimated needs.  GOAL:   Patient will meet greater than or equal to 90% of their needs  MONITOR:   Labs, Weight trends, TF tolerance, I & O's  REASON FOR ASSESSMENT:   Consult Enteral/tube feeding initiation and management  ASSESSMENT:   46 y.o. male with medical history significant of anxiety, asthma, depression, alcohol abuse, normocytic anemia, primary cancer of the tonsillar protein calorie malnutrition presented due to intractable throat cancer pain that is not responding to his usual home regimen  Patient not in room at time of visit. Per RN, pt left for radiation. Will attempt to see patient at later time. Pt is followed by [place] RD, last seen 6/14. Pt undergoing chemoradiation for head/neck cancer (oropharynx). Last chemo 6/14.  Has PEG, uses [employer] 1.4 at home, 6 cartons daily. This has been ordered.  Per weight records, weight has been stable.  Current weight: 161 lbs.  Medications reviewed.  Labs reviewed.  NUTRITION - FOCUSED PHYSICAL EXAM:  Patient not in room at time of visit.  Diet Order:   Diet Order     None       EDUCATION NEEDS:   Not appropriate for education at this time  Skin:  Skin Assessment: Reviewed RN Assessment  Last BM:  6/19  Height:   Ht Readings from Last 1 Encounters:  10/28/21 6' (1.829 m)    Weight:   Wt Readings from Last 1 Encounters:  10/30/21 73.4 kg    BMI:  There is no height or weight on file to calculate BMI.  Estimated Nutritional Needs:   Kcal:  2500-2700  Protein:  110-130g  Fluid:  2.5L/day   Clayton Bibles, MS, RD, LDN Inpatient Clinical Dietitian Contact information  available via Amion

## 2021-11-04 NOTE — Progress Notes (Signed)
PROGRESS NOTE Johnny Mata  WGN:[id number] DOB: [birth year] DOA: 11/03/2021 PCP: Patient, No Pcp Per   Brief Narrative/Hospital Course: 46 y.o. male with medical history significant of anxiety, asthma, depression, alcohol abuse, normocytic anemia, primary cancer of the tonsillar protein calorie malnutrition presented due to intractable throat cancer pain that is not responding to his usual home regimen. In ED course: Initial vital signs were temperature 98.9 F, pulse 112, respiration 20, BP 136/ mmHg O2 sat 95% on room air.  Patient was given at 1000 mL NS bolus, ketorolac 30 mg IVP, hydromorphone 1 mg IVP, acetaminophen 650 mg p.o. and diazepam 2.5 mg IVP. CBC showed leukopenia, chronic anemia - wbc 2.5, hb 8.6 g/dL platelets 204.  Patient was admitted on IV fluids, fentanyl patch scopolamine patch along with as needed lidocaine patch, oral and IV opiates. Since admission nursing has documented patient has been taking the opiates from home on his own as well as regular home despite counseling and recommendation not to use home meds or eat on his own.     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  Reports pain is not controlled despite taking narcotics fentanyl patch  at home, only thing helping him is IV Dilaudid he states.  We will request oncology input, possible steroid trial.  Continue XRT, pain control oral IV opiates, fentanyl patch,scopolamine patch.  Has been tolerating food per G-tube.  Alcohol abuse: Not actively drinking Leukopenia: Likely chemo induced.  Monitor Normocytic anemia: Hemoglobin is stable, monitor Protein calorie malnutrition moderate-augment nutritional status consult dietitian Noncompliance he has been informed by nursing staff not to take his own pain medication or eat outside food.  DVT prophylaxis: SCDs Start: 11/03/21 1513 Code Status:   Code Status: Full Code Family Communication: plan of care discussed with patient/significant other at bedside. Patient status is: Observation but remains hospitalized for ongoing pain management needing IV opiates, change to inpatient Level of care: Med-Surg   Dispo: The patient is from: home            Anticipated disposition: home once pain is controlled  Mobility Assessment (last 72 hours)     Mobility Assessment     Row Name 11/04/21 0100           Does patient have an order for bedrest or is patient medically unstable No - Continue assessment       What is the highest level of mobility based on the progressive mobility assessment? Level 6 (Walks independently in room and hall) - Balance while walking in room without assist - Complete                 Objective: Vitals last 24 hrs: Vitals:   11/03/21 1858 11/03/21 1958 11/04/21 0127 11/04/21 0417  BP: (!) 143/86 Marland Kitchen)  142/97 (!) 142/97 (!) 136/94  Pulse: 90 (!) 108 (!) 109 (!) 102  Resp: '18 18 18 16  '$ Temp: 99.9 F (37.7 C) 100.2 F (37.9 C) 98.9 F (37.2 C) 99.1 F (37.3 C)  TempSrc: Oral Oral Oral Oral  SpO2: 98% 98% 95% 97%   Weight change:   Physical Examination: General exam: alert awake,older than stated age, weak appearing. HEENT:Oral mucosa moist, Ear/Nose WNL grossly, dentition normal. Respiratory  system: bilaterally diminished BS, constantly spitting, no use of accessory muscle Cardiovascular system: S1 & S2 +, No JVD. Gastrointestinal system: Abdomen soft,NT,ND, BS+ Nervous System:Alert, awake, moving extremities and grossly nonfocal Extremities: LE edema neg,distal peripheral pulses palpable.  Skin: No rashes,no icterus. MSK: Normal muscle bulk,tone, power  Medications reviewed:  Scheduled Meds:  Chlorhexidine Gluconate Cloth  6 each Topical Daily   feeding supplement ([employer] STANDARD 1.4)  487.5 mL Oral QID   fentaNYL  1 patch Transdermal Q72H   scopolamine  1 patch Transdermal Q72H   sertraline  100 mg Per Tube q morning   Continuous Infusions:  0.9 % NaCl with KCl 20 mEq / L 100 mL/hr at 11/04/21 0341      Diet Order     None        Intake/Output Summary (Last 24 hours) at 11/04/2021 1020 Last data filed at 11/03/2021 1251 Gross per 24 hour  Intake 1000 ml  Output --  Net 1000 ml   Net IO Since Admission: 1,000 mL [11/04/21 1020]  Wt Readings from Last 3 Encounters:  10/30/21 73.4 kg  10/28/21 73.1 kg  10/21/21 72 kg     Unresulted Labs (From admission, onward)    None     Data Reviewed: I have personally reviewed following labs and imaging studies CBC: Recent Labs  Lab 10/28/21 1426 11/03/21 0829 11/04/21 0500  WBC 1.8* 2.5* 2.8*  NEUTROABS 1.3* 1.5*  --   HGB 8.9* 8.6* 7.8*  HCT 24.8* 24.8* 22.8*  MCV 89.2 93.6 95.4  PLT 123* 204 322   Basic Metabolic Panel: Recent Labs  Lab 10/28/21 1426 11/03/21 0829 11/04/21 0500  NA 132* 136 137  K 4.1 3.5 4.7  CL 94* 99 104  CO2 '31 28 27  '$ GLUCOSE 139* 99 102*  BUN '13 17 12  '$ CREATININE 0.74 0.61 0.53*  CALCIUM 9.5 8.3* 8.5*  MG 1.9  --   --    GFR: Estimated Creatinine Clearance: 122.3 mL/min (A) (by C-G formula based on SCr of 0.53 mg/dL (L)). Liver Function Tests: Recent Labs  Lab 11/03/21 0829 11/04/21 0500  AST 12* 13*  ALT 15 14  ALKPHOS 44 44  BILITOT 0.4 0.3  PROT 6.2*  6.2*  ALBUMIN 3.4* 3.1*   No results for input(s): "LIPASE", "AMYLASE" in the last 168 hours. No results for input(s): "AMMONIA" in the last 168 hours. Coagulation Profile: No results for input(s): "INR", "PROTIME" in the last 168 hours. BNP (last 3 results) No results for input(s): "PROBNP" in the last 8760 hours. HbA1C: No results for input(s): "HGBA1C" in the last 72 hours. CBG: No results for input(s): "GLUCAP" in the last 168 hours. Lipid Profile: No results for input(s): "CHOL", "HDL", "LDLCALC", "TRIG", "CHOLHDL", "LDLDIRECT" in the last 72 hours. Thyroid Function Tests: No results for input(s): "TSH", "T4TOTAL", "FREET4", "T3FREE", "THYROIDAB" in the last 72 hours. Sepsis Labs: No results for input(s): "PROCALCITON", "LATICACIDVEN" in the last 168 hours.  No results found for this or any previous visit (from the past 240 hour(s)).  Antimicrobials: Anti-infectives (From admission, onward)    None      Culture/Microbiology    Component Value Date/Time   SDES THROAT 08/19/2021 1210   SPECREQUEST NONE 08/19/2021 1210   CULT  08/19/2021 1210    NO GROUP A STREP (S.PYOGENES) ISOLATED Performed at [facility], [place] [address], [place], [place] [address]    REPTSTATUS 08/21/2021 FINAL 08/19/2021 1210    Other culture-see note  Radiology Studies: No results found.   LOS: 0 days   Antonieta Pert, MD [facility]  11/04/2021, 10:20 AM

## 2021-11-04 NOTE — Progress Notes (Signed)
RN was notified from Tele that pt HR was sutaining 160's. RN went to room, found pt vomiting in bathroom. No falls or injuries taken place. Pt had small volume of vomit in toilet. Assisted pt back to bedside and administered zofran. Pt continued to complain of pain and requested oxycodone. RN gave pt oxycodone and compazine, HR back down to 110's and pt able to feed self through peg tube and administer medications independently. RN offered to assist but pt refused.

## 2021-11-04 NOTE — Hospital Course (Addendum)
46 y.o. male with medical history significant of anxiety, asthma, depression, alcohol abuse, normocytic anemia, primary cancer of the tonsillar protein calorie malnutrition presented due to intractable throat cancer pain that is not responding to his usual home regimen. In ED course: Initial vital signs were temperature 98.9 F, pulse 112, respiration 20, BP 136/ mmHg O2 sat 95% on room air.  Patient was given at 1000 mL NS bolus, ketorolac 30 mg IVP, hydromorphone 1 mg IVP, acetaminophen 650 mg p.o. and diazepam 2.5 mg IVP. CBC showed leukopenia, chronic anemia - wbc 2.5, hb 8.6 g/dL platelets 204.  Patient was admitted on IV fluids, fentanyl patch scopolamine patch along with as needed lidocaine patch, oral and IV opiates. Since admission nursing has documented patient has been taking the opiates from home on his own as well as regular home despite counseling and recommendation not to use home meds or eat on his own.

## 2021-11-04 NOTE — Plan of Care (Signed)
Pt c/o throat pain 10/10.  PRNs administered per MAR.  Problem: Education: Goal: Knowledge of General Education information will improve Description: Including pain rating scale, medication(s)/side effects and non-pharmacologic comfort measures Outcome: Progressing   Problem: Health Behavior/Discharge Planning: Goal: Ability to manage health-related needs will improve Outcome: Progressing   Problem: Clinical Measurements: Goal: Ability to maintain clinical measurements within normal limits will improve Outcome: Progressing Goal: Will remain free from infection Outcome: Progressing Goal: Diagnostic test results will improve Outcome: Progressing Goal: Respiratory complications will improve Outcome: Progressing Goal: Cardiovascular complication will be avoided Outcome: Progressing   Problem: Activity: Goal: Risk for activity intolerance will decrease Outcome: Progressing   Problem: Nutrition: Goal: Adequate nutrition will be maintained Outcome: Progressing   Problem: Coping: Goal: Level of anxiety will decrease Outcome: Progressing   Problem: Elimination: Goal: Will not experience complications related to bowel motility Outcome: Progressing Goal: Will not experience complications related to urinary retention Outcome: Progressing   Problem: Pain Managment: Goal: General experience of comfort will improve Outcome: Progressing   Problem: Safety: Goal: Ability to remain free from injury will improve Outcome: Progressing   Problem: Skin Integrity: Goal: Risk for impaired skin integrity will decrease Outcome: Progressing

## 2021-11-05 ENCOUNTER — Inpatient Hospital Stay (HOSPITAL_COMMUNITY): Payer: Commercial Managed Care - PPO

## 2021-11-05 ENCOUNTER — Inpatient Hospital Stay: Payer: Commercial Managed Care - PPO | Admitting: Dietician

## 2021-11-05 ENCOUNTER — Ambulatory Visit: Payer: Commercial Managed Care - PPO

## 2021-11-05 ENCOUNTER — Inpatient Hospital Stay: Payer: Commercial Managed Care - PPO

## 2021-11-05 ENCOUNTER — Other Ambulatory Visit: Payer: Self-pay

## 2021-11-05 ENCOUNTER — Ambulatory Visit
Admission: RE | Admit: 2021-11-05 | Discharge: 2021-11-05 | Disposition: A | Discharge status: Home / Self Care | Payer: Commercial Managed Care - PPO | Source: Ambulatory Visit | Attending: Radiation Oncology | Admitting: Radiation Oncology

## 2021-11-05 DIAGNOSIS — R52 Pain, unspecified: Secondary | ICD-10-CM | POA: Diagnosis not present

## 2021-11-05 LAB — RAD ONC ARIA SESSION SUMMARY
Course Elapsed Days: 49
Plan Fractions Treated to Date: 33
Plan Prescribed Dose Per Fraction: 2 Gy
Plan Total Fractions Prescribed: 35
Plan Total Prescribed Dose: 70 Gy
Reference Point Dosage Given to Date: 66 Gy
Reference Point Session Dosage Given: 2 Gy
Session Number: 33

## 2021-11-05 LAB — CBC WITH DIFFERENTIAL/PLATELET
Abs Immature Granulocytes: 0.07 10*3/uL (ref 0.00–0.07)
Basophils Absolute: 0 10*3/uL (ref 0.0–0.1)
Basophils Relative: 0 %
Eosinophils Absolute: 0 10*3/uL (ref 0.0–0.5)
Eosinophils Relative: 0 %
HCT: 22 % — ABNORMAL LOW (ref 39.0–52.0)
Hemoglobin: 7.6 g/dL — ABNORMAL LOW (ref 13.0–17.0)
Immature Granulocytes: 1 %
Lymphocytes Relative: 5 %
Lymphs Abs: 0.4 10*3/uL — ABNORMAL LOW (ref 0.7–4.0)
MCH: 32.5 pg (ref 26.0–34.0)
MCHC: 34.5 g/dL (ref 30.0–36.0)
MCV: 94 fL (ref 80.0–100.0)
Monocytes Absolute: 1.7 10*3/uL — ABNORMAL HIGH (ref 0.1–1.0)
Monocytes Relative: 25 %
Neutro Abs: 4.7 10*3/uL (ref 1.7–7.7)
Neutrophils Relative %: 69 %
Platelets: 183 10*3/uL (ref 150–400)
RBC: 2.34 MIL/uL — ABNORMAL LOW (ref 4.22–5.81)
RDW: 16 % — ABNORMAL HIGH (ref 11.5–15.5)
WBC: 6.8 10*3/uL (ref 4.0–10.5)
nRBC: 0 % (ref 0.0–0.2)

## 2021-11-05 MED ORDER — POLYETHYLENE GLYCOL 3350 17 G PO PACK
17.0000 g | PACK | Freq: Two times a day (BID) | ORAL | Status: DC
  Administered 2021-11-05: 17 g via ORAL
  Filled 2021-11-05: qty 1

## 2021-11-05 MED ORDER — METHYLNALTREXONE BROMIDE 12 MG/0.6ML ~~LOC~~ SOLN
12.0000 mg | Freq: Once | SUBCUTANEOUS | Status: AC
Start: 2021-11-05 — End: 2021-11-05
  Administered 2021-11-05: 12 mg via SUBCUTANEOUS
  Filled 2021-11-05: qty 0.6

## 2021-11-05 MED ORDER — ACETAMINOPHEN 325 MG PO TABS
650.0000 mg | ORAL_TABLET | Freq: Four times a day (QID) | ORAL | Status: DC | PRN
  Administered 2021-11-05: 650 mg
  Filled 2021-11-05: qty 2

## 2021-11-05 MED ORDER — METHADONE HCL 10 MG PO TABS
10.0000 mg | ORAL_TABLET | Freq: Two times a day (BID) | ORAL | Status: DC
  Administered 2021-11-05 – 2021-11-07 (×6): 10 mg via ORAL
  Filled 2021-11-05 (×6): qty 1

## 2021-11-05 MED ORDER — DEXAMETHASONE 4 MG PO TABS
6.0000 mg | ORAL_TABLET | Freq: Every day | ORAL | Status: DC
  Administered 2021-11-05 – 2021-11-08 (×4): 6 mg via ORAL
  Filled 2021-11-05 (×3): qty 2

## 2021-11-05 MED ORDER — KETOROLAC TROMETHAMINE 15 MG/ML IJ SOLN
15.0000 mg | Freq: Three times a day (TID) | INTRAMUSCULAR | Status: DC
Start: 2021-11-05 — End: 2021-11-07
  Administered 2021-11-05 – 2021-11-07 (×6): 15 mg via INTRAVENOUS
  Filled 2021-11-05 (×6): qty 1

## 2021-11-05 MED ORDER — GLYCOPYRROLATE 0.2 MG/ML IJ SOLN
0.3000 mg | Freq: Two times a day (BID) | INTRAMUSCULAR | Status: DC
  Administered 2021-11-05 – 2021-11-07 (×6): 0.3 mg via INTRAVENOUS
  Filled 2021-11-05 (×6): qty 2

## 2021-11-05 MED ORDER — ALPRAZOLAM 0.5 MG PO TABS
0.5000 mg | ORAL_TABLET | Freq: Three times a day (TID) | ORAL | Status: DC
  Administered 2021-11-05 – 2021-11-10 (×15): 0.5 mg via ORAL
  Filled 2021-11-05 (×15): qty 1

## 2021-11-05 MED ORDER — IOHEXOL 300 MG/ML  SOLN
100.0000 mL | Freq: Once | INTRAMUSCULAR | Status: AC | PRN
  Administered 2021-11-05: 100 mL via INTRAVENOUS

## 2021-11-05 NOTE — Consult Note (Signed)
Palliative Care Inpatient Consultation Note Reason: Pain and Symptom Management  Narrative:  46yo s/p chemotherapy and receiving radiation for tonsillar cancer, admitted with intractable pain.   Concerned about fever overnight and now 2X previous days dose of opioids.  Used '11mg'$  of Hydromorphone IV and '60mg'$  of oxycodone  Patient describes pain as a "knife in his throat and that his tongue is on fire".  Assessment:  Cancer Related Pain ([facility] Tonsillar) Opioid Induced Constipation Anxiety State, Severe Fever, Tacycardia Mucositis   Recommendations:  Methadone '10mg'$  BID for neuropathic pain Continue PRN IV hydromorphone while methadone reaches steady state. Discontinue Fentanyl Patch Discontinue Oxycodone Start IV Toradol for inflammatory pain q8hours for 2-3 days Start a short burst taper of DEXAMETHASONE '6mg'$  today, then '4mg'$ , '2mg'$  and '1mg'$  and stop. Relistor for Opioid Induced Constipation He has hx of ETOH last drink 2018 per report. He is extremely anxious, vomitted and his HR is in the 150's. Will start him on alprazolam 0.5 TID 9. Oral secretions are difficult -will start him on glycopyrrolate 0.3 TID in addition to his scop patch. Doxepin mouthwash for mucositis 11. Consider imaging of his neck and throat given drastically worsened pain and fever overnight with tachycardia and temperature dysregulation.  Seen with Medical Oncology at bedside. Care discussed with RN for symptom management.  Lane Hacker, DO Palliative Medicine   Time: 90 minutes

## 2021-11-05 NOTE — Progress Notes (Signed)
RN went to assess pt and follow up, pt complaining of 10/10 pain to throat. Medication was administered post vitals check. Pt had a temp of 101.4, HR sustaining in 120's, and increased blood pressure (noted in Flowsheet VS). RN stayed with pt and reassessed. Temp went down and hr decreased. To low 110's and bp dropped down to 133/88.

## 2021-11-05 NOTE — Progress Notes (Signed)
PROGRESS NOTE JULUIS FITZSIMMONS  ALP:[id number] DOB: 08-Feb-1976 DOA: 11/03/2021 PCP: Patient, No Pcp Per   Brief Narrative/Hospital Course: 46 y.o. male with medical history significant of anxiety, asthma, depression, alcohol abuse, normocytic anemia, primary cancer of the tonsil, protein calorie malnutrition presented due to intractable throat cancer pain that is not responding to his usual home regimen. In ED course: Initial vital signs were temperature 98.9 F, pulse 112, respiration 20, BP 136/ mmHg O2 sat 95% on room air.  Patient was given at 1000 mL NS bolus, ketorolac 30 mg IVP, hydromorphone 1 mg IVP, acetaminophen 650 mg p.o. and diazepam 2.5 mg IVP. CBC showed leukopenia, chronic anemia - wbc 2.5, hb 8.6 g/dL platelets 204.  Patient was admitted on IV fluids, fentanyl patch , scopolamine patch along with as needed lidocaine patch, oral and IV opiates. Remains with uncontrolled cancer related pain, febrile overnight.   Subjective: Patient seen and examined.  Ongoing severe pain on his throat.  Using around-the-clock as needed medications and Dilaudid. Overnight events noted.  He had episode of fever 101.  Spitting up more frequently but denies any cough or vomiting.  Distressed with pain.  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  Despite taking narcotics at home.   Appreciate palliative care input  Starting on methadone, alprazolam, dexamethasone trial, scopolamine, IV and oral opiates as needed.  Currently continues radiation therapy.  Followed by oncology and radiation oncology.  Alcohol abuse: Not actively  drinking  Leukopenia: Likely chemo induced.  Recheck differentials today.  Normocytic anemia: Hemoglobin is stable, monitor  Protein calorie malnutrition moderate-started tube feeding.  Febrile episode: Likely metabolic fever from tumor. CBC with differential We will check chest x-ray Blood cultures due to presence of port.  Holding off on antibiotics.  May need to scan his throat, however I will defer this to oncology.   DVT prophylaxis: SCDs Start: 11/03/21 1513 Code Status:   Code Status: Full Code Family Communication: None. Patient status is: Inpatient.  Remains inpatient due to uncontrolled pain. Level of care: Med-Surg   Dispo: The patient is from: home            Anticipated disposition: home once pain is controlled  Mobility Assessment (last 72 hours)     Mobility Assessment     Row Name 11/05/21 1200 11/04/21 0945 11/04/21 0100       Does patient have an order for bedrest or is patient medically unstable No - Continue assessment No - Continue assessment No - Continue assessment     What is the highest level of mobility based on the progressive mobility assessment? Level 6 (Walks independently in room and hall) - Balance while walking in room without assist - Complete Level 6 (Walks independently in room and hall) - Balance while walking in room without assist - Complete Level 6 (Walks independently in room and hall) - Balance while walking in room without assist - Complete               Objective: Vitals last 24 hrs: Vitals:   11/05/21 0100 11/05/21 0245 11/05/21 0502 11/05/21 0852  BP: 133/88 (!) 139/91 Marland Kitchen)  155/87 130/83  Pulse: (!) 113 (!) 123 (!) 134 (!) 128  Resp:  16  18  Temp: 100.3 F (37.9 C) 99.8 F (37.7 C) 100.3 F (37.9 C) 99.4 F (37.4 C)  TempSrc: Oral Oral Oral Oral  SpO2: 97% 96% 92% 98%   Weight change:   General: Chronically sick looking.  In moderate distress and discomfort due to pain. Constantly spitting up, Right subclavian  Port-A-Cath clean and dry. Cardiovascular: S1-S2 normal.  Regular rate rhythm.  Tachycardic. Respiratory: Bilateral clear.  Some conducted sounds from the throat. Gastrointestinal: Soft.  Nontender.  G-tube is clean and dry. Ext: No swelling or edema.  No cyanosis. Neuro: Alert oriented x4.  Anxious and in moderate distress with pain. Musculoskeletal: No deformities. Skin: Intact.    Medications reviewed:  Scheduled Meds:  ALPRAZolam  0.5 mg Oral TID   Chlorhexidine Gluconate Cloth  6 each Topical Daily   dexamethasone  6 mg Oral Daily   feeding supplement (KATE FARMS STANDARD 1.4)  487.5 mL Oral QID   glycopyrrolate  0.3 mg Intravenous BID   ketorolac  15 mg Intravenous Q8H   methadone  10 mg Oral Q12H   methylnaltrexone  12 mg Subcutaneous Once   scopolamine  1 patch Transdermal Q72H   sertraline  100 mg Per Tube q morning   Continuous Infusions:  0.9 % NaCl with KCl 20 mEq / L 50 mL/hr at 11/05/21 1231      Diet Order     None       No intake or output data in the 24 hours ending 11/05/21 1317  Net IO Since Admission: 1,000 mL [11/05/21 1317]  Wt Readings from Last 3 Encounters:  10/30/21 73.4 kg  10/28/21 73.1 kg  10/21/21 72 kg     Unresulted Labs (From admission, onward)     Start     Ordered   11/05/21 1309  CBC with Differential/Platelet  Once,   R       Question:  Specimen collection method  Answer:  Unit=Unit collect   11/05/21 1309   11/05/21 1309  Culture, blood (Routine X 2) w Reflex to ID Panel  BLOOD CULTURE X 2,   R      11/05/21 1309          Data Reviewed: I have personally reviewed following labs and imaging studies CBC: Recent Labs  Lab 11/03/21 0829 11/04/21 0500  WBC 2.5* 2.8*  NEUTROABS 1.5*  --   HGB 8.6* 7.8*  HCT 24.8* 22.8*  MCV 93.6 95.4  PLT 204 932   Basic Metabolic Panel: Recent Labs  Lab 11/03/21 0829 11/04/21 0500  NA 136 137  K 3.5 4.7  CL 99 104  CO2 28 27  GLUCOSE 99 102*  BUN 17 12  CREATININE 0.61  0.53*  CALCIUM 8.3* 8.5*   GFR: Estimated Creatinine Clearance: 122.3 mL/min (A) (by C-G formula based on SCr of 0.53 mg/dL (L)). Liver Function Tests: Recent Labs  Lab 11/03/21 0829 11/04/21 0500  AST 12* 13*  ALT 15 14  ALKPHOS 44 44  BILITOT 0.4 0.3  PROT 6.2* 6.2*  ALBUMIN 3.4* 3.1*   No results for input(s): "LIPASE", "AMYLASE" in the last 168 hours. No results for input(s): "AMMONIA" in the last 168 hours. Coagulation Profile: No results for input(s): "INR", "PROTIME" in the last 168 hours. BNP (last 3 results) No results for input(s): "PROBNP" in the last 8760 hours. HbA1C: No results for input(s): "HGBA1C" in the last  72 hours. CBG: No results for input(s): "GLUCAP" in the last 168 hours. Lipid Profile: No results for input(s): "CHOL", "HDL", "LDLCALC", "TRIG", "CHOLHDL", "LDLDIRECT" in the last 72 hours. Thyroid Function Tests: No results for input(s): "TSH", "T4TOTAL", "FREET4", "T3FREE", "THYROIDAB" in the last 72 hours. Sepsis Labs: No results for input(s): "PROCALCITON", "LATICACIDVEN" in the last 168 hours.  No results found for this or any previous visit (from the past 240 hour(s)).  Antimicrobials: Anti-infectives (From admission, onward)    None      Culture/Microbiology    Component Value Date/Time   SDES THROAT 08/19/2021 1210   SPECREQUEST NONE 08/19/2021 1210   CULT  08/19/2021 1210    NO GROUP A STREP (S.PYOGENES) ISOLATED Performed at [facility], [place] [address] [place], [place] [address]    REPTSTATUS 08/21/2021 FINAL 08/19/2021 1210    Other culture-see note  Radiology Studies: No results found.   LOS: 1 day   Total time spent: 35 minutes Barb Merino, MD [facility]  11/05/2021, 1:17 PM

## 2021-11-05 NOTE — Progress Notes (Signed)
Patient HR remain 110-120 at rest. N.P. oncall informed. Patient no distress at this time.. Will continue to monitor.

## 2021-11-05 NOTE — Progress Notes (Signed)
  Transition of Care [facility]) Screening Note   Patient Details  Name: Johnny Mata Date of Birth: 01-11-1976   Transition of Care [facility]) CM/SW Contact:    Ashish Rossetti, Marjie Skiff, RN Phone Number: 11/05/2021, 2:32 PM    Transition of Care Department [facility]) has reviewed patient and no TOC needs have been identified at this time. We will continue to monitor patient advancement through interdisciplinary progression rounds. If new patient transition needs arise, please place a TOC consult.

## 2021-11-05 NOTE — Plan of Care (Signed)
Pt continues to report pain at 10/10.  PRNs administered per MAR.  Problem: Education: Goal: Knowledge of General Education information will improve Description: Including pain rating scale, medication(s)/side effects and non-pharmacologic comfort measures Outcome: Progressing   Problem: Health Behavior/Discharge Planning: Goal: Ability to manage health-related needs will improve Outcome: Progressing   Problem: Clinical Measurements: Goal: Ability to maintain clinical measurements within normal limits will improve Outcome: Progressing Goal: Will remain free from infection Outcome: Progressing Goal: Diagnostic test results will improve Outcome: Progressing Goal: Respiratory complications will improve Outcome: Progressing Goal: Cardiovascular complication will be avoided Outcome: Progressing   Problem: Activity: Goal: Risk for activity intolerance will decrease Outcome: Progressing   Problem: Nutrition: Goal: Adequate nutrition will be maintained Outcome: Progressing   Problem: Coping: Goal: Level of anxiety will decrease Outcome: Progressing   Problem: Elimination: Goal: Will not experience complications related to bowel motility Outcome: Progressing Goal: Will not experience complications related to urinary retention Outcome: Progressing   Problem: Safety: Goal: Ability to remain free from injury will improve Outcome: Progressing   Problem: Skin Integrity: Goal: Risk for impaired skin integrity will decrease Outcome: Progressing

## 2021-11-06 ENCOUNTER — Ambulatory Visit
Admission: RE | Admit: 2021-11-06 | Discharge: 2021-11-06 | Disposition: A | Discharge status: Home / Self Care | Payer: Commercial Managed Care - PPO | Source: Ambulatory Visit | Attending: Radiation Oncology | Admitting: Radiation Oncology

## 2021-11-06 ENCOUNTER — Inpatient Hospital Stay: Payer: Commercial Managed Care - PPO | Admitting: Dietician

## 2021-11-06 ENCOUNTER — Inpatient Hospital Stay: Payer: Commercial Managed Care - PPO

## 2021-11-06 ENCOUNTER — Inpatient Hospital Stay: Payer: Commercial Managed Care - PPO | Admitting: Nurse Practitioner

## 2021-11-06 ENCOUNTER — Other Ambulatory Visit: Payer: Self-pay

## 2021-11-06 DIAGNOSIS — R52 Pain, unspecified: Secondary | ICD-10-CM | POA: Diagnosis not present

## 2021-11-06 LAB — RAD ONC ARIA SESSION SUMMARY
Course Elapsed Days: 50
Plan Fractions Treated to Date: 34
Plan Prescribed Dose Per Fraction: 2 Gy
Plan Total Fractions Prescribed: 35
Plan Total Prescribed Dose: 70 Gy
Reference Point Dosage Given to Date: 68 Gy
Reference Point Session Dosage Given: 2 Gy
Session Number: 34

## 2021-11-06 MED ORDER — MAGIC MOUTHWASH W/LIDOCAINE
15.0000 mL | Freq: Four times a day (QID) | ORAL | Status: DC
  Administered 2021-11-06 – 2021-11-08 (×6): 15 mL via ORAL
  Filled 2021-11-06 (×8): qty 15

## 2021-11-06 MED ORDER — CELECOXIB 200 MG PO CAPS
200.0000 mg | ORAL_CAPSULE | Freq: Two times a day (BID) | ORAL | Status: DC
  Administered 2021-11-07 (×2): 200 mg via ORAL
  Filled 2021-11-06 (×3): qty 1

## 2021-11-06 MED ORDER — HYDROMORPHONE HCL 4 MG PO TABS
4.0000 mg | ORAL_TABLET | Freq: Four times a day (QID) | ORAL | Status: DC
  Administered 2021-11-06 – 2021-11-07 (×4): 4 mg via ORAL
  Filled 2021-11-06 (×6): qty 1

## 2021-11-06 MED ORDER — NYSTATIN 100000 UNIT/ML MT SUSP
5.0000 mL | Freq: Four times a day (QID) | OROMUCOSAL | Status: DC
  Administered 2021-11-06 – 2021-11-10 (×13): 500000 [IU] via ORAL
  Filled 2021-11-06 (×13): qty 5

## 2021-11-06 MED ORDER — OXYCODONE HCL 5 MG/5ML PO SOLN
5.0000 mg | Freq: Once | ORAL | Status: AC | PRN
  Administered 2021-11-06: 5 mg via ORAL
  Filled 2021-11-06: qty 5

## 2021-11-06 NOTE — Plan of Care (Signed)
Pt rating pain at 10/10.  Remains tachy.  MD notified and all meds/PRNs administered per [facility].  No BM for three days.  Problem: Education: Goal: Knowledge of General Education information will improve Description: Including pain rating scale, medication(s)/side effects and non-pharmacologic comfort measures Outcome: Progressing   Problem: Health Behavior/Discharge Planning: Goal: Ability to manage health-related needs will improve Outcome: Progressing   Problem: Clinical Measurements: Goal: Ability to maintain clinical measurements within normal limits will improve Outcome: Progressing Goal: Will remain free from infection Outcome: Progressing Goal: Diagnostic test results will improve Outcome: Progressing Goal: Respiratory complications will improve Outcome: Progressing   Problem: Activity: Goal: Risk for activity intolerance will decrease Outcome: Progressing   Problem: Nutrition: Goal: Adequate nutrition will be maintained Outcome: Progressing   Problem: Coping: Goal: Level of anxiety will decrease Outcome: Progressing   Problem: Elimination: Goal: Will not experience complications related to urinary retention Outcome: Progressing   Problem: Safety: Goal: Ability to remain free from injury will improve Outcome: Progressing   Problem: Skin Integrity: Goal: Risk for impaired skin integrity will decrease Outcome: Progressing

## 2021-11-06 NOTE — Progress Notes (Signed)
PROGRESS NOTE ALBARO DEVINEY  AYT:[id number] DOB: 07-13-1975 DOA: 11/03/2021 PCP: Patient, No Pcp Per   Brief Narrative/Hospital Course: 46 y.o. male with medical history significant of anxiety, asthma, depression, alcohol abuse, normocytic anemia, primary cancer of the tonsil, protein calorie malnutrition presented due to intractable throat cancer pain that is not responding to his usual home regimen. In ED course: Initial vital signs were temperature 98.9 F, pulse 112, respiration 20, BP 136/ mmHg O2 sat 95% on room air.  Patient was given at 1000 mL NS bolus, ketorolac 30 mg IVP, hydromorphone 1 mg IVP, acetaminophen 650 mg p.o. and diazepam 2.5 mg IVP. CBC showed leukopenia, chronic anemia - wbc 2.5, hb 8.6 g/dL platelets 204.  Patient was admitted on IV fluids, fentanyl patch , scopolamine patch along with as needed lidocaine patch, oral and IV opiates. Remains with uncontrolled cancer related pain, had episode of fever 6/21.   Subjective: Patient seen and examined.  Ongoing throat pain and excess secretions.  Afebrile overnight.  Remains tachycardic when he is in pain. Using multiple pain medications, he was wondering whether he can have more Dilaudid.  Will discuss with palliative care team.  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     Appreciate palliative care input  Starting on methadone, alprazolam, dexamethasone trial, scopolamine, IV and oral opiates as needed.  Currently continues radiation therapy.  Followed by oncology and radiation oncology.  Normocytic anemia: Hemoglobin is stable,  monitor  Protein calorie malnutrition moderate-tolerating tube feeding.  Febrile episode: Likely metabolic fever from tumor. Chest x-ray normal.  CT scan of the neck with no evidence of inflammation/abscess. Blood cultures drawn, negative so far.  Afebrile overnight. We will monitor.   DVT prophylaxis: SCDs Start: 11/03/21 1513 Code Status:   Code Status: Full Code Family Communication: None. Patient status is: Inpatient.  Remains inpatient due to uncontrolled pain. Level of care: Med-Surg   Dispo: The patient is from: home            Anticipated disposition: home once pain is controlled  Mobility Assessment (last 72 hours)     Mobility Assessment     Row Name 11/06/21 0815 11/05/21 1200 11/04/21 0945 11/04/21 0100     Does patient have an order for bedrest or is patient medically unstable No - Continue assessment No - Continue assessment No - Continue assessment No - Continue assessment    What is the highest level of mobility based on the progressive mobility assessment? Level 6 (Walks independently in room and hall) - Balance while walking in room without assist - Complete Level 6 (Walks independently in room and hall) - Balance while walking in room without assist - Complete Level 6 (Walks independently in room and hall) - Balance while walking in room without assist - Complete Level 6 (Walks independently in room and hall) - Balance while walking in room without assist - Complete              Objective: Vitals last 24 hrs: Vitals:   11/05/21 1812 11/05/21 1943 11/06/21 0146 11/06/21 0424  BP: 127/86 (!) 163/104 (!) 129/93 136/83  Pulse: Marland Kitchen)  102 (!) 110 (!) 110 (!) 108  Resp: '18 20 20 20  '$ Temp: (!) 97.3 F (36.3 C) 98.7 F (37.1 C) 98.7 F (37.1 C) 98.1 F (36.7 C)  TempSrc: Oral Oral Oral Oral  SpO2: 96% 99% 93% 93%   Weight change:   General: Chronically sick looking.  Distressed episodically with pain. Constantly spitting up, Right subclavian Port-A-Cath  clean and dry. Thick secretions in the mouth, oropharynx with thick secretions but no obvious lesions. Cardiovascular: S1-S2 normal.  Regular rate rhythm.  Tachycardic. Respiratory: Bilateral clear.  Some conducted sounds from the throat. Gastrointestinal: Soft.  Nontender.  G-tube is clean and dry. Ext: No swelling or edema.  No cyanosis. Neuro: Alert oriented x4.  Anxious and in moderate distress with pain. Musculoskeletal: No deformities. Skin: Intact.    Medications reviewed:  Scheduled Meds:  ALPRAZolam  0.5 mg Oral TID   Chlorhexidine Gluconate Cloth  6 each Topical Daily   dexamethasone  6 mg Oral Daily   feeding supplement (KATE FARMS STANDARD 1.4)  487.5 mL Oral QID   glycopyrrolate  0.3 mg Intravenous BID   ketorolac  15 mg Intravenous Q8H   methadone  10 mg Oral Q12H   nystatin  5 mL Oral QID   scopolamine  1 patch Transdermal Q72H   sertraline  100 mg Per Tube q morning   Continuous Infusions:  0.9 % NaCl with KCl 20 mEq / L 50 mL/hr at 11/06/21 0341      Diet Order     None        Intake/Output Summary (Last 24 hours) at 11/06/2021 1135 Last data filed at 11/06/2021 0329 Gross per 24 hour  Intake 3638.92 ml  Output --  Net 3638.92 ml    Net IO Since Admission: 4,638.92 mL [11/06/21 1135]  Wt Readings from Last 3 Encounters:  10/30/21 73.4 kg  10/28/21 73.1 kg  10/21/21 72 kg     Unresulted Labs (From admission, onward)    None     Data Reviewed: I have personally reviewed following labs and imaging studies CBC: Recent Labs  Lab 11/03/21 0829 11/04/21 0500 11/05/21 1333  WBC 2.5* 2.8* 6.8  NEUTROABS 1.5*  --  4.7  HGB 8.6* 7.8* 7.6*  HCT 24.8* 22.8* 22.0*  MCV 93.6 95.4 94.0  PLT 204 174 353   Basic Metabolic Panel: Recent Labs  Lab 11/03/21 0829 11/04/21 0500  NA 136 137  K 3.5 4.7  CL 99 104  CO2 28 27  GLUCOSE 99 102*  BUN 17 12  CREATININE 0.61 0.53*  CALCIUM 8.3* 8.5*   GFR: Estimated Creatinine Clearance: 122.3  mL/min (A) (by C-G formula based on SCr of 0.53 mg/dL (L)). Liver Function Tests: Recent Labs  Lab 11/03/21 0829 11/04/21 0500  AST 12* 13*  ALT 15 14  ALKPHOS 44 44  BILITOT 0.4 0.3  PROT 6.2* 6.2*  ALBUMIN 3.4* 3.1*   No results for input(s): "LIPASE", "AMYLASE" in the last 168 hours. No results for input(s): "AMMONIA" in the last 168 hours. Coagulation Profile: No results for input(s): "INR", "PROTIME" in the last 168 hours. BNP (last 3 results) No results for input(s): "PROBNP" in the last 8760 hours. HbA1C: No results for input(s): "HGBA1C" in the last 72 hours. CBG: No results for input(s): "GLUCAP" in the last 168 hours. Lipid Profile: No results for input(s): "CHOL", "HDL", "LDLCALC", "TRIG", "CHOLHDL", "LDLDIRECT" in the last 72 hours. Thyroid Function Tests: No results for input(s): "TSH", "T4TOTAL", "FREET4", "T3FREE", "  THYROIDAB" in the last 72 hours. Sepsis Labs: No results for input(s): "PROCALCITON", "LATICACIDVEN" in the last 168 hours.  Recent Results (from the past 240 hour(s))  Culture, blood (Routine X 2) w Reflex to ID Panel     Status: None (Preliminary result)   Collection Time: 11/05/21  1:31 PM   Specimen: BLOOD  Result Value Ref Range Status   Specimen Description   Final    BLOOD LEFT ANTECUBITAL Performed at [facility] [address], [place], [place] [address]    Special Requests   Final    BOTTLES DRAWN AEROBIC ONLY Blood Culture adequate volume Performed at [facility] [address], [place], [place] [address]    Culture   Final    NO GROWTH < 24 HOURS Performed at [facility] [address] [place], [place] [address]    Report Status PENDING  Incomplete  Culture, blood (Routine X 2) w Reflex to ID Panel     Status: None (Preliminary result)   Collection Time: 11/05/21  1:31 PM   Specimen: BLOOD  Result Value Ref Range Status   Specimen Description   Final    BLOOD BLOOD LEFT  HAND Performed at [facility] [address] [place], [place] [address]    Special Requests   Final    BOTTLES DRAWN AEROBIC ONLY Blood Culture adequate volume Performed at [facility] [address], [place], [place] [address]    Culture   Final    NO GROWTH < 24 HOURS Performed at [facility] [address] [place], [place] [address]    Report Status PENDING  Incomplete    Antimicrobials: Anti-infectives (From admission, onward)    None      Culture/Microbiology    Component Value Date/Time   SDES  11/05/2021 1331    BLOOD LEFT ANTECUBITAL Performed at [facility] [address], [place], [place] [address]    SDES  11/05/2021 1331    BLOOD BLOOD LEFT HAND Performed at [facility], [place] [address], [place], [place] [address]    SPECREQUEST  11/05/2021 1331    BOTTLES DRAWN AEROBIC ONLY Blood Culture adequate volume Performed at [facility] [address] [place], [place] [address]    SPECREQUEST  11/05/2021 1331    BOTTLES DRAWN AEROBIC ONLY Blood Culture adequate volume Performed at [facility] [address], [place], [place] [address]    CULT  11/05/2021 1331    NO GROWTH < 24 HOURS Performed at [facility] [address] [place], [place] [address]    CULT  11/05/2021 1331    NO GROWTH < 24 HOURS Performed at [facility], [place] [address], [place], [place] [address]    REPTSTATUS PENDING 11/05/2021 1331   REPTSTATUS PENDING 11/05/2021 1331    Other culture-see note  Radiology Studies: CT SOFT TISSUE NECK W CONTRAST  Result Date: 11/05/2021 CLINICAL DATA:  Cancer of nasopharynx and tonsillar fossa. Fever. Worsening swelling and dysphagia. EXAM: CT NECK WITH CONTRAST TECHNIQUE: Multidetector CT imaging of the neck was performed using the standard protocol following the bolus administration of intravenous contrast.  RADIATION DOSE REDUCTION: This exam was performed according to the departmental dose-optimization program which includes automated exposure control, adjustment of the mA and/or kV according to patient size and/or use of iterative reconstruction technique. CONTRAST:  144m OMNIPAQUE IOHEXOL 300 MG/ML  SOLN  COMPARISON:  CT neck 08/19/2021 FINDINGS: Pharynx and larynx: Diffuse mucosal edema in the pharynx likely due to interval radiation. Masslike swelling in the right tonsil and right lateral oropharynx has improved due to treated tumor. Epiglottis and larynx normal. Airway intact. Salivary glands: No inflammation, mass, or stone. Thyroid: Negative Lymph nodes: Interval improvement in right retropharyngeal lymph node. Improvement in right-sided lymph nodes in the neck due to treated tumor. No malignant nodes are present on today's study. Vascular: Normal vascular enhancement. Port-A-Cath right jugular vein. Limited intracranial: Negative Visualized orbits: Negative Mastoids and visualized paranasal sinuses: Mild mucosal edema paranasal sinuses. Mastoid clear bilaterally Skeleton: Cervical spondylosis. ACDF C4 through C6. No acute skeletal abnormality. Upper chest: Lung apices clear bilaterally Other: None IMPRESSION: 1. No evidence of abscess or acute infection in the neck 2. Significant improvement in tumor in the right tonsil and oropharynx. Improvement in retropharyngeal node on the right. Improvement in right-sided lymph nodes. Electronically Signed   By: Franchot Gallo M.D.   On: 11/05/2021 17:39   DG CHEST PORT 1 VIEW  Result Date: 11/05/2021 CLINICAL DATA:  Nasopharyngeal cancer, intractable pain, asthma, malnutrition EXAM: PORTABLE CHEST 1 VIEW COMPARISON:  Portable exam 1307 hours without priors for comparison FINDINGS: RIGHT jugular Port-A-Cath with tip projecting over SVC. Normal heart size, mediastinal contours, and pulmonary vascularity. Question 7 mm RIGHT midlung nodule. Lungs otherwise clear. No  infiltrate, pleural effusion, or pneumothorax. No acute osseous findings. IMPRESSION: Question 7 mm RIGHT upper lobe nodule; CT chest recommended for further evaluation. Electronically Signed   By: Lavonia Dana M.D.   On: 11/05/2021 13:48     LOS: 2 days   Total time spent: 35 minutes Barb Merino, MD [facility]  11/06/2021, 11:35 AM

## 2021-11-07 ENCOUNTER — Ambulatory Visit
Admission: RE | Admit: 2021-11-07 | Discharge: 2021-11-07 | Disposition: A | Discharge status: Home / Self Care | Payer: Commercial Managed Care - PPO | Source: Ambulatory Visit | Attending: Radiation Oncology | Admitting: Radiation Oncology

## 2021-11-07 ENCOUNTER — Other Ambulatory Visit: Payer: Self-pay

## 2021-11-07 ENCOUNTER — Inpatient Hospital Stay: Payer: Commercial Managed Care - PPO | Admitting: Hematology and Oncology

## 2021-11-07 ENCOUNTER — Encounter: Payer: Self-pay | Admitting: Radiation Oncology

## 2021-11-07 DIAGNOSIS — R52 Pain, unspecified: Secondary | ICD-10-CM | POA: Diagnosis not present

## 2021-11-07 LAB — CBC WITH DIFFERENTIAL/PLATELET
Abs Immature Granulocytes: 0.05 10*3/uL (ref 0.00–0.07)
Basophils Absolute: 0 10*3/uL (ref 0.0–0.1)
Basophils Relative: 0 %
Eosinophils Absolute: 0 10*3/uL (ref 0.0–0.5)
Eosinophils Relative: 0 %
HCT: 21.5 % — ABNORMAL LOW (ref 39.0–52.0)
Hemoglobin: 7.3 g/dL — ABNORMAL LOW (ref 13.0–17.0)
Immature Granulocytes: 1 %
Lymphocytes Relative: 6 %
Lymphs Abs: 0.2 10*3/uL — ABNORMAL LOW (ref 0.7–4.0)
MCH: 32.7 pg (ref 26.0–34.0)
MCHC: 34 g/dL (ref 30.0–36.0)
MCV: 96.4 fL (ref 80.0–100.0)
Monocytes Absolute: 1 10*3/uL (ref 0.1–1.0)
Monocytes Relative: 27 %
Neutro Abs: 2.6 10*3/uL (ref 1.7–7.7)
Neutrophils Relative %: 66 %
Platelets: 219 10*3/uL (ref 150–400)
RBC: 2.23 MIL/uL — ABNORMAL LOW (ref 4.22–5.81)
RDW: 17 % — ABNORMAL HIGH (ref 11.5–15.5)
WBC: 3.9 10*3/uL — ABNORMAL LOW (ref 4.0–10.5)
nRBC: 0 % (ref 0.0–0.2)

## 2021-11-07 LAB — COMPREHENSIVE METABOLIC PANEL
ALT: 13 U/L (ref 0–44)
AST: 13 U/L — ABNORMAL LOW (ref 15–41)
Albumin: 3 g/dL — ABNORMAL LOW (ref 3.5–5.0)
Alkaline Phosphatase: 43 U/L (ref 38–126)
Anion gap: 8 (ref 5–15)
BUN: 18 mg/dL (ref 6–20)
CO2: 29 mmol/L (ref 22–32)
Calcium: 8.9 mg/dL (ref 8.9–10.3)
Chloride: 100 mmol/L (ref 98–111)
Creatinine, Ser: 0.68 mg/dL (ref 0.61–1.24)
GFR, Estimated: >60 mL/min (ref >60)
Glucose, Bld: 94 mg/dL (ref 70–99)
Potassium: 4.2 mmol/L (ref 3.5–5.1)
Sodium: 137 mmol/L (ref 135–145)
Total Bilirubin: 0.1 mg/dL — ABNORMAL LOW (ref 0.3–1.2)
Total Protein: 5.8 g/dL — ABNORMAL LOW (ref 6.5–8.1)

## 2021-11-07 LAB — RAD ONC ARIA SESSION SUMMARY
Course Elapsed Days: 51
Plan Fractions Treated to Date: 35
Plan Prescribed Dose Per Fraction: 2 Gy
Plan Total Fractions Prescribed: 35
Plan Total Prescribed Dose: 70 Gy
Reference Point Dosage Given to Date: 70 Gy
Reference Point Session Dosage Given: 2 Gy
Session Number: 35

## 2021-11-07 MED ORDER — HYDROMORPHONE HCL 4 MG PO TABS
4.0000 mg | ORAL_TABLET | ORAL | Status: DC
  Administered 2021-11-07 – 2021-11-08 (×4): 4 mg via ORAL
  Filled 2021-11-07 (×4): qty 1

## 2021-11-07 MED ORDER — HYDROMORPHONE HCL 2 MG PO TABS
2.0000 mg | ORAL_TABLET | Freq: Once | ORAL | Status: AC
  Administered 2021-11-07: 2 mg via ORAL
  Filled 2021-11-07: qty 1

## 2021-11-07 MED ORDER — KETOROLAC TROMETHAMINE 15 MG/ML IJ SOLN
15.0000 mg | Freq: Four times a day (QID) | INTRAMUSCULAR | Status: DC | PRN
  Administered 2021-11-07: 15 mg via INTRAVENOUS
  Filled 2021-11-07: qty 1

## 2021-11-07 NOTE — Progress Notes (Signed)
Oncology Nurse Navigator Documentation   Met with Deonte before final RT to offer support and to celebrate end of radiation treatment.   Provided verbal post-RT guidance: Importance of keeping all follow-up appts, especially those with Nutrition and SLP. Importance of protecting treatment area from sun. Continuation of Sonafine application 2-3 times daily, application of antibiotic ointment to areas of raw skin; when supply of Sonafine exhausted transition to OTC lotion with vitamin E.  Explained my role as navigator will continue for several more months, encouraged him to call me with needs/concerns.    Hedda Slade RN, BSN, OCN Head & Neck Oncology Nurse Navigator [facility] Phone # [phone]  Fax # [phone]

## 2021-11-08 ENCOUNTER — Encounter (HOSPITAL_COMMUNITY): Payer: Self-pay | Admitting: Internal Medicine

## 2021-11-08 ENCOUNTER — Other Ambulatory Visit (HOSPITAL_COMMUNITY): Payer: Self-pay

## 2021-11-08 DIAGNOSIS — Z515 Encounter for palliative care: Secondary | ICD-10-CM

## 2021-11-08 DIAGNOSIS — R52 Pain, unspecified: Secondary | ICD-10-CM | POA: Diagnosis not present

## 2021-11-08 MED ORDER — GLYCOPYRROLATE 1 MG PO TABS: 1.0000 mg | ORAL_TABLET | Freq: Three times a day (TID) | ORAL | 0 refills | Status: DC

## 2021-11-08 MED ORDER — ALPRAZOLAM 0.5 MG PO TABS
0.5000 mg | ORAL_TABLET | Freq: Three times a day (TID) | ORAL | 0 refills | Status: DC
  Filled 2021-11-08: qty 90, 30d supply, fill #0

## 2021-11-08 MED ORDER — GLYCOPYRROLATE 1 MG PO TABS
1.0000 mg | ORAL_TABLET | Freq: Three times a day (TID) | ORAL | Status: DC
  Administered 2021-11-08 – 2021-11-10 (×7): 1 mg
  Filled 2021-11-08 (×7): qty 1

## 2021-11-08 MED ORDER — GLYCOPYRROLATE 1 MG PO TABS
1.0000 mg | ORAL_TABLET | Freq: Three times a day (TID) | ORAL | 0 refills | Status: DC
  Filled 2021-11-08: qty 90, 30d supply, fill #0

## 2021-11-08 MED ORDER — METHADONE HCL 5 MG/5ML PO SOLN: 10.0000 mg | Freq: Two times a day (BID) | ORAL | 0 refills | Status: DC

## 2021-11-08 MED ORDER — DEXAMETHASONE 0.5 MG/5ML PO SOLN: 4.0000 mg | Freq: Every day | ORAL | 0 refills | Status: DC

## 2021-11-08 MED ORDER — DEXAMETHASONE 4 MG PO TABS
6.0000 mg | ORAL_TABLET | Freq: Every day | ORAL | Status: DC
  Administered 2021-11-09: 6 mg
  Filled 2021-11-08: qty 2

## 2021-11-08 MED ORDER — HYDROMORPHONE HCL 1 MG/ML PO LIQD: 4.0000 mg | ORAL | 0 refills | Status: DC | PRN

## 2021-11-08 MED ORDER — NALOXONE HCL 4 MG/0.1ML NA LIQD
1.0000 | Freq: Once | NASAL | 0 refills | Status: DC
  Filled 2021-11-08: qty 2, 2d supply, fill #0

## 2021-11-08 MED ORDER — SENNOSIDES 8.8 MG/5ML PO SYRP
10.0000 mL | ORAL_SOLUTION | Freq: Every day | ORAL | Status: DC
  Administered 2021-11-08 – 2021-11-09 (×2): 10 mL
  Filled 2021-11-08 (×2): qty 10

## 2021-11-08 MED ORDER — METHADONE HCL 5 MG/5ML PO SOLN
10.0000 mg | Freq: Two times a day (BID) | ORAL | 0 refills | Status: DC
Start: 2021-11-08 — End: 2021-11-08
  Filled 2021-11-08: qty 500, 20d supply, fill #0

## 2021-11-08 MED ORDER — HYDROMORPHONE HCL 1 MG/ML PO LIQD: 4.0000 mg | ORAL | Status: DC

## 2021-11-08 MED ORDER — CELECOXIB 200 MG PO CAPS
200.0000 mg | ORAL_CAPSULE | Freq: Two times a day (BID) | ORAL | Status: DC
  Administered 2021-11-08 – 2021-11-10 (×5): 200 mg
  Filled 2021-11-08 (×4): qty 1

## 2021-11-08 MED ORDER — METHADONE HCL 10 MG/ML PO CONC
10.0000 mg | Freq: Two times a day (BID) | ORAL | Status: DC
  Administered 2021-11-08 – 2021-11-10 (×5): 10 mg via ORAL
  Filled 2021-11-08 (×4): qty 5

## 2021-11-08 MED ORDER — BISACODYL 5 MG PO TBEC: 5.0000 mg | DELAYED_RELEASE_TABLET | Freq: Every day | ORAL | Status: DC

## 2021-11-08 MED ORDER — DEXAMETHASONE 0.5 MG/5ML PO SOLN
4.0000 mg | Freq: Every day | ORAL | 0 refills | Status: DC
  Filled 2021-11-08: qty 100, 2d supply, fill #0

## 2021-11-08 MED ORDER — NYSTATIN 100000 UNIT/ML MT SUSP
10.0000 mL | Freq: Four times a day (QID) | OROMUCOSAL | 5 refills | Status: DC
  Filled 2021-11-08: qty 600, 15d supply, fill #0

## 2021-11-08 MED ORDER — NALOXONE HCL 4 MG/0.1ML NA LIQD: 1.0000 | Freq: Once | NASAL | 0 refills | Status: DC

## 2021-11-08 MED ORDER — HYDROMORPHONE HCL 1 MG/ML PO LIQD
4.0000 mg | ORAL | Status: DC
  Administered 2021-11-08 – 2021-11-10 (×12): 4 mg
  Administered 2021-11-10: 3 mg
  Filled 2021-11-08 (×15): qty 5

## 2021-11-08 MED ORDER — MAGIC MOUTHWASH W/LIDOCAINE
15.0000 mL | ORAL | Status: DC | PRN
  Administered 2021-11-08 (×2): 15 mL via ORAL
  Administered 2021-11-09: 5 mL via ORAL
  Administered 2021-11-09 – 2021-11-10 (×2): 15 mL via ORAL
  Filled 2021-11-08 (×7): qty 15

## 2021-11-08 MED ORDER — SENNOSIDES 8.8 MG/5ML PO SYRP
10.0000 mL | ORAL_SOLUTION | Freq: Every day | ORAL | 0 refills | Status: DC
  Filled 2021-11-08: qty 240, 24d supply, fill #0

## 2021-11-08 MED ORDER — NYSTATIN 100000 UNIT/ML MT SUSP: 10.0000 mL | Freq: Four times a day (QID) | OROMUCOSAL | 5 refills | Status: DC

## 2021-11-08 MED ORDER — HYDROMORPHONE HCL 1 MG/ML PO LIQD
4.0000 mg | ORAL | 0 refills | Status: DC | PRN
  Filled 2021-11-08: qty 473, 13d supply, fill #0

## 2021-11-08 MED ORDER — SCOPOLAMINE 1 MG/3DAYS TD PT72
1.0000 | MEDICATED_PATCH | TRANSDERMAL | 2 refills | Status: DC
  Filled 2021-11-08: qty 10, 30d supply, fill #0

## 2021-11-08 MED ORDER — ALPRAZOLAM 0.5 MG PO TABS: 0.5000 mg | ORAL_TABLET | Freq: Three times a day (TID) | ORAL | 0 refills | Status: DC

## 2021-11-09 DIAGNOSIS — R52 Pain, unspecified: Secondary | ICD-10-CM | POA: Diagnosis not present

## 2021-11-09 MED ORDER — DEXAMETHASONE 4 MG PO TABS
4.0000 mg | ORAL_TABLET | Freq: Every day | ORAL | Status: DC
  Administered 2021-11-10: 4 mg
  Filled 2021-11-09: qty 1

## 2021-11-09 NOTE — Progress Notes (Signed)
PROGRESS NOTE RALPHAEL TRAME  AOZ:[id number] DOB: 04-09-1976 DOA: 11/03/2021 PCP: Patient, No Pcp Per   Brief Narrative/Hospital Course: 46 y.o. male with medical history significant of anxiety, asthma, depression, alcohol abuse, normocytic anemia, primary cancer of the tonsil, protein calorie malnutrition presented due to intractable throat cancer pain that is not responding to his usual home regimen. ED course: Initial vital signs with temperature 98.9 F, pulse 112, respiration 20, BP 136/ mmHg O2 sat 95% on room air.   Admitted to hospital due to significant neck pain.  Subjective:  Patient seen and examined.  No overnight events.  After various modification of liquid pain medications yesterday, he has not required to use injectable opiates since last 24 hours.  He is looking forward to go home when medications will be available.  Patient was able to swallow sips of water and wants to try some flavors and a small amount of juice.  Will allow ice chips and sips of water.  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  Multiple modifications on pain regimen with some relief today.   Currently on Methadone 10 mg twice daily, dexamethasone taper, NSAIDs, hydromorphone liquid solution and intermittently using IV hydromorphone. Actively followed by palliative for pain management. Completed radiation treatment. Much clinical improvement today, liquid formulations not ready at pharmacy. Patient will need high risk medications, liquid opiates, prescriptions were sent to pharmacy and need to ensure he has medications available before going  home.  Normocytic anemia: Hemoglobin is stable, monitor.  Protein calorie malnutrition moderate-tolerating tube feeding.  Also receiving supplements.  Febrile episode: Negative for infection work-up.  Improved.  DVT prophylaxis: SCDs Start: 11/03/21 1513 Code Status:   Code Status: Full Code Family Communication: None. Patient status is: Inpatient.  Improving.  Cannot go home unless he has medications available in the pharmacy.  Expect tomorrow. Level of care: Med-Surg   Dispo: The patient is from: home            Anticipated disposition: home once pain medications available in the pharmacy.  Mobility Assessment (last 72 hours)     Mobility Assessment     Row Name 11/08/21 2054 11/08/21 0812 11/07/21 2052 11/07/21 0800 11/06/21 2001   Does patient have an order for bedrest or is patient medically unstable No - Continue assessment No - Continue assessment No - Continue assessment No - Continue assessment No - Continue assessment   What is the highest level of mobility based on the progressive mobility assessment? Level 6 (Walks independently in room and hall) - Balance while walking in room without assist - Complete Level 6 (Walks independently in room and hall) - Balance while walking in room without assist - Complete Level 6 (Walks independently in room and hall) - Balance while walking in room without assist - Complete Level 6 (Walks independently in room and hall) - Balance while walking in room without assist - Complete Level 6 (Walks independently in room and hall) - Balance while walking in room without assist - Complete             Objective: Vitals last 24 hrs: Vitals:   11/07/21 2213 11/08/21 1416  11/08/21 2128 11/09/21 0551  BP: 123/89 117/76 117/74 (!) 125/91  Pulse: 99 91 96 (!) 105  Resp:  18 16 18   Temp: 97.8 F (36.6 C) 98.3 F (36.8 C) 98.1 F (36.7 C) 99.3 F (37.4 C)  TempSrc: Oral Oral Oral Oral  SpO2: 99% 94% 99% 95%   Weight change:   General:  Looks comfortable.  Walking around in the room. Cardiovascular: S1-S2 normal.  Regular rate rhythm.  Port-A-Cath present. Respiratory: Bilateral clear. Gastrointestinal: Soft.  Nontender.  PEG tube clean and dry. Ext: No edema.  No cyanosis. Neuro: Intact. Musculoskeletal: No deformities.  Patient has abnormal oropharynx with chronic induration.  No secretions today.         Medications reviewed:  Scheduled Meds:  ALPRAZolam  0.5 mg Oral TID   celecoxib  200 mg Per Tube BID   Chlorhexidine Gluconate Cloth  6 each Topical Daily   dexamethasone  6 mg Per Tube Daily   feeding supplement (KATE FARMS STANDARD 1.4)  487.5 mL Oral QID   glycopyrrolate  1 mg Per Tube TID   HYDROmorphone HCl  4 mg Per Tube Q4H   methadone  10 mg Oral Q12H   nystatin  5 mL Oral QID   sennosides  10 mL Per Tube QHS   sertraline  100 mg Per Tube q morning   Continuous Infusions:      Diet Order     None        Intake/Output Summary (Last 24 hours) at 11/09/2021 1057 Last data filed at 11/08/2021 1858 Gross per 24 hour  Intake 0 ml  Output --  Net 0 ml    Net IO Since Admission: 6,601.54 mL [11/09/21 1057]  Wt Readings from Last 3 Encounters:  10/30/21 73.4 kg  10/28/21 73.1 kg  10/21/21 72 kg     Unresulted Labs (From admission, onward)    None     Data Reviewed: I have personally reviewed following labs and imaging studies CBC: Recent Labs  Lab 11/03/21 0829 11/04/21 0500 11/05/21 1333 11/07/21 1016  WBC 2.5* 2.8* 6.8 3.9*  NEUTROABS 1.5*  --  4.7 2.6  HGB 8.6* 7.8* 7.6* 7.3*  HCT 24.8* 22.8* 22.0* 21.5*  MCV 93.6 95.4 94.0 96.4  PLT 204 174 183 219   Basic Metabolic Panel: Recent Labs  Lab 11/03/21 0829 11/04/21 0500 11/07/21 1016  NA 136 137 137  K 3.5 4.7 4.2  CL 99 104 100  CO2 28 27 29   GLUCOSE 99 102* 94  BUN 17 12 18   CREATININE 0.61 0.53* 0.68  CALCIUM 8.3* 8.5* 8.9   GFR: Estimated Creatinine Clearance: 122.3 mL/min (by C-G formula based on SCr  of 0.68 mg/dL). Liver Function Tests: Recent Labs  Lab 11/03/21 0829 11/04/21 0500 11/07/21 1016  AST 12* 13* 13*  ALT 15 14 13   ALKPHOS 44 44 43  BILITOT 0.4 0.3 0.1*  PROT 6.2* 6.2* 5.8*  ALBUMIN 3.4* 3.1* 3.0*   No results for input(s): "LIPASE", "AMYLASE" in the last 168 hours. No results for input(s): "AMMONIA" in the last 168 hours. Coagulation Profile: No results for input(s): "INR", "PROTIME" in the last 168 hours. BNP (last 3 results) No results for input(s): "PROBNP" in the last 8760 hours. HbA1C: No results for input(s): "HGBA1C" in the last 72 hours. CBG: No results for input(s): "GLUCAP" in the last 168 hours. Lipid Profile: No results for input(s): "CHOL", "HDL", "LDLCALC", "TRIG", "CHOLHDL", "LDLDIRECT" in the last 72 hours. Thyroid Function Tests:  No results for input(s): "TSH", "T4TOTAL", "FREET4", "T3FREE", "THYROIDAB" in the last 72 hours. Sepsis Labs: No results for input(s): "PROCALCITON", "LATICACIDVEN" in the last 168 hours.  Recent Results (from the past 240 hour(s))  Culture, blood (Routine X 2) w Reflex to ID Panel     Status: None (Preliminary result)   Collection Time: 11/05/21  1:31 PM   Specimen: BLOOD  Result Value Ref Range Status   Specimen Description   Final    BLOOD LEFT ANTECUBITAL Performed at [facility], [address] [place], [place] [address]    Special Requests   Final    BOTTLES DRAWN AEROBIC ONLY Blood Culture adequate volume Performed at [facility], [address] [place], [place] [address]    Culture   Final    NO GROWTH 4 DAYS Performed at [facility], [address] [place], [place] [address]    Report Status PENDING  Incomplete  Culture, blood (Routine X 2) w Reflex to ID Panel     Status: None (Preliminary result)   Collection Time: 11/05/21  1:31 PM   Specimen: BLOOD  Result Value Ref Range Status   Specimen Description   Final    BLOOD BLOOD LEFT  HAND Performed at [facility], [address] [place], [place] [address]    Special Requests   Final    BOTTLES DRAWN AEROBIC ONLY Blood Culture adequate volume Performed at [facility], [address], [place], [place] [address]    Culture   Final    NO GROWTH 4 DAYS Performed at [facility], [address], [place], [place] [address]    Report Status PENDING  Incomplete    Antimicrobials: Anti-infectives (From admission, onward)    None      Culture/Microbiology    Component Value Date/Time   SDES  11/05/2021 1331    BLOOD LEFT ANTECUBITAL Performed at [facility], [address], [place], [place] [address]    SDES  11/05/2021 1331    BLOOD BLOOD LEFT HAND Performed at [facility], [address] [place], [place] [address]    SPECREQUEST  11/05/2021 1331    BOTTLES DRAWN AEROBIC ONLY Blood Culture adequate volume Performed at [facility], [address] [place], [place] [address]    SPECREQUEST  11/05/2021 1331    BOTTLES DRAWN AEROBIC ONLY Blood Culture adequate volume Performed at [facility], [address] [place], [place] [address]    CULT  11/05/2021 1331    NO GROWTH 4 DAYS Performed at [facility], [address] [place], [place] [address]    CULT  11/05/2021 1331    NO GROWTH 4 DAYS Performed at [facility], [address] [place], [place] [address]    REPTSTATUS PENDING 11/05/2021 1331   REPTSTATUS PENDING 11/05/2021 1331    Other culture-see note  Radiology Studies: No results found.   LOS: 5 days   Total time spent: 35 minutes Dorcas Carrow, MD [facility]  11/09/2021, 10:57 AM

## 2021-11-10 ENCOUNTER — Encounter: Payer: Self-pay | Admitting: Internal Medicine

## 2021-11-10 ENCOUNTER — Other Ambulatory Visit (HOSPITAL_COMMUNITY): Payer: Self-pay

## 2021-11-10 ENCOUNTER — Other Ambulatory Visit: Payer: Self-pay | Admitting: Internal Medicine

## 2021-11-10 DIAGNOSIS — R52 Pain, unspecified: Secondary | ICD-10-CM | POA: Diagnosis not present

## 2021-11-10 LAB — CULTURE, BLOOD (ROUTINE X 2)
Culture: NO GROWTH
Culture: NO GROWTH
Special Requests: ADEQUATE
Special Requests: ADEQUATE

## 2021-11-10 MED ORDER — HEPARIN SOD (PORK) LOCK FLUSH 100 UNIT/ML IV SOLN
500.0000 [IU] | Freq: Once | INTRAVENOUS | Status: AC
  Administered 2021-11-10: 500 [IU] via INTRAVENOUS
  Filled 2021-11-10: qty 5

## 2021-11-10 MED ORDER — HYDROMORPHONE HCL 1 MG/ML PO LIQD: 4.0000 mg | ORAL | 0 refills | Status: AC

## 2021-11-10 MED ORDER — HYDROMORPHONE HCL 4 MG PO TABS
4.0000 mg | ORAL_TABLET | ORAL | 0 refills | Status: AC | PRN
  Filled 2021-11-10: qty 20, 4d supply, fill #0

## 2021-11-10 MED ORDER — METHADONE HCL 10 MG/ML PO CONC: 10.0000 mg | Freq: Two times a day (BID) | ORAL | 0 refills | Status: DC

## 2021-11-10 MED ORDER — DRONABINOL 5 MG PO CAPS: 5.0000 mg | ORAL_CAPSULE | Freq: Two times a day (BID) | ORAL | 0 refills | Status: DC

## 2021-11-10 MED ORDER — KATE FARMS STANDARD 1.4 PO LIQD: 487.5000 mL | Freq: Four times a day (QID) | ORAL | Status: AC

## 2021-11-10 MED ORDER — DEXAMETHASONE 4 MG PO TABS: 4.0000 mg | ORAL_TABLET | Freq: Every day | ORAL | Status: AC

## 2021-11-10 MED ORDER — CELECOXIB 200 MG PO CAPS: 200.0000 mg | ORAL_CAPSULE | Freq: Two times a day (BID) | ORAL | Status: AC

## 2021-11-10 MED ORDER — METHADONE HCL 10 MG/ML PO CONC
10.0000 mg | Freq: Two times a day (BID) | ORAL | 0 refills | Status: AC
  Filled 2021-11-10: qty 60, 30d supply, fill #0

## 2021-11-10 MED ORDER — ACETAMINOPHEN 325 MG PO TABS: 650.0000 mg | ORAL_TABLET | Freq: Four times a day (QID) | ORAL | Status: AC | PRN

## 2021-11-10 MED ORDER — METHADONE HCL 10 MG PO TABS
ORAL_TABLET | ORAL | 0 refills | Status: DC
  Filled 2021-11-10: qty 8, 4d supply, fill #0

## 2021-11-10 MED ORDER — ALPRAZOLAM 0.5 MG PO TABS: 0.5000 mg | ORAL_TABLET | Freq: Three times a day (TID) | ORAL | 0 refills | Status: AC | PRN

## 2021-11-10 MED ORDER — DRONABINOL 5 MG PO CAPS
5.0000 mg | ORAL_CAPSULE | Freq: Two times a day (BID) | ORAL | 0 refills | Status: DC
Start: 2021-11-10 — End: 2021-11-10
  Filled 2021-11-10: qty 20, 10d supply, fill #0

## 2021-11-11 ENCOUNTER — Other Ambulatory Visit: Payer: Self-pay | Admitting: Internal Medicine

## 2021-11-11 ENCOUNTER — Other Ambulatory Visit (HOSPITAL_COMMUNITY): Payer: Self-pay

## 2021-11-11 MED ORDER — DRONABINOL 5 MG PO CAPS: 5.0000 mg | ORAL_CAPSULE | Freq: Two times a day (BID) | ORAL | 0 refills | Status: DC

## 2021-11-11 MED ORDER — DRONABINOL 5 MG PO CAPS
5.0000 mg | ORAL_CAPSULE | Freq: Two times a day (BID) | ORAL | 0 refills | Status: AC
  Filled 2021-11-11: qty 20, 10d supply, fill #0

## 2021-11-14 ENCOUNTER — Other Ambulatory Visit: Payer: Self-pay

## 2021-11-14 ENCOUNTER — Inpatient Hospital Stay (HOSPITAL_BASED_OUTPATIENT_CLINIC_OR_DEPARTMENT_OTHER): Payer: Commercial Managed Care - PPO | Admitting: Nurse Practitioner

## 2021-11-14 ENCOUNTER — Other Ambulatory Visit (HOSPITAL_COMMUNITY): Payer: Self-pay

## 2021-11-14 ENCOUNTER — Encounter: Payer: Self-pay | Admitting: Nurse Practitioner

## 2021-11-14 VITALS — BP 119/99 | HR 105 | Temp 98.7°F | Resp 18

## 2021-11-14 DIAGNOSIS — Z515 Encounter for palliative care: Secondary | ICD-10-CM

## 2021-11-14 DIAGNOSIS — C113 Malignant neoplasm of anterior wall of nasopharynx: Secondary | ICD-10-CM | POA: Diagnosis not present

## 2021-11-14 DIAGNOSIS — Z87891 Personal history of nicotine dependence: Secondary | ICD-10-CM | POA: Diagnosis not present

## 2021-11-14 DIAGNOSIS — Z931 Gastrostomy status: Secondary | ICD-10-CM | POA: Diagnosis not present

## 2021-11-14 DIAGNOSIS — K5900 Constipation, unspecified: Secondary | ICD-10-CM | POA: Diagnosis not present

## 2021-11-14 DIAGNOSIS — F419 Anxiety disorder, unspecified: Secondary | ICD-10-CM | POA: Diagnosis not present

## 2021-11-14 DIAGNOSIS — C090 Malignant neoplasm of tonsillar fossa: Secondary | ICD-10-CM | POA: Diagnosis present

## 2021-11-14 DIAGNOSIS — R630 Anorexia: Secondary | ICD-10-CM

## 2021-11-14 DIAGNOSIS — D72819 Decreased white blood cell count, unspecified: Secondary | ICD-10-CM | POA: Diagnosis not present

## 2021-11-14 DIAGNOSIS — G893 Neoplasm related pain (acute) (chronic): Secondary | ICD-10-CM | POA: Diagnosis not present

## 2021-11-14 DIAGNOSIS — K1231 Oral mucositis (ulcerative) due to antineoplastic therapy: Secondary | ICD-10-CM | POA: Diagnosis not present

## 2021-11-14 DIAGNOSIS — R634 Abnormal weight loss: Secondary | ICD-10-CM | POA: Diagnosis not present

## 2021-11-14 DIAGNOSIS — Z800 Family history of malignant neoplasm of digestive organs: Secondary | ICD-10-CM | POA: Diagnosis not present

## 2021-11-14 DIAGNOSIS — Z5111 Encounter for antineoplastic chemotherapy: Secondary | ICD-10-CM | POA: Diagnosis present

## 2021-11-14 NOTE — Progress Notes (Signed)
[facility]  [phone] [phone]   Name: Johnny Mata Date: 11/14/2021 MRN: [id number]  DOB: Oct 29, 1975  Patient Care Team: Patient, No Pcp Per as PCP - General (General Practice) Atilano Ina, MD as Referring Physician (Neurosurgery) Malmfelt, Stephani Police, RN as Oncology Nurse Navigator Eppie Gibson, MD as Consulting Physician (Radiation Oncology) Benay Pike, MD as Consulting Physician (Hematology and Oncology)    INTERVAL HISTORY: Johnny Mata is a 46 y.o. 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 Palliative ask to see for symptom management.   SOCIAL HISTORY:     reports that he quit smoking about 2 years ago. His smoking use included cigarettes. He has a 30.00 pack-year smoking history. He has never used smokeless tobacco. He reports that he does not currently use alcohol after a past usage of about 7.0 standard drinks of alcohol per week. He reports current drug use. Drug: Marijuana.  ADVANCE DIRECTIVES:    CODE STATUS:   PAST MEDICAL HISTORY: Past Medical History:  Diagnosis Date   Anxiety    Never officially diagnosed; does not take medication for anxiety   Asthma    Hasn't had an episode since childhood   Cancer ([place])    Depression    Throat mass     ALLERGIES:  is allergic to dust mite extract, other, and pollen extract-tree extract [pollen extract].  MEDICATIONS:  Current Outpatient Medications  Medication Sig Dispense Refill   acetaminophen (TYLENOL) 325 MG tablet Place 2 tablets (650 mg total) into feeding tube every 6 (six) hours as needed for fever.     ALPRAZolam (XANAX) 0.5 MG tablet Take 1 tablet (0.5 mg total) by mouth 3 (three) times daily as needed for sleep or anxiety. 30 tablet 0   celecoxib (CELEBREX) 200 MG capsule Place 1 capsule (200 mg total)  into feeding tube 2 (two) times daily.     dexamethasone (DECADRON) 4 MG tablet Place 1 tablet (4 mg total) into feeding tube daily.     dronabinol (MARINOL) 5 MG capsule Take 1 capsule (5 mg total) by mouth 2 (two) times daily before lunch and supper. 20 capsule 0   HYDROmorphone (DILAUDID) 4 MG tablet Crush and dissolve 1 tablet with small amount of water and administer via PEG Tube every 4 hours as needed for severe pain 20 tablet 0   HYDROmorphone HCl (DILAUDID) 1 MG/ML LIQD Place 4 mLs (4 mg total) into feeding tube every 4 (four) hours. 56 mL 0   methadone (DOLOPHINE) 10 MG tablet Crush and dissolve 1 tablet in water and administer via PEG tube 2 times a day 8 tablet 0   methadone (DOLOPHINE) 10 MG/ML solution Place 1 mL (10 mg total) into feeding tube every 12 (twelve) hours. 60 mL 0   Nutritional Supplements (FEEDING SUPPLEMENT, KATE FARMS STANDARD 1.4,) LIQD liquid Take 488 mLs by mouth 4 (four) times daily.     Nutritional Supplements (KATE FARMS STANDARD 1.4) LIQD 6 cartons via tube split over four feedings. Flush tube with 90 ml water before and after feedings. Drink by mouth or give via tube additional 2 cups water. This provides 2730 kcal, 120 grams protein, 1404 ml free water (2598 ml total water with flushes). Meets 100% needs  (Patient taking differently: by Per J Tube route See admin instructions. 6 cartons via tube split over four feedings. Flush tube with  90 ml water before and after feedings. Drink by mouth or give via tube additional 2 cups water. This provides 2730 kcal, 120 grams protein, 1404 ml free water (2598 ml total water with flushes). Meets 100% needs )     prochlorperazine (COMPAZINE) 10 MG tablet Take 1 tablet (10 mg total) by mouth every 6 (six) hours as needed (Nausea or vomiting). (Patient taking differently: Place 10 mg into feeding tube every 4 (four) hours as needed for nausea or vomiting (with each dose of hydrocodone/apap).) 30 tablet 1   sertraline (ZOLOFT) 100 MG  tablet Place 100 mg into feeding tube every morning.     No current facility-administered medications for this visit.    VITAL SIGNS: BP (!) 119/99 (BP Location: Left Arm, Patient Position: Sitting) Comment: NP aware  Pulse (!) 105   Temp 98.7 F (37.1 C) (Oral)   Resp 18   SpO2 96%  There were no vitals filed for this visit.  Estimated body mass index is 21.84 kg/m as calculated from the following:   Height as of 11/10/21: 6' (1.829 m).   Weight as of 11/10/21: 73 kg.   PERFORMANCE STATUS (ECOG) : 1 - Symptomatic but completely ambulatory   Physical Exam General: NAD, ambulatory  Cardiovascular: regular rate and rhythm Pulmonary: normal breathing pattern  Extremities: no edema, no joint deformities Skin: no rashes Neurological: AAO x3  IMPRESSION: Mr. Bais presents to clinic today for symptom management follow-up post recent hospitalization for uncontrolled pain.   No acute distress noted. Ambulatory without devices. Denies nausea, vomiting. Ongoing throat pain. Reports he is tolerating tube feedings without difficulty. Has to spread them out at times.   Neoplasm related pain  Carman reports some relief on current regimen however pain is ongoing. Is hopeful for better control allowing for improvement in his quality of life. He would like to be able to go on vacation with his parents in August.   We discussed his current regimen at length. He is crushing medications as appropriate and administering via tube. He is taking all medications as prescribed. Denies drowsiness.   Current regimen: Methadone '10mg'$  twice daily, dexamethasone '4mg'$  daily, dilaudid '4mg'$  every 4 hours as needed for breakthrough, Celebrex '200mg'$  twice daily  Although his pain is improved since hospitalization he is still unable to have comfort for most of the day. Unable to rest due to pain. We discussed adjustments with hopes of continued improvement. He verbalized understanding and appreciation.    Constipation  Denies concerns. Discussed importance of bowel regimen in the setting of opioid use.   I discussed the importance of continued conversation with family and their medical providers regarding overall plan of care and treatment options, ensuring decisions are within the context of the patients values and GOCs.  PLAN: Increase methadone to '10mg'$  three times daily Hydromorphone '6mg'$  as needed every 4-6 hours Celebrex 200 mg twice daily Bowel regimen as needed in setting of opioid use. Denies concerns with regularity with tube feedings Ongoing goals of care and support I will plan to see patient back in 2-3 weeks in collaboration with his other oncology appointments.    Patient expressed understanding and was in agreement with this plan. He also understands that He can call the clinic at any time with any questions, concerns, or complaints.         Any controlled substances utilized were prescribed in the context of palliative care. PDMP has been reviewed.      Time Total: 40 min  Visit consisted of counseling and education dealing with the complex and emotionally intense issues of symptom management and palliative care in the setting of serious and potentially life-threatening illness.Greater than 50%  of this time was spent counseling and coordinating care related to the above assessment and plan.  Alda Lea, AGPCNP-BC  Palliative Medicine Team/[facility]

## 2021-11-19 ENCOUNTER — Ambulatory Visit: Payer: Commercial Managed Care - PPO | Attending: Radiation Oncology | Admitting: Physical Therapy

## 2021-11-19 ENCOUNTER — Ambulatory Visit: Payer: Commercial Managed Care - PPO | Attending: Radiation Oncology

## 2021-11-19 DIAGNOSIS — R293 Abnormal posture: Secondary | ICD-10-CM | POA: Insufficient documentation

## 2021-11-19 DIAGNOSIS — C090 Malignant neoplasm of tonsillar fossa: Secondary | ICD-10-CM | POA: Insufficient documentation

## 2021-11-21 ENCOUNTER — Telehealth: Payer: Self-pay | Admitting: *Deleted

## 2021-11-21 ENCOUNTER — Ambulatory Visit: Payer: Commercial Managed Care - PPO | Admitting: Radiation Oncology

## 2021-11-21 ENCOUNTER — Inpatient Hospital Stay: Payer: Commercial Managed Care - PPO | Attending: Hematology and Oncology | Admitting: Dietician

## 2021-11-21 NOTE — Telephone Encounter (Signed)
CALLED PATIENT TO INQUIRE IF HE IS COMING TODAY FOR FU, LVM FOR A RETURN CALL

## 2021-11-21 NOTE — Progress Notes (Signed)
Patient did not show for scheduled appointments today. Scheduling unable to reach patient via telephone. Left VM with request for return call. Will reschedule nutrition appointment as able.

## 2021-11-26 ENCOUNTER — Inpatient Hospital Stay: Payer: Commercial Managed Care - PPO | Admitting: Hematology and Oncology

## 2021-11-26 ENCOUNTER — Inpatient Hospital Stay: Payer: Commercial Managed Care - PPO | Admitting: Nurse Practitioner

## 2021-11-26 NOTE — Progress Notes (Deleted)
Johnny Mata  PROGRESS NOTE  Patient Care Team: Patient, No Pcp Per as PCP - General (General Practice) Powers, Sheppard Coil, MD as Referring Physician (Neurosurgery) Malmfelt, Stephani Police, RN as Oncology Nurse Navigator Eppie Gibson, MD as Consulting Physician (Radiation Oncology) Benay Pike, MD as Consulting Physician (Hematology and Oncology)  CHIEF COMPLAINTS/PURPOSE OF CONSULTATION:  SCC oropharynx  Oncology History  Primary cancer of tonsillar fossa ([place])  08/19/2021 Imaging   Patient complained of spitting chunks of tissue hence had a CT maxillofacial with contrast as well as with the neck with contrast.  Mucosal hyperenhancement in the right nasopharynx extending to the oropharynx with mostly soft tissue density in the region of right palatine tonsil most suspicious for malignancy.  There is also a 1.8 x 1.5 x 2.4 cm peripherally enhancing hyperintense lesion in the right retropharynx suspicious for necrotic lymph node with additional prominent right level 2 lymph nodes measuring up to 1.3 cm in short axis with suspected extracapsular extension at level to me.   08/27/2021 Initial Diagnosis   Primary cancer of tonsillar fossa ([place])   09/01/2021 PET scan   PET scan from April 17 showed locally advanced right palatine tonsil primary with ipsilateral cervical nodal metastasis no evidence of hypermetabolic extracervical metastasis.  Tiny pulmonary nodules below PET resolution consider chest CT follow-up at 6 months   09/03/2021 Cancer Staging   Staging form: Pharynx - P16 Negative Oropharynx, AJCC 8th Edition - Clinical stage from 09/03/2021: Stage IVA (cT3, cN2b, cM0, p16-) - Signed by Eppie Gibson, MD on 09/03/2021 Stage prefix: Initial diagnosis   09/17/2021 -  Chemotherapy   Patient is on Treatment Plan : HEAD/NECK Cisplatin q7d      INTERIM HISTORY: Johnny Mata is returns today for a follow up visit prior to cycle 7 of cisplatin. He is unaccompanied for this visit.  He is struggling with persistent pain in his mouth, not improved with Vidocin. He continues to have excessive secretions which causes him to choke/regurgitate. This prevents him from sleeping. He is compliant with wearing scopolamine patch. He has mucositis but symptoms are improved with lidocaine mouthwash. He denies nausea, vomiting. His bowel habits are unchanged with a BM every 2 days. He denies easy bruising or signs of bleeding. He reports intermittent episodes of fevers, with Tmax of 100 F yesterday. He denies fevers today. He denies  His continues to have tinnitus which was present before chemotherapy and hasnt gotten worse. He has no other complaints.   Rest of the pertinent 10 point ROS reviewed and negative  MEDICAL HISTORY:  Past Medical History:  Diagnosis Date   Anxiety    Never officially diagnosed; does not take medication for anxiety   Asthma    Hasn't had an episode since childhood   Cancer ([place])    Depression    Throat mass     SURGICAL HISTORY: Past Surgical History:  Procedure Laterality Date   ELBOW SURGERY Bilateral    HIP SURGERY Left    IR GASTROSTOMY TUBE MOD SED  09/11/2021   IR IMAGING GUIDED PORT INSERTION  09/11/2021   NECK SURGERY  05/09/2018   Cervical fusion (Dr. Atilano Ina)   PANENDOSCOPY N/A 08/19/2021   Procedure: PANENDOSCOPY WITH BIPOSY--direct laryngoscopy, flexible bronchoscopy, rigid esophagoscopy, biopsy of lesion in throat;  Surgeon: Marcina Millard, MD;  Location: [facility];  Service: ENT;  Laterality: N/A;   PLANTAR FASCIA SURGERY     SHOULDER SURGERY Left    TONSILLECTOMY     WISDOM  TOOTH EXTRACTION Bilateral 1995    SOCIAL HISTORY: Social History   Socioeconomic History   Marital status: Single    Spouse name: Not on file   Number of children: Not on file   Years of education: Not on file   Highest education level: Not on file  Occupational History   Not on file  Tobacco Use   Smoking status: Former    Packs/day: 1.50     Years: 20.00    Total pack years: 30.00    Types: Cigarettes    Quit date: 10/02/2019    Years since quitting: 2.1   Smokeless tobacco: Never  Vaping Use   Vaping Use: Never used  Substance and Sexual Activity   Alcohol use: Not Currently    Alcohol/week: 7.0 standard drinks of alcohol    Types: 7 Shots of liquor per week   Drug use: Yes    Types: Marijuana   Sexual activity: Not Currently  Other Topics Concern   Not on file  Social History Narrative   Not on file   Social Determinants of Health   Financial Resource Strain: Medium Risk (09/12/2021)   Overall Financial Resource Strain (CARDIA)    Difficulty of Paying Living Expenses: Somewhat hard  Food Insecurity: Not on file  Transportation Needs: No Transportation Needs (09/12/2021)   PRAPARE - Transportation    Lack of Transportation (Medical): No    Lack of Transportation (Non-Medical): No  Physical Activity: Not on file  Stress: Not on file  Social Connections: Not on file  Intimate Partner Violence: Not on file    FAMILY HISTORY: Family History  Problem Relation Age of Onset   Colon cancer Mother     ALLERGIES:  is allergic to dust mite extract, other, and pollen extract-tree extract [pollen extract].  MEDICATIONS:  Current Outpatient Medications  Medication Sig Dispense Refill   acetaminophen (TYLENOL) 325 MG tablet Place 2 tablets (650 mg total) into feeding tube every 6 (six) hours as needed for fever.     ALPRAZolam (XANAX) 0.5 MG tablet Take 1 tablet (0.5 mg total) by mouth 3 (three) times daily as needed for sleep or anxiety. 30 tablet 0   celecoxib (CELEBREX) 200 MG capsule Place 1 capsule (200 mg total) into feeding tube 2 (two) times daily.     dexamethasone (DECADRON) 4 MG tablet Place 1 tablet (4 mg total) into feeding tube daily.     dronabinol (MARINOL) 5 MG capsule Take 1 capsule (5 mg total) by mouth 2 (two) times daily before lunch and supper. 20 capsule 0   HYDROmorphone (DILAUDID) 4 MG  tablet Crush and dissolve 1 tablet with small amount of water and administer via PEG Tube every 4 hours as needed for severe pain 20 tablet 0   HYDROmorphone HCl (DILAUDID) 1 MG/ML LIQD Place 4 mLs (4 mg total) into feeding tube every 4 (four) hours. 56 mL 0   methadone (DOLOPHINE) 10 MG/ML solution Place 1 mL (10 mg total) into feeding tube every 12 (twelve) hours. 60 mL 0   Nutritional Supplements (FEEDING SUPPLEMENT, KATE FARMS STANDARD 1.4,) LIQD liquid Take 488 mLs by mouth 4 (four) times daily.     Nutritional Supplements (KATE FARMS STANDARD 1.4) LIQD 6 cartons via tube split over four feedings. Flush tube with 90 ml water before and after feedings. Drink by mouth or give via tube additional 2 cups water. This provides 2730 kcal, 120 grams protein, 1404 ml free water (2598 ml total water with flushes). Meets  100% needs  (Patient taking differently: by Per J Tube route See admin instructions. 6 cartons via tube split over four feedings. Flush tube with 90 ml water before and after feedings. Drink by mouth or give via tube additional 2 cups water. This provides 2730 kcal, 120 grams protein, 1404 ml free water (2598 ml total water with flushes). Meets 100% needs )     prochlorperazine (COMPAZINE) 10 MG tablet Take 1 tablet (10 mg total) by mouth every 6 (six) hours as needed (Nausea or vomiting). (Patient taking differently: Place 10 mg into feeding tube every 4 (four) hours as needed for nausea or vomiting (with each dose of hydrocodone/apap).) 30 tablet 1   sertraline (ZOLOFT) 100 MG tablet Place 100 mg into feeding tube every morning.     No current facility-administered medications for this visit.     PHYSICAL EXAMINATION: ECOG PERFORMANCE STATUS: 0 - Asymptomatic  There were no vitals filed for this visit.     There were no vitals filed for this visit.  GENERAL:alert, no distress and comfortable Mouth:Mucositis posterior oropharynx Skin: Radiation changes noted. Chest: Clear to  auscultation bilaterally, rate rhythm regular Lower extremities: No lower extremity edema   LABORATORY DATA:  I have reviewed the data as listed Lab Results  Component Value Date   WBC 3.9 (L) 11/07/2021   HGB 7.3 (L) 11/07/2021   HCT 21.5 (L) 11/07/2021   MCV 96.4 11/07/2021   PLT 219 11/07/2021     Chemistry      Component Value Date/Time   NA 137 11/07/2021 1016   K 4.2 11/07/2021 1016   CL 100 11/07/2021 1016   CO2 29 11/07/2021 1016   BUN 18 11/07/2021 1016   CREATININE 0.68 11/07/2021 1016   CREATININE 0.74 10/28/2021 1426      Component Value Date/Time   CALCIUM 8.9 11/07/2021 1016   ALKPHOS 43 11/07/2021 1016   AST 13 (L) 11/07/2021 1016   ALT 13 11/07/2021 1016   BILITOT 0.1 (L) 11/07/2021 1016       RADIOGRAPHIC STUDIES: I have personally reviewed the radiological images as listed and agreed with the findings in the report. CT SOFT TISSUE NECK W CONTRAST  Result Date: 11/05/2021 CLINICAL DATA:  Cancer of nasopharynx and tonsillar fossa. Fever. Worsening swelling and dysphagia. EXAM: CT NECK WITH CONTRAST TECHNIQUE: Multidetector CT imaging of the neck was performed using the standard protocol following the bolus administration of intravenous contrast. RADIATION DOSE REDUCTION: This exam was performed according to the departmental dose-optimization program which includes automated exposure control, adjustment of the mA and/or kV according to patient size and/or use of iterative reconstruction technique. CONTRAST:  169m OMNIPAQUE IOHEXOL 300 MG/ML  SOLN COMPARISON:  CT neck 08/19/2021 FINDINGS: Pharynx and larynx: Diffuse mucosal edema in the pharynx likely due to interval radiation. Masslike swelling in the right tonsil and right lateral oropharynx has improved due to treated tumor. Epiglottis and larynx normal. Airway intact. Salivary glands: No inflammation, mass, or stone. Thyroid: Negative Lymph nodes: Interval improvement in right retropharyngeal lymph node.  Improvement in right-sided lymph nodes in the neck due to treated tumor. No malignant nodes are present on today's study. Vascular: Normal vascular enhancement. Port-A-Cath right jugular vein. Limited intracranial: Negative Visualized orbits: Negative Mastoids and visualized paranasal sinuses: Mild mucosal edema paranasal sinuses. Mastoid clear bilaterally Skeleton: Cervical spondylosis. ACDF C4 through C6. No acute skeletal abnormality. Upper chest: Lung apices clear bilaterally Other: None IMPRESSION: 1. No evidence of abscess or acute infection in the  neck 2. Significant improvement in tumor in the right tonsil and oropharynx. Improvement in retropharyngeal node on the right. Improvement in right-sided lymph nodes. Electronically Signed   By: Franchot Gallo M.D.   On: 11/05/2021 17:39   DG CHEST PORT 1 VIEW  Result Date: 11/05/2021 CLINICAL DATA:  Nasopharyngeal cancer, intractable pain, asthma, malnutrition EXAM: PORTABLE CHEST 1 VIEW COMPARISON:  Portable exam 1307 hours without priors for comparison FINDINGS: RIGHT jugular Port-A-Cath with tip projecting over SVC. Normal heart size, mediastinal contours, and pulmonary vascularity. Question 7 mm RIGHT midlung nodule. Lungs otherwise clear. No infiltrate, pleural effusion, or pneumothorax. No acute osseous findings. IMPRESSION: Question 7 mm RIGHT upper lobe nodule; CT chest recommended for further evaluation. Electronically Signed   By: Lavonia Dana M.D.   On: 11/05/2021 13:48    All questions were answered. The patient knows to call the clinic with any problems, questions or concerns   ASSESSMENT & PLAN:  Johnny Mata returns for a follow up for squamous cell carcinoma of the oropharynx.   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 -- He was admitted from 6 19-6 26 for severe pain and was managed in the hospital, now on multiple medications for  pain regimen and followed by palliative care.  He is otherwise recovering well from the treatment.  Anticipate end of treatment PET scan about 3 months after completion of treatment, he will follow-up with Dr. Isidore Moos to review imaging.  Return to clinic with medical oncology in about 6 months  #Cancer related pain: --Currently crushing two Vicodin tablets via G-tube every 6 hours. Pain is not well controlled so added fentanyl 12.5 mcg patch.   #Mucositis:  --Secondary to radiation --Currently using lidocaine mouthwash, sent refill  #Excessive secretions: --Currently on scopolamine patch with persistent symptoms.  --sent atropine drops.   Follow up: -- I have spent a total of 30 minutes minutes of face-to-face and non-face-to-face time, preparing to see the patient, performing a medically appropriate examination, counseling and educating the patient, ordering medications/tests,  documenting clinical information in the electronic health record, and care coordination.   Benay Pike MD

## 2021-12-08 NOTE — Therapy (Signed)
[facility] [address], [place], [place], [address] Phone: [phone]   Fax:  [phone]  Patient Details  Name: BURCH MARCHUK MRN: [id number] Date of Birth: 1975-06-24 Referring Provider:  Eppie Gibson, MD  Encounter Date: 12/08/2021   SPEECH THERAPY DISCHARGE SUMMARY  Visits from Start of Care: 2  Current functional level related to goals / functional outcomes: Pertienent sections of pt's last therapy note are included below:  TODAY'S TREATMENT:  10-16-21 Pt reports dysgeusia is the major reason he is not having more solid PO. Rarely but especially with meds pt reported nasal regurgitation with thin liquid. SLP explained to pt why this might be happening and when pt took smaller sips and swallowed with effort he reported feeling like he would NOT experience nasal regurgitation. Pt did not have med nor SLP had pseudo-med to try this with but pt again stated he felt like he would not regurgitate with smaller sips and effortful swallow. Sky did not want to have any applesauce due to dysgeusia. With HEP, pt also politely refused (attempted but did not feel he could perform) [place]. He req'd consistent min A on the other exercises; Pt was not performing scope of HEP as directed. SLP reiterated that he should do at least 20 reps/day each exercise, at least 5 reps at a time. When pain in tongue subsides/reduces he should aim for 30 reps/day/each with at least 5 reps at a time. Pt told SLP 3 overt s/sx aspiration PNA with modified independence, and told SLP rationale fo HEP independently.CLINICAL IMPRESSION: Patient is a 46 y.o. male who was seen today for assessment of swallowing as they undergo radiation/chemoradiation therapy. Today pt drank thin liquids using strong swallow and smaller sips - said this felt more normal and didn't have the same feeling in posterior oral cavity/nasopharynx that he did when he experienced nasal regurgitation. Pt  politely refused applesauce due to dysgeusia. At this time pt swallowing is deemed [facility] with thin liquids using strategies above. No oral or overt s/sx pharyngeal deficits, including aspiration were observed. There are no overt s/s aspiration PNA observed by SLP nor any reported by pt at this time. Data indicate that pt's swallow ability will likely decrease over the course of radiation/chemoradiation therapy and could very well decline over time following the conclusion of that therapy due to muscle disuse atrophy and/or muscle fibrosis. Completion of HEP was ultimately done with modified independence. Pt will cont to need to be seen by SLP in order to assess safety of PO intake, assess the need for recommending any objective swallow assessment, and ensuring pt is correctly completing the individualized HEP.   OBJECTIVE IMPAIRMENTS include dysphagia. These impairments are limiting patient from safety when swallowing. Factors affecting potential to achieve goals and functional outcome are (possibly) cooperation/participation level, and pain level. Patient will benefit from skilled SLP services to address above impairments and improve overall function.   REHAB POTENTIAL: Good     GOALS: SHORT TERM GOALS: Target date: 10-24-21   pt will complete HEP with rare min A Baseline: Goal status: Not met   2.  pt will tell SLP why pt is completing HEP with modified independence   Baseline:  Goal status: Met   3.  pt will describe 3 overt s/s aspiration PNA with modified independence   Baseline:  Goal status: Met     LONG TERM GOALS: Target date: 12-17-21   pt will tell SLP how a food journal could  hasten return to a more normalized diet Baseline:  Goal status: Ongoing   2.  pt will complete HEP with modified independence over 2 visits Baseline:  Goal status: Ongoing   3.  pt will describe how to modify HEP over time, and the timeline associated with reduction in HEP frequency with modified  independence over 2 sessions Baseline:  Goal status: Ongoing       PLAN: SLP FREQUENCY: once approx every 4 weeks   SLP DURATION: 90 days   PLANNED INTERVENTIONS: Aspiration precaution training, Pharyngeal strengthening exercises, Diet toleration management , Trials of upgraded texture/liquids, Internal/external aids, SLP instruction and feedback, Compensatory strategies, and Patient/family education    Remaining deficits: Deficits assumed to have remained.   Education / Equipment:    Patient agrees to discharge. Patient goals were partially met. Patient is being discharged due to  pt is deceased.Marland Kitchen     [facility], [place] 12/08/2021, 8:42 AM  [facility] [address], [place], [place], [address] Phone: [phone]   Fax:  [phone]

## 2021-12-12 ENCOUNTER — Telehealth: Payer: Self-pay | Admitting: Hematology and Oncology

## 2021-12-12 NOTE — Telephone Encounter (Signed)
disregard

## 2021-12-16 DEATH — deceased

## 2022-01-02 ENCOUNTER — Encounter: Payer: Self-pay | Admitting: Hematology and Oncology

## 2022-01-02 NOTE — Progress Notes (Signed)
                                                                                                                                                             Patient Name: [name] MRN: [id number] DOB: Apr 06, 1976 Referring Physician: Marcina Millard Date of Service: 11/07/2021 [facility], [place]                                                        End Of Treatment Note  Diagnoses: C10.8-Malignant neoplasm of overlapping sites of oropharynx  Cancer Staging:  Cancer Staging  Primary cancer of tonsillar fossa ([place]) Staging form: Pharynx - P16 Negative Oropharynx, AJCC 8th Edition - Clinical stage from 09/03/2021: Stage IVA (cT3, cN2b, cM0, p16-) - Signed by Eppie Gibson, MD on 09/03/2021 Stage prefix: Initial diagnosis  Intent: Curative  Radiation Treatment Dates: 09/17/2021 through 11/07/2021 Site Technique Total Dose (Gy) Dose per Fx (Gy) Completed Fx Beam Energies  Oropharynx: HN_nas_oro IMRT 70/70 2 35/35 6X   Narrative: The patient tolerated radiation therapy with expected acute side effects.   Plan: The patient will follow-up with radiation oncology in 2-3 weeks.  -----------------------------------  Eppie Gibson, MD

## 2022-01-06 ENCOUNTER — Ambulatory Visit: Payer: Commercial Managed Care - PPO | Admitting: Hematology and Oncology

## 2023-10-15 IMAGING — US IR IMAGING GUIDED PORT INSERTION
1 series · 3 of 3 positions shown · non-contrast
Comparison: none

INDICATION: 45-year-old male with a history head and neck carcinoma referred for
port catheter and percutaneous gastrostomy

[Series 1: ir fluoro/shunt/fist · 3 of 3 slices shown]
[im 1/3]
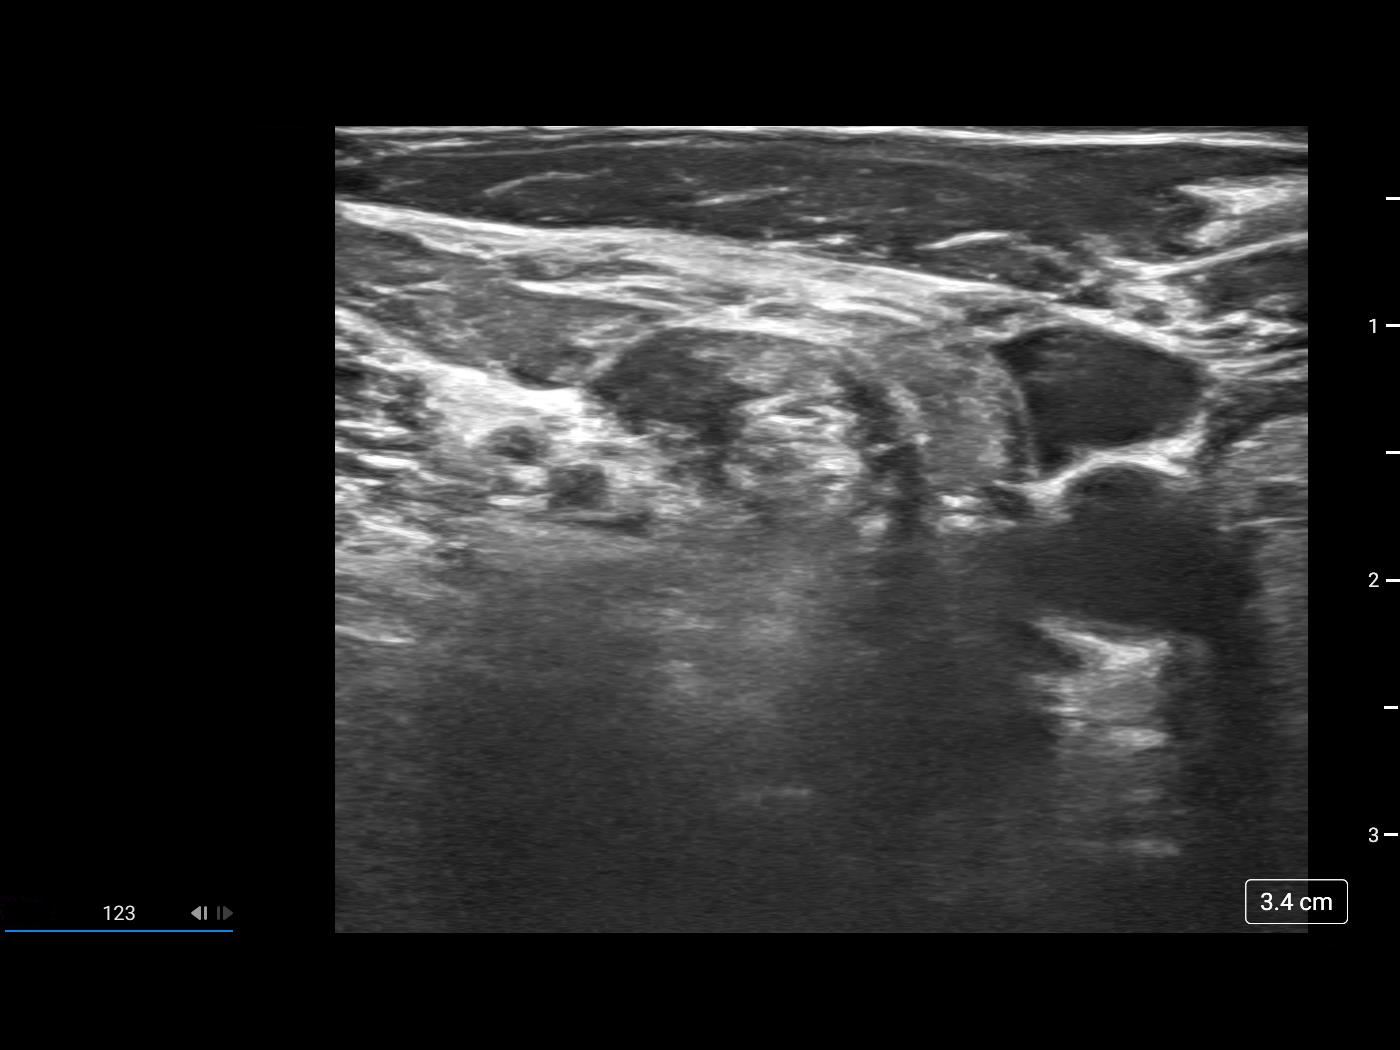
[im 2/3]
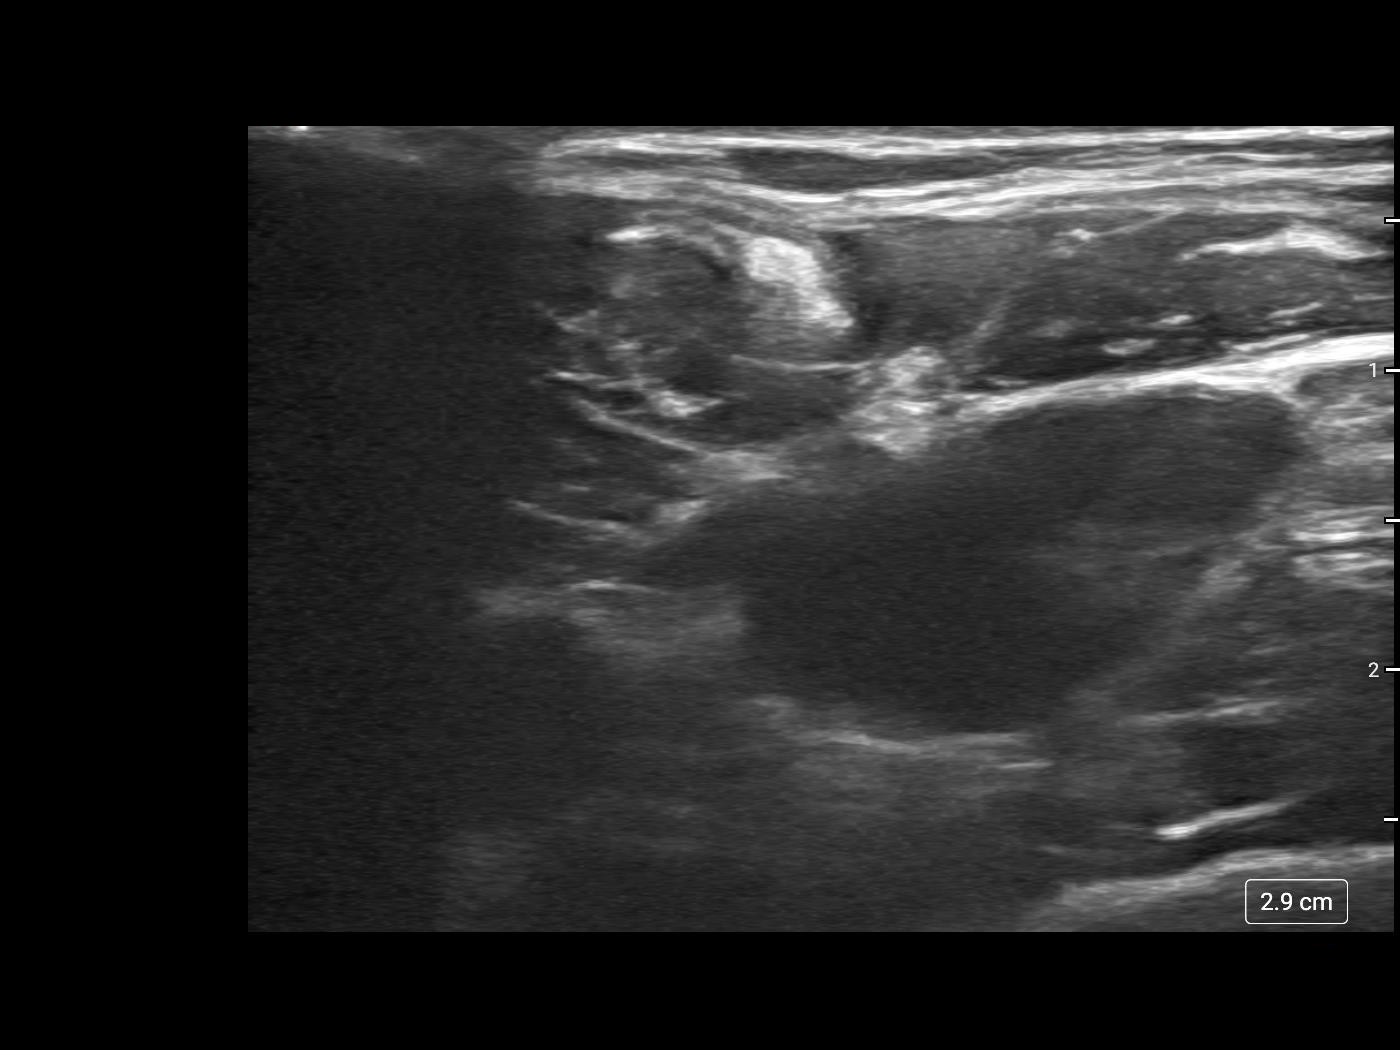
[im 3/3]
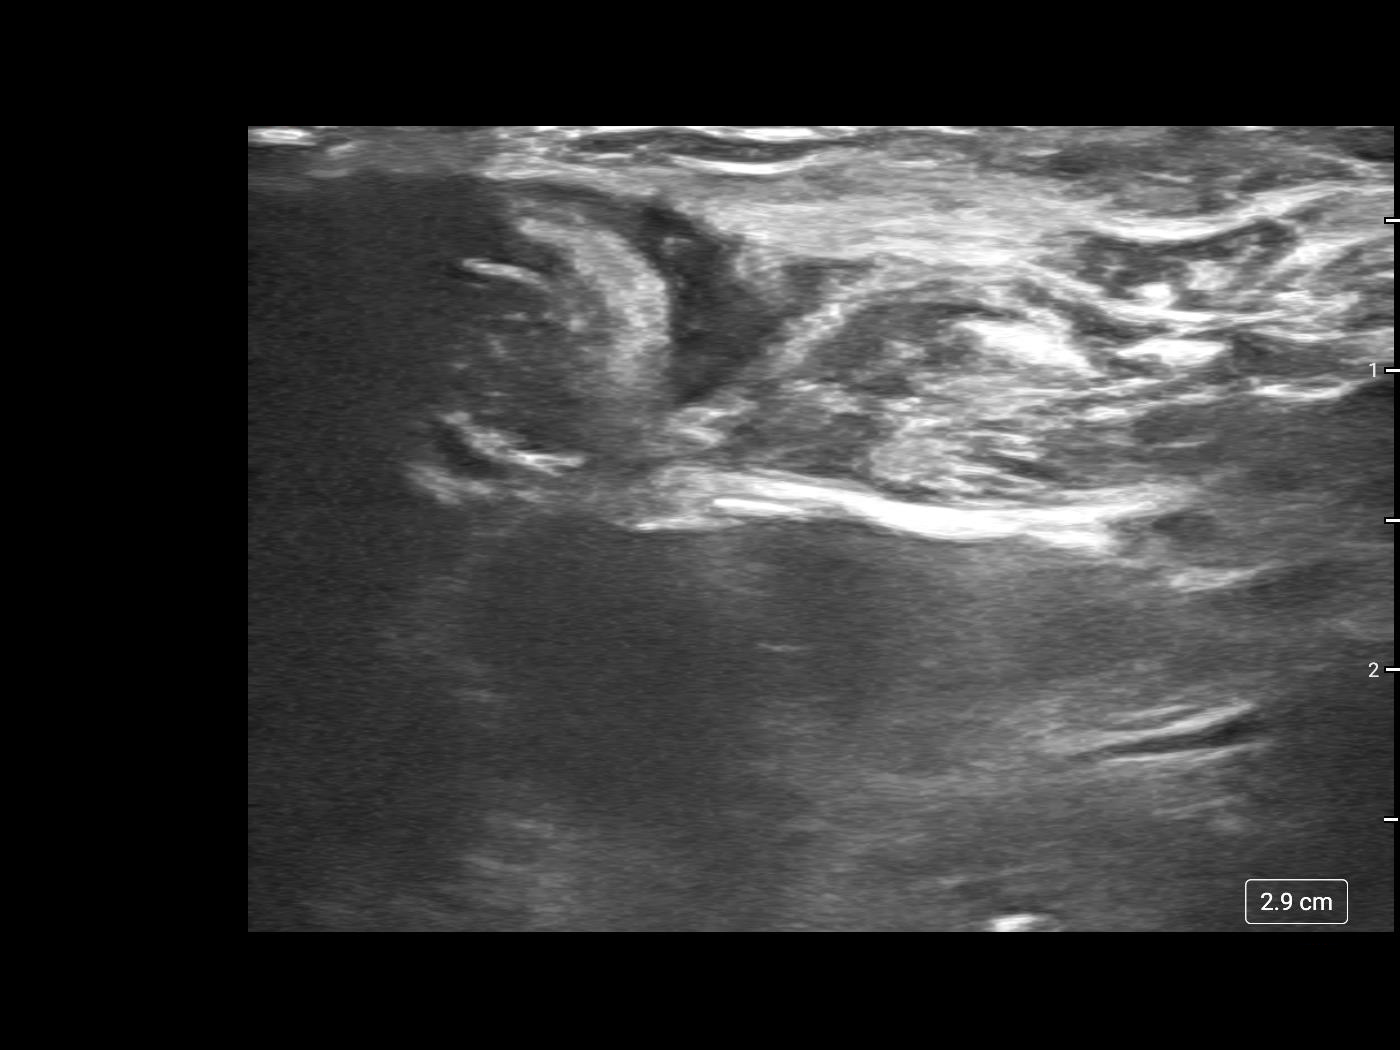

[3 of 3 positions shown; findings below may reference images not displayed]

EXAM:
IMAGE GUIDED PORT CATHETER

IMAGE GUIDED PERCUTANEOUS GASTROSTOMY

MEDICATIONS:
2 g Ancef; The antibiotic was administered within an appropriate
time interval prior to skin puncture.

ANESTHESIA/SEDATION:
Moderate (conscious) sedation was employed during this procedure. A
total of Versed 5.0 mg and Fentanyl 200 mcg was administered
intravenously.

Moderate Sedation Time: 47 minutes. The patient's level of
consciousness and vital signs were monitored continuously by
radiology nursing throughout the procedure under my direct
supervision.

FLUOROSCOPY TIME:  Fluoroscopy Time: 3 minutes 36 seconds (16 mGy).

COMPLICATIONS:
None

PROCEDURE:
Informed written consent was obtained from the patient after a
thorough discussion of the procedural risks, benefits and
alternatives. All questions were addressed. Maximal Sterile Barrier
Technique was utilized including caps, mask, sterile gowns, sterile
gloves, sterile drape, hand hygiene and skin antiseptic. A timeout
was performed prior to the initiation of the procedure.

Ultrasound survey was performed with images stored and sent to PACs.
Right IJ vein documented to be patent.

The right neck and chest was prepped with chlorhexidine, and draped
in the usual sterile fashion using maximum barrier technique (cap
and mask, sterile gown, sterile gloves, large sterile sheet, hand
hygiene and cutaneous antiseptic). Local anesthesia was attained by
infiltration with 1% lidocaine without epinephrine.

Ultrasound demonstrated patency of the right internal jugular vein,
and this was documented with an image. Under real-time ultrasound
guidance, this vein was accessed with a 21 gauge micropuncture
needle and image documentation was performed. A small dermatotomy
was made at the access site with an 11 scalpel. A 0.018" wire was
advanced into the SVC and used to estimate the length of the
internal catheter. The access needle exchanged for a 4F
micropuncture vascular sheath. The 0.018" wire was then removed and
a 0.035" wire advanced into the IVC.



The venous access site was then serially dilated and a peel away
vascular sheath placed over the wire. The wire was removed and the
port catheter advanced into position under fluoroscopic guidance.
The catheter tip is positioned in the cavoatrial junction. This was
documented with a spot image. The portacatheter was then tested and
found to flush and aspirate well. The port was flushed with saline
followed by 100 units/mL heparinized saline.

The pocket was then closed in two layers using first subdermal
inverted interrupted absorbable sutures followed by a running
subcuticular suture. The epidermis was then sealed with Dermabond.
The dermatotomy at the venous access site was also seal with
Dermabond. Dav were placed.

We then proceeded with gastrostomy tube placement.

The epigastrium was prepped with Betadine in a sterile fashion, and
a sterile drape was applied covering the operative field. A sterile
gown and sterile gloves were used for the procedure.

A 5-French orogastric tube is placed under fluoroscopic guidance.
Scout imaging of the abdomen confirms barium within the transverse
colon.

The stomach was distended with gas. Under fluoroscopic guidance, an
18 gauge needle was utilized to puncture the anterior wall of the
body of the stomach. An Amplatz wire was advanced through the needle
passing a T fastener into the lumen of the stomach. The T fastener
was secured for gastropexy. A 9-French sheath was inserted.

A snare was advanced through the 9-French sheath. Biz Tiger was
advanced through the orogastric tube. It was snared then pulled out
the oral cavity, pulling the snare, as well. The leading edge of the
gastrostomy was attached to the snare. It was then pulled down the
esophagus and out the percutaneous site. It was secured in place.
Contrast was injected. No complication

Patient tolerated the procedure well and remained hemodynamically
stable throughout.

No complications encountered and no significant blood loss
encountered
IMPRESSION: Status post image guided right IJ port catheter and percutaneous
gastrostomy.

## 2023-12-09 IMAGING — CT CT NECK W/ CM
5 series · 16 of 33 positions shown, 18 images · IV contrast (agent unspecified)
Comparison: CT neck 08/19/2021

CLINICAL DATA: Cancer of nasopharynx and tonsillar fossa. Fever.
Worsening swelling and dysphagia.

EXAM:
CT NECK WITH CONTRAST
TECHNIQUE: Multidetector CT imaging of the neck was performed using the
standard protocol following the bolus administration of intravenous
contrast.

[Series 2: axial neck · axial · 0.55mm/px · z∈[+1204,+1318]mm · 2 of 173 slices shown]
[im 58/173  bone]
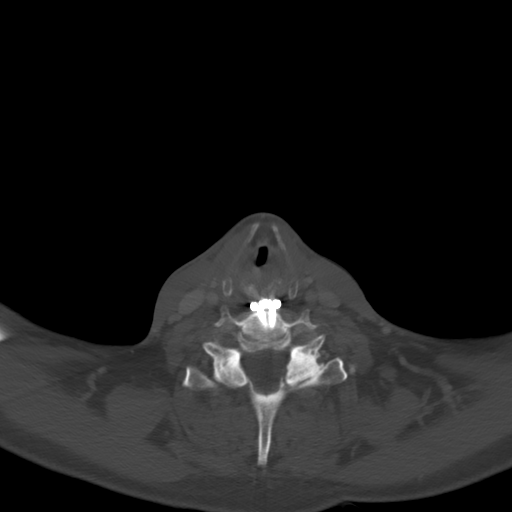
[im 115/173  bone]
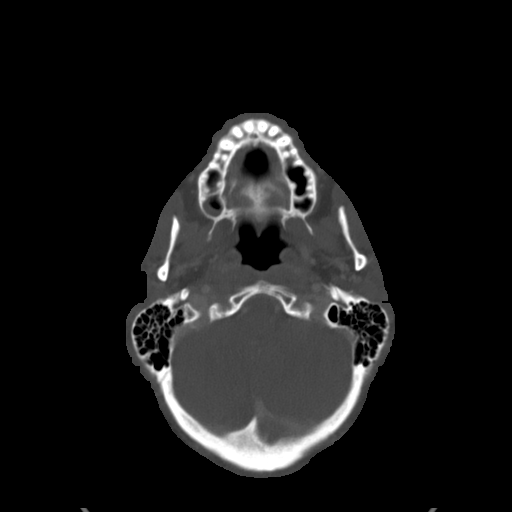

[Series 4: axial bone · axial · 0.55mm/px · z∈[+1176,+1348]mm · 3 of 173 slices shown, 4 images]
[im 44/173  soft-tissue]
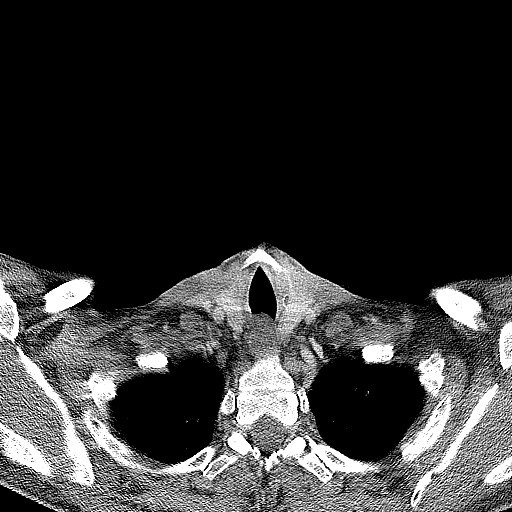
[im 44/173  bone]
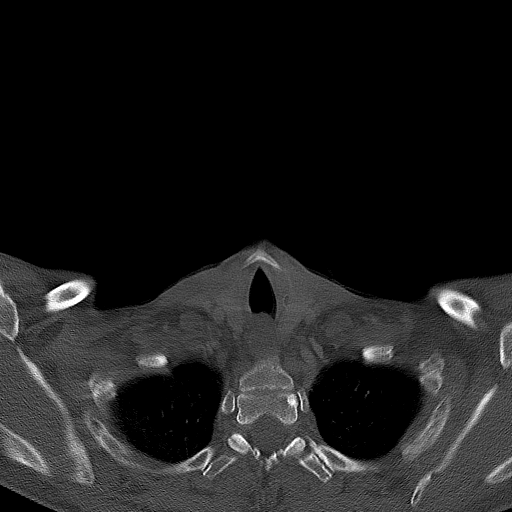
[im 87/173  bone]
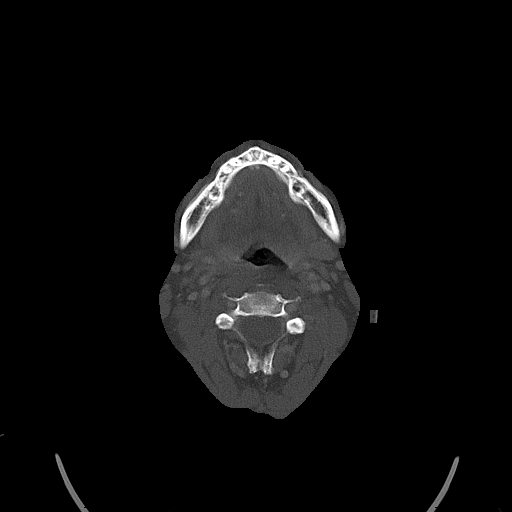
[im 130/173  bone]
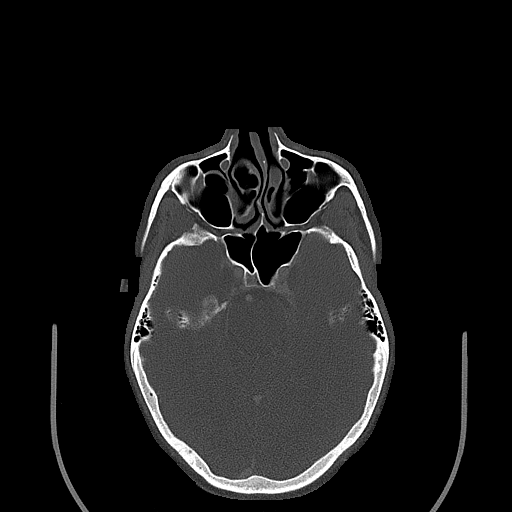

[Series 5: orthogonal (person_name) · axial · 0.57mm/px · z∈[+1177,+1349]mm · 3 of 173 slices shown]
[im 44/173  bone]
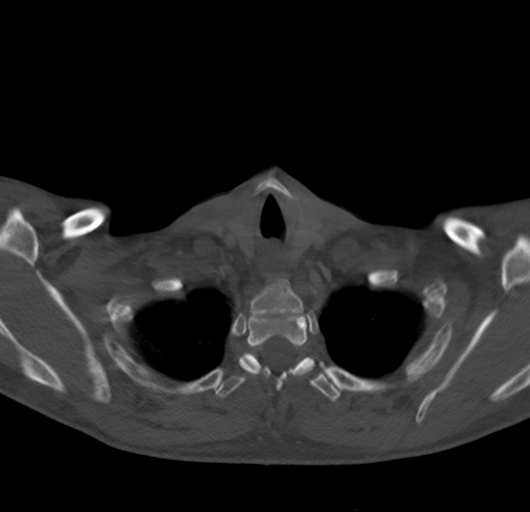
[im 87/173  bone]
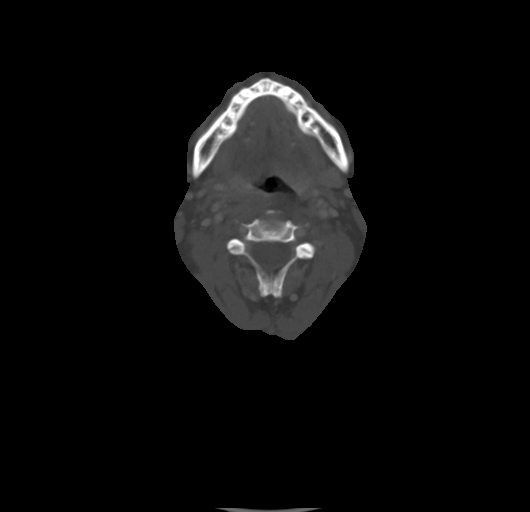
[im 130/173  bone]
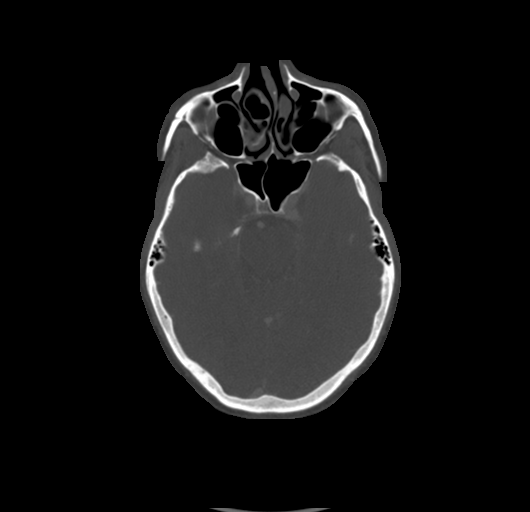

[Series 6: cor neck · coronal · 0.67mm/px · 3 of 147 slices shown]
[im 30/147  bone]
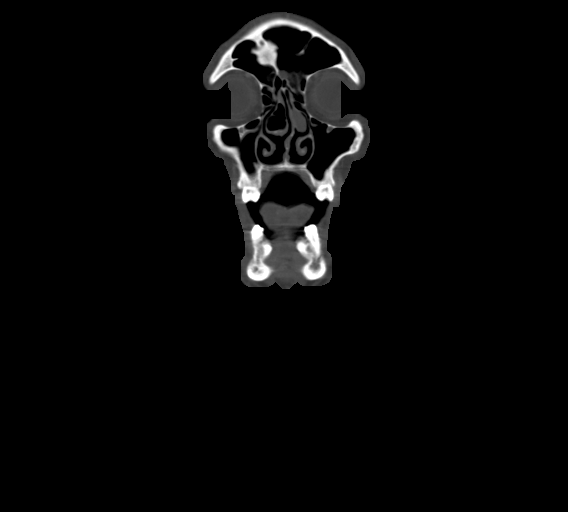
[im 59/147  bone]
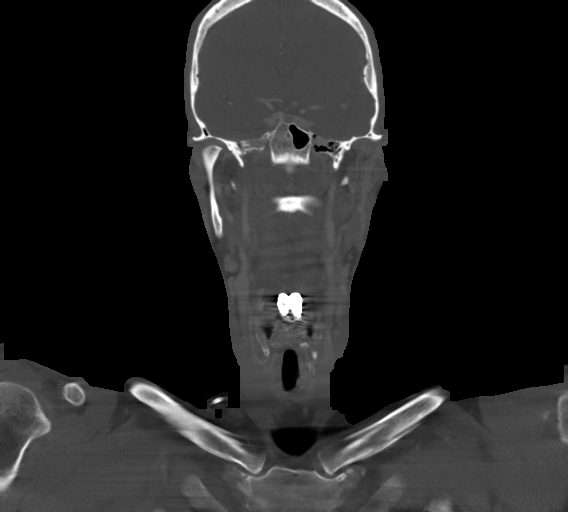
[im 88/147  bone]
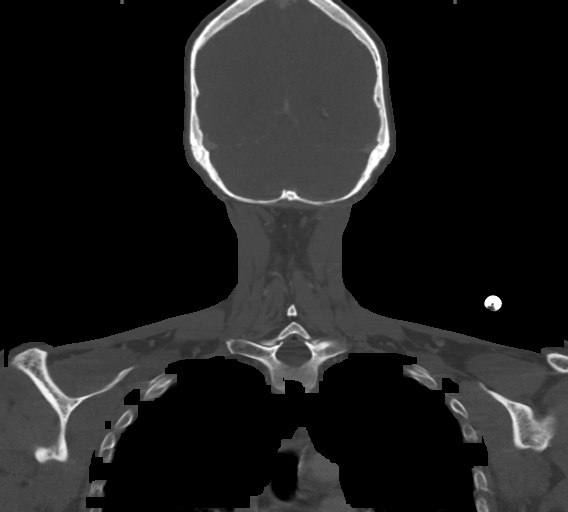

[Series 7: sag neck · sagittal · 0.64mm/px · 5 of 145 slices shown, 6 images]
[im 49/145  bone]
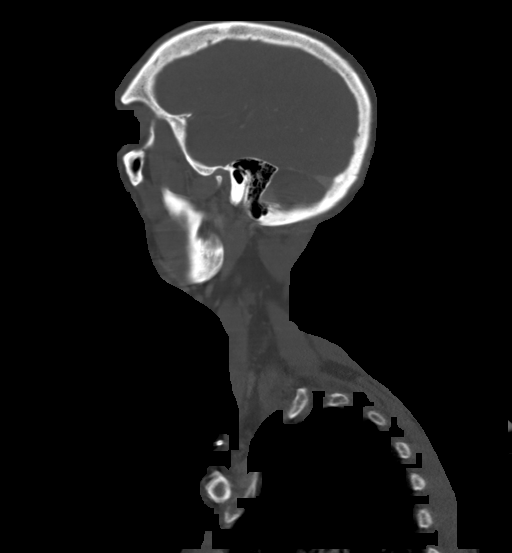
[im 61/145  bone]
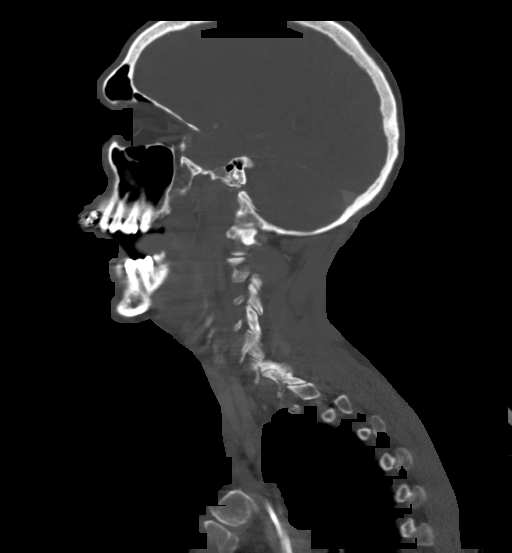
[im 73/145  soft-tissue]
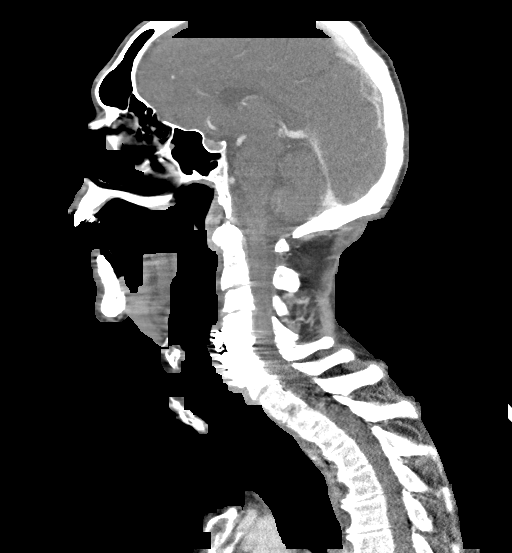
[im 73/145  bone]
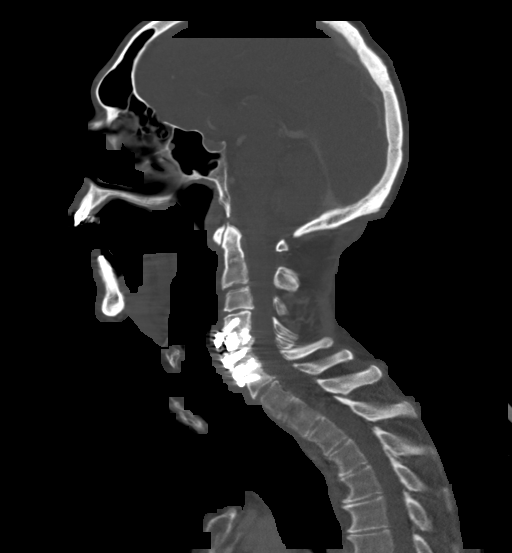
[im 85/145  bone]
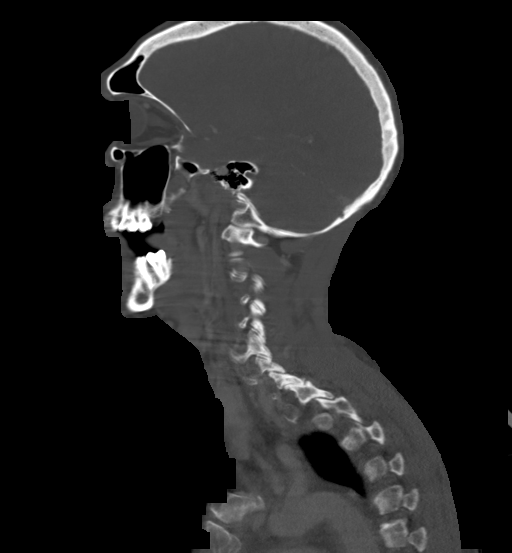
[im 97/145  bone]
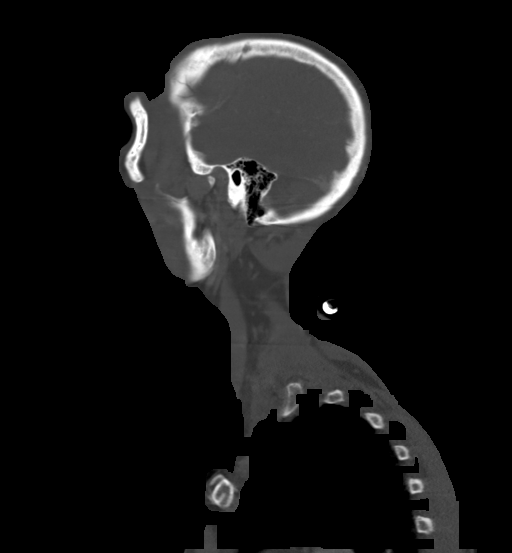

[16 of 33 positions shown; findings below may reference images not displayed]

RADIATION DOSE REDUCTION: This exam was performed according to the
departmental dose-optimization program which includes automated
exposure control, adjustment of the mA and/or kV according to
patient size and/or use of iterative reconstruction technique.

CONTRAST:  100mL OMNIPAQUE IOHEXOL 300 MG/ML  SOLN
FINDINGS: Pharynx and larynx: Diffuse mucosal edema in the pharynx likely due
to interval radiation. Masslike swelling in the right tonsil and
right lateral oropharynx has improved due to treated tumor.
Epiglottis and larynx normal. Airway intact.

Salivary glands: No inflammation, mass, or stone.

Thyroid: Negative

Lymph nodes: Interval improvement in right retropharyngeal lymph
node. Improvement in right-sided lymph nodes in the neck due to
treated tumor. No malignant nodes are present on today's study.

Vascular: Normal vascular enhancement. Port-A-Cath right jugular
vein.

Limited intracranial: Negative

Visualized orbits: Negative

Mastoids and visualized paranasal sinuses: Mild mucosal edema
paranasal sinuses. Mastoid clear bilaterally

Skeleton: Cervical spondylosis. ACDF C4 through C6. No acute
skeletal abnormality.

Upper chest: Lung apices clear bilaterally

Other: None
IMPRESSION: 1. No evidence of abscess or acute infection in the neck
2. Significant improvement in tumor in the right tonsil and
oropharynx. Improvement in retropharyngeal node on the right.
Improvement in right-sided lymph nodes.
